# Patient Record
Sex: Female | Born: 1945 | Race: White | Hispanic: No | Marital: Married | Smoking: Never smoker
Health system: Southern US, Community
[De-identification: ages and names within clinical notes are randomized; demographics above are authoritative.]

## PROBLEM LIST (undated history)

## (undated) DIAGNOSIS — C7931 Secondary malignant neoplasm of brain: Secondary | ICD-10-CM

## (undated) DIAGNOSIS — N1330 Unspecified hydronephrosis: Secondary | ICD-10-CM

## (undated) DIAGNOSIS — C7951 Secondary malignant neoplasm of bone: Secondary | ICD-10-CM

## (undated) DIAGNOSIS — N390 Urinary tract infection, site not specified: Secondary | ICD-10-CM

## (undated) DIAGNOSIS — C7800 Secondary malignant neoplasm of unspecified lung: Secondary | ICD-10-CM

## (undated) DIAGNOSIS — C50919 Malignant neoplasm of unspecified site of unspecified female breast: Secondary | ICD-10-CM

## (undated) DIAGNOSIS — D649 Anemia, unspecified: Secondary | ICD-10-CM

## (undated) HISTORY — DX: Unspecified hydronephrosis: N13.30

## (undated) HISTORY — DX: Anemia, unspecified: D64.9

## (undated) HISTORY — DX: Urinary tract infection, site not specified: N39.0

## (undated) HISTORY — DX: Secondary malignant neoplasm of unspecified lung: C78.00

## (undated) HISTORY — DX: Secondary malignant neoplasm of brain: C79.31

## (undated) HISTORY — DX: Malignant neoplasm of unspecified site of unspecified female breast: C50.919

## (undated) HISTORY — DX: Secondary malignant neoplasm of bone: C79.51

---

## 1979-06-05 HISTORY — PX: MOUTH SURGERY: SHX715

## 2011-06-05 HISTORY — PX: CATARACT EXTRACTION: SUR2

## 2013-10-22 ENCOUNTER — Other Ambulatory Visit: Payer: Self-pay | Admitting: Family Medicine

## 2013-10-22 DIAGNOSIS — N63 Unspecified lump in unspecified breast: Secondary | ICD-10-CM

## 2013-10-23 ENCOUNTER — Ambulatory Visit
Admission: RE | Admit: 2013-10-23 | Discharge: 2013-10-23 | Disposition: A | Discharge status: Home / Self Care | Payer: Medicare Other | Source: Ambulatory Visit | Attending: Family Medicine | Admitting: Family Medicine

## 2013-10-23 ENCOUNTER — Other Ambulatory Visit: Payer: Self-pay | Admitting: Family Medicine

## 2013-10-23 DIAGNOSIS — N63 Unspecified lump in unspecified breast: Secondary | ICD-10-CM

## 2013-10-27 ENCOUNTER — Ambulatory Visit
Admission: RE | Admit: 2013-10-27 | Discharge: 2013-10-27 | Disposition: A | Discharge status: Home / Self Care | Payer: Medicare Other | Source: Ambulatory Visit | Attending: Family Medicine | Admitting: Family Medicine

## 2013-10-27 ENCOUNTER — Other Ambulatory Visit: Payer: Self-pay | Admitting: Family Medicine

## 2013-10-27 DIAGNOSIS — N63 Unspecified lump in unspecified breast: Secondary | ICD-10-CM

## 2013-10-27 DIAGNOSIS — C50919 Malignant neoplasm of unspecified site of unspecified female breast: Secondary | ICD-10-CM

## 2013-10-28 ENCOUNTER — Telehealth: Payer: Self-pay | Admitting: *Deleted

## 2013-10-28 DIAGNOSIS — C50412 Malignant neoplasm of upper-outer quadrant of left female breast: Secondary | ICD-10-CM

## 2013-10-28 NOTE — Telephone Encounter (Signed)
Received call back from patient and confirmed [facility] appointment for 11/04/13 at 12pm.  Instructions and contact information given.

## 2013-10-28 NOTE — Telephone Encounter (Signed)
Left message for a return phone call to schedule patient for [facility] 11/04/13.  Awaiting patient response.

## 2013-11-02 ENCOUNTER — Ambulatory Visit
Admission: RE | Admit: 2013-11-02 | Discharge: 2013-11-02 | Disposition: A | Discharge status: Home / Self Care | Payer: Medicare Other | Source: Ambulatory Visit | Attending: Family Medicine | Admitting: Family Medicine

## 2013-11-02 DIAGNOSIS — C50919 Malignant neoplasm of unspecified site of unspecified female breast: Secondary | ICD-10-CM

## 2013-11-02 MED ORDER — GADOBENATE DIMEGLUMINE 529 MG/ML IV SOLN
11.0000 mL | Freq: Once | INTRAVENOUS | Status: AC | PRN
  Administered 2013-11-02: 11 mL via INTRAVENOUS

## 2013-11-04 ENCOUNTER — Encounter: Payer: Self-pay | Admitting: Specialist

## 2013-11-04 ENCOUNTER — Ambulatory Visit (HOSPITAL_BASED_OUTPATIENT_CLINIC_OR_DEPARTMENT_OTHER): Payer: Medicare Other | Admitting: Oncology

## 2013-11-04 ENCOUNTER — Other Ambulatory Visit: Payer: Self-pay | Admitting: *Deleted

## 2013-11-04 ENCOUNTER — Ambulatory Visit: Payer: Medicare Other | Attending: General Surgery | Admitting: Physical Therapy

## 2013-11-04 ENCOUNTER — Encounter: Payer: Self-pay | Admitting: Oncology

## 2013-11-04 ENCOUNTER — Ambulatory Visit
Admission: RE | Admit: 2013-11-04 | Discharge: 2013-11-04 | Disposition: A | Discharge status: Home / Self Care | Payer: Medicare Other | Source: Ambulatory Visit | Attending: Radiation Oncology | Admitting: Radiation Oncology

## 2013-11-04 ENCOUNTER — Ambulatory Visit (HOSPITAL_BASED_OUTPATIENT_CLINIC_OR_DEPARTMENT_OTHER): Payer: Medicare Other | Admitting: General Surgery

## 2013-11-04 ENCOUNTER — Ambulatory Visit: Payer: Medicare Other

## 2013-11-04 ENCOUNTER — Other Ambulatory Visit (HOSPITAL_BASED_OUTPATIENT_CLINIC_OR_DEPARTMENT_OTHER): Payer: Medicare Other

## 2013-11-04 ENCOUNTER — Telehealth: Payer: Self-pay | Admitting: Oncology

## 2013-11-04 VITALS — BP 152/93 | HR 103 | Temp 98.4°F | Resp 18 | Ht 66.0 in | Wt 123.4 lb

## 2013-11-04 DIAGNOSIS — C779 Secondary and unspecified malignant neoplasm of lymph node, unspecified: Secondary | ICD-10-CM

## 2013-11-04 DIAGNOSIS — C50412 Malignant neoplasm of upper-outer quadrant of left female breast: Secondary | ICD-10-CM

## 2013-11-04 DIAGNOSIS — C50419 Malignant neoplasm of upper-outer quadrant of unspecified female breast: Secondary | ICD-10-CM

## 2013-11-04 DIAGNOSIS — Z170 Estrogen receptor positive status [ER+]: Secondary | ICD-10-CM

## 2013-11-04 DIAGNOSIS — C50919 Malignant neoplasm of unspecified site of unspecified female breast: Secondary | ICD-10-CM

## 2013-11-04 DIAGNOSIS — C773 Secondary and unspecified malignant neoplasm of axilla and upper limb lymph nodes: Secondary | ICD-10-CM

## 2013-11-04 DIAGNOSIS — IMO0001 Reserved for inherently not codable concepts without codable children: Secondary | ICD-10-CM | POA: Insufficient documentation

## 2013-11-04 DIAGNOSIS — M25519 Pain in unspecified shoulder: Secondary | ICD-10-CM | POA: Diagnosis not present

## 2013-11-04 LAB — COMPREHENSIVE METABOLIC PANEL (CC13)
ALBUMIN: 3.6 g/dL (ref 3.5–5.0)
ALT: 59 U/L — ABNORMAL HIGH (ref 0–55)
AST: 119 U/L — ABNORMAL HIGH (ref 5–34)
Alkaline Phosphatase: 234 U/L — ABNORMAL HIGH (ref 40–150)
Anion Gap: 19 mEq/L — ABNORMAL HIGH (ref 3–11)
BILIRUBIN TOTAL: 0.64 mg/dL (ref 0.20–1.20)
BUN: 24.1 mg/dL (ref 7.0–26.0)
CO2: 19 mEq/L — ABNORMAL LOW (ref 22–29)
Calcium: 9.9 mg/dL (ref 8.4–10.4)
Chloride: 101 mEq/L (ref 98–109)
Creatinine: 1.3 mg/dL — ABNORMAL HIGH (ref 0.6–1.1)
Glucose: 115 mg/dl (ref 70–140)
Potassium: 3.9 mEq/L (ref 3.5–5.1)
SODIUM: 139 meq/L (ref 136–145)
Total Protein: 7.6 g/dL (ref 6.4–8.3)

## 2013-11-04 LAB — CBC WITH DIFFERENTIAL/PLATELET
BASO%: 1.3 % (ref 0.0–2.0)
Basophils Absolute: 0.1 10*3/uL (ref 0.0–0.1)
EOS ABS: 0 10*3/uL (ref 0.0–0.5)
EOS%: 0.7 % (ref 0.0–7.0)
HCT: 34.6 % — ABNORMAL LOW (ref 34.8–46.6)
HEMOGLOBIN: 11.2 g/dL — AB (ref 11.6–15.9)
LYMPH%: 19.2 % (ref 14.0–49.7)
MCH: 31.3 pg (ref 25.1–34.0)
MCHC: 32.3 g/dL (ref 31.5–36.0)
MCV: 96.9 fL (ref 79.5–101.0)
MONO#: 0.7 10*3/uL (ref 0.1–0.9)
MONO%: 12 % (ref 0.0–14.0)
NEUT#: 3.9 10*3/uL (ref 1.5–6.5)
NEUT%: 66.8 % (ref 38.4–76.8)
Platelets: 396 10*3/uL (ref 145–400)
RBC: 3.57 10*6/uL — AB (ref 3.70–5.45)
RDW: 14.6 % — ABNORMAL HIGH (ref 11.2–14.5)
WBC: 5.9 10*3/uL (ref 3.9–10.3)
lymph#: 1.1 10*3/uL (ref 0.9–3.3)

## 2013-11-04 MED ORDER — LORAZEPAM 0.5 MG PO TABS: 0.5000 mg | ORAL_TABLET | Freq: Once | ORAL | Status: DC

## 2013-11-04 NOTE — Progress Notes (Signed)
[place]  [phone] Fax:(336) [phone]     ID: [name] OB: Dec 28, 1945  MR#: [id number]  KDP#:[id number]  PCP: Lilian Coma, MD GYN:   SU: Excell Seltzer OTHER MD: Arloa Koh, Altamese Cabal, Scot Minor DDS  CHIEF COMPLAINT: "I have breast cancer"  BREAST CANCER HISTORY: Lynnel noted a change in her left breast approximately mid 2014. This was a flattening of the lateral aspect of the left breast. Eventually she noted an indentation and eventually a "sore" in that area. She had not shared this information with her husband, and she did not have a primary care physician. More recently, as she started to have symptoms related to this, she mentioned it to her husband Vicki Marshall and she establish yourself with Dr. Stephanie Acre, who set her up for bilateral diagnostic mammography and ultrasonography at the [facility] 10/23/2013.. This showed a breast density to be category C. The right breast was negative.  The left breast contained an ulcerated mass laterally measuring at least 4 cm mammography. On physical exam there was an ulcerated mass in the 2:00 location of the left breast, associated with erythematous skin nodules in the upper inner quadrant. The left breast was smaller than the right, and firm. There were palpable left axillary lymph nodes. In addition, on the right upper chest wall, infraclavicular like, there was a firm nodule separate from the bony structures. There was also a right preauricular node and a firm left submandibular node.  Ultrasound showed multiple nodules throughout the lower portion of the left breast. There was a large mass in the upper outer quadrant of the left breast contiguous with the skin ulceration, measuring greater than 5 cm. The left axilla showed at least 3 suspicious masses, the largest measuring 1.9 cm. Ultrasound of the palpable abnormality of the right chest wall showed a superficial irregular mass, subdermal,  measuring 0.8 cm.  Biopsies of the left breast mass, a left axillary lymph node, and the right infra-clavicular mass on 10/23/2013, all showed ([id number]) and invasive ductal carcinoma, grade 1 or 2, estrogen receptor 100% positive, progesterone receptor 23% positive, both with strong staining intensity, with an MIB-1 of 61%, and no HER-2 amplification, the signals ratio being 0.90, and the number per cell 1.80.  On 11/02/2013 the patient underwent bilateral breast MRI and this showed, in the left breast, a 5.8 cm mass eroding through the overlying skin, and also invading the underlying pectoralis muscle and chest wall. There were multiple enhancing nodules throughout the left breast and an adjacent 4.8 cm irregular enhancing mass inferiorly. There are multiple large irregular left axillary lymph nodes, the largest measuring 2.4 cm. There was a 0.9 cm left subpectoral lymph node as well as prevascular and precarinal lymph nodes.  In the right breast far upper inner quadrant there was a 0.8 cm irregular enhancing mass abutting the overlying skin. This is the mass that was biopsied and shown to be positive. In addition there was patchy heterogeneous enhancement of the sternum.  The patient's subsequent history is as detailed below  INTERVAL HISTORY: Vicki Marshall was evaluated in the multidisciplinary breast cancer clinic 11/04/2013 accompanied by her husband Vicki Marshall. Her case was also discussed in breast conference at that same morning  REVIEW OF SYSTEMS: Aside from problems directly related to the left breast mass and skin involvement, she has lost approximately 5 pounds over the last 2 months. She feels a little bit more fatigued than before. She continues all her daily activities, however. She has  slept on 2 pillows for several years. She says her appetite has been poor, but denies nausea vomiting or significant taste perversion. There have been some visual changes. Otherwise a detailed review of systems  today was noncontributory   PAST MEDICAL HISTORY: Past Medical History  Diagnosis Date  . Urinary tract infection   . Anemia     PAST SURGICAL HISTORY: Past Surgical History  Procedure Laterality Date  . Cataract extraction  2013  . Mouth surgery  1981    FAMILY HISTORY: The patient's father died at the age of 32, from pneumonia. The patient's mother lived to be 33. The patient had no brothers, one sister. There is no history of breast or ovarian cancer in the family  GYNECOLOGIC HISTORY:  Menarche age 44. The patient is GX P0. She stopped having periods approximately 2003. She did not take hormone replacement. She used birth control pills for approximately 8 years remotely, with no complications   SOCIAL HISTORY:  Rush Barer is retired about a year ago. 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 Her husband Vicki Marshall is retired from working in Char[employer] fundraiser. They live alone, with no pets.    ADVANCED DIRECTIVES: Not in place   HEALTH MAINTENANCE: History  Substance Use Topics  . Smoking status: Never Smoker   . Smokeless tobacco: Not on file  . Alcohol Use: Yes     Comment: 10 12 glasses/week     Colonoscopy: Never  PAP: 1990  Bone density: Never  Lipid panel:  No Known Allergies  Current Outpatient Prescriptions  Medication Sig Dispense Refill  . aspirin 81 MG EC tablet Take 81 mg by mouth daily. Swallow whole.       No current facility-administered medications for this visit.    OBJECTIVE: Middle-aged white woman in no acute distress  Filed Vitals:   11/04/13 1300  BP: 152/93  Pulse: 103  Temp: 98.4 F (36.9 C)  Resp: 18     Body mass index is 19.93 kg/(m^2).    ECOG FS:1 - Symptomatic but completely ambulatory  Ocular: Sclerae unicteric, pupils equal, round and reactive to light Ear-nose-throat: Oropharynx clear,  followup replacement, partial lower plate  Lymphatic:  there is a 2 cm subcutaneous mass in the right anterior cervical  triangle, likely a subcutaneous met  Lungs no rales or rhonchi, good excursion bilaterally Heart regular rate and rhythm, no murmur appreciated Abd soft, nontender, positive bowel sounds MSK no focal spinal tenderness, no joint edema Neuro: non-focal, well-oriented, appropriate affect Breasts: I do not palpate any masses in the right breast itself. There are no skin or nipple changes of concern. The right axilla is benign. The left breast is considerably smaller than the right. It is not easily movable. There is dimpling and indentation of the lateral silhouette, associated with an erythematous nodule measuring approximately 2-1/2 cm in the upper outer quadrant. There is fixed left axillary adenopathy.   LAB RESULTS:  CMP     Component Value Date/Time   NA 139 11/04/2013 1217   K 3.9 11/04/2013 1217   CO2 19* 11/04/2013 1217   GLUCOSE 115 11/04/2013 1217   BUN 24.1 11/04/2013 1217   CREATININE 1.3* 11/04/2013 1217   CALCIUM 9.9 11/04/2013 1217   PROT 7.6 11/04/2013 1217   ALBUMIN 3.6 11/04/2013 1217   AST 119* 11/04/2013 1217   ALT 59* 11/04/2013 1217   ALKPHOS 234* 11/04/2013 1217   BILITOT 0.64 11/04/2013 1217    I No results found  for this basename: SPEP, UPEP,  kappa and lambda light chains    Lab Results  Component Value Date   WBC 5.9 11/04/2013   NEUTROABS 3.9 11/04/2013   HGB 11.2* 11/04/2013   HCT 34.6* 11/04/2013   MCV 96.9 11/04/2013   PLT 396 11/04/2013      Chemistry      Component Value Date/Time   NA 139 11/04/2013 1217   K 3.9 11/04/2013 1217   CO2 19* 11/04/2013 1217   BUN 24.1 11/04/2013 1217   CREATININE 1.3* 11/04/2013 1217      Component Value Date/Time   CALCIUM 9.9 11/04/2013 1217   ALKPHOS 234* 11/04/2013 1217   AST 119* 11/04/2013 1217   ALT 59* 11/04/2013 1217   BILITOT 0.64 11/04/2013 1217       No results found for this basename: LABCA2    No components found with this basename: LABCA125    No results found for this basename: INR,  in the last 168 hours  Urinalysis No results  found for this basename: colorurine, appearanceur, labspec, phurine, glucoseu, hgbur, bilirubinur, ketonesur, proteinur, urobilinogen, nitrite, leukocytesur    STUDIES: Mr Breast Bilateral W Wo Contrast  11/02/2013   CLINICAL DATA:  Patient with recent diagnosis left breast carcinoma and metastatic left axillary lymphadenopathy. Additionally there is a palpable nodule within the right chest wall which demonstrated invasive ductal carcinoma.  EXAM: BILATERAL BREAST MRI WITH AND WITHOUT CONTRAST  TECHNIQUE: Multiplanar, multisequence MR images of both breasts were obtained prior to and following the intravenous administration of 58m of MultiHance.  THREE-DIMENSIONAL MR IMAGE RENDERING ON INDEPENDENT WORKSTATION:  Three-dimensional MR images were rendered by post-processing of the original MR data on an independent workstation. The three-dimensional MR images were interpreted, and findings are reported in the following complete MRI report for this study. Three dimensional images were evaluated at the independent DynaCad workstation  COMPARISON:  Previous exams  FINDINGS: Breast composition: c:  Heterogeneous fibroglandular tissue  Background parenchymal enhancement: Mild  Right breast: Within the far upper inner aspect of the right breast there is a 0.8 x 0.8 cm irregular enhancing mass which appears to abut the overlying skin and underlying muscle. This mass was previously biopsied and compatible with invasive ductal carcinoma.  No additional concerning enhancing masses identified within the right breast.  Left breast: Within the lateral aspect of the left breast there is a 5.8 x 4.8 x 4.6 cm heterogeneously enhancing mass with associated susceptibility artifact from biopsy marking clip compatible with biopsy-proven left breast carcinoma. This mass invades and erodes through the overlying skin. Additionally the posterior margin of this mass invades the underlying pectoralis muscle and chest wall.  There are  multiple additional enhancing nodules demonstrated throughout the left breast as well as involving the skin of the left breast. Reference skin nodule measures 3.0 x 0.9 cm (image 68 ; series 6). Along the inferior margin of dominant mass is an adjacent 4.8 x 1.4 x 2.8 cm irregular enhancing mass compatible with additional site of disease.  Along the inferior lateral margin of the breast there is an additional 1.4 x 0.7 cm irregular enhancing mass which appears to involve the underlying pectoralis muscle. There is an additional enhancing mass which appears to involve the pectoralis muscle within the far inferior left lateral aspect of the breast measuring 9 mm (image 141; series 7).  Extensive left breast skin thickening and edema.  Lymph nodes: Multiple enlarged irregular left axillary lymph nodes. A dominant left axillary lymph node  measures 2.4 x 1.9 cm. There is 0.9 cm left subpectoral lymph node. Note is made of a 1 cm indeterminate prevascular lymph node. There is a 1 cm precarinal lymph node (image 64; series 12).  Ancillary findings: Ectasia of ascending thoracic aorta measuring 3.8 cm. Patchy heterogeneous enhancement of the sternum.  IMPRESSION: 1. Large dominant irregular enhancing left breast mass which invades and erodes through the overlying skin and involves the underlying pectoralis muscle/chest wall compatible with biopsy-proven carcinoma. 2. Multiple additional enhancing nodules throughout the left breast, involving the skin of the left breast and the left pectoralis muscle compatible with additional sites of disease. 3. Bulky enlarged left axillary lymphadenopathy compatible with biopsy-proven metastatic adenopathy. 4. Irregular enhancing mass within the far superior aspect of the right breast/chest wall involving the overlying skin and underlying pectoralis muscle compatible with biopsy-proven carcinoma. 5. Heterogeneous patchy enhancement of the sternum, osseous metastasis not excluded. 6.  Borderline enlarged mediastinal lymphadenopathy. 7. Ectasia of the ascending thoracic aorta.  RECOMMENDATION: 1. Treatment plan for metastatic left breast carcinoma and metastatic nodule within the superior aspect of the right breast/chest wall. 2. PET/CT for evaluation of distant metastatic disease, confirmation of borderline enlarged mediastinal lymph nodes and for evaluation of possible osseous metastasis.  BI-RADS CATEGORY  6: Known biopsy-proven malignancy.   Electronically Signed   By: Lovey Newcomer M.D.   On: 11/02/2013 16:57   Mm Digital Diagnostic Bilat  10/23/2013   CLINICAL DATA:  Ulcerating mass in the left breast. This has been present for some time, initially noticed as a mass in the upper-outer quadrant.  EXAM: DIGITAL DIAGNOSTIC  BILATERAL MAMMOGRAM WITH CAD  ULTRASOUND BILATERAL BREAST  COMPARISON:  None.  ACR Breast Density Category c: The breast tissue is heterogeneously dense, which may obscure small masses.  FINDINGS: Right breast is negative.  Left breast is small A contains an ulcerated mass in the lateral aspect. A partially imaged lobulated mass measures at least 4 cm mammographically.  Mammographic images were processed with CAD.  On physical exam, there is an ulcerating mass in the 2 o'clock location of the left breast approximately 4 cm in diameter. Erythematous skin nodules are identified in the upper inner quadrant of the breast. Left breast is small and diffusely firm. I palpate left axillary lymph nodes.  On the right upper chest wall, in the infraclavicular region there is a firm nodule discrete from the bony structures. I palpate a firm right preauricular node and a firm left submandibular node.  Ultrasound is performed, showing multiple nodules throughout the lower portion of the left breast, heterogeneous in appearance. A large discrete vascular mass is identified in the upper-outer quadrant of the left breast, contiguous with the skin ulceration and measuring greater than 5 cm.  Evaluation of the left axilla shows 3 suspicious masses. The largest of these measures 1.9 x 1.5 x 1.3 cm.  Ultrasound of the palpable abnormality on the right chest wall shows a superficial irregular mass, subdermal in location and superficial to the pectoralis muscle. This measures 0.7 x 0.6 x 0.8 cm. Findings are suspicious for metastasis.  IMPRESSION: 1. Findings consistent with advanced left breast cancer. 2. Suspect left axillary metastatic disease. 3. Suspect right chest wall metastasis.  RECOMMENDATION: 1. Recommend ultrasound-guided core biopsy of mass in the left breast. 2. Recommend ultrasound-guided core biopsy of left axillary lymph node. 3. Recommend ultrasound-guided core biopsy of right chest wall mass for staging. 4. Biopsies are performed on the same day and dictated separately.  I have discussed the findings and recommendations with the patient and her husband. Results were also provided in writing at the conclusion of the visit. If applicable, a reminder letter will be sent to the patient regarding the next appointment.  BI-RADS CATEGORY  5: Highly suggestive of malignancy.   Electronically Signed   By: Shon Hale M.D.   On: 10/23/2013 13:31   Mm Digital Diagnostic Unilat L  10/23/2013   CLINICAL DATA:  Status post ultrasound-guided core biopsy of mass in the 2 o'clock location of the left breast.  EXAM: DIAGNOSTIC LEFT MAMMOGRAM POST ULTRASOUND BIOPSY  COMPARISON:  10/23/2013  FINDINGS: Mammographic images were obtained following ultrasound guided biopsy of mass in the 2 o'clock location of the left breast. A coil shaped clip is identified in the upper outer quadrant as expected.  IMPRESSION: 1. Tissue marker clip is in the expected location following ultrasound-guided core biopsy. 2. Left axillary node was biopsied but not marked with a clip. Right chest wall lesion was biopsied but not marked with a clip.  Final Assessment: Post Procedure Mammograms for Marker Placement   Electronically  Signed   By: Shon Hale M.D.   On: 10/23/2013 13:54   Mm Radiologist Eval And Mgmt  10/27/2013   EXAM: ESTABLISHED PATIENT OFFICE VISIT - LEVEL II ([id number])  CHIEF COMPLAINT: The patient returns for results following bilateral ultrasound-guided core biopsies.  Current Pain Level: 3  HISTORY OF PRESENT ILLNESS: The patient has noted a palpable left breast mass for some time. Recently this has become ulcerated. There is no family history of breast cancer or ovarian cancer.  EXAM: There is a firm palpable mass with ulceration located within the left breast at the 2 o'clock position. There is also palpable left axillary adenopathy and a small palpable mass within the right infraclavicular region. As per Dr. Saul Fordyce note there are small palpable masses in the right preauricular region and left submandibular region.  The biopsy sites within the left breast, left axilla, and superior right chest wall are examined. There is no hematoma formation or signs of infection. There is mild ecchymosis associated with these sites.  PATHOLOGY: The pathology associated with the left breast mass located at the 2 o'clock position demonstrated grade 1-2 invasive ductal carcinoma. The left axillary lymph node biopsy is consistent with metastatic invasive ductal carcinoma and the right chest wall ultrasound-guided core biopsy is also consistent with a metastatic deposit.  ASSESSMENT AND PLAN: ASSESSMENT AND PLAN I discussed the findings with the patient and her husband and answered their questions. The patient is scheduled for the breast cancer multi disciplinary clinic on 11/04/2013. Breast MRI is currently planned for 11/02/2013. The patient was encouraged to call our breast center for additional questions or concerns. Post biopsy wound care instructions were reviewed with the patient and her husband. Educational materials were shared with the patient.   Electronically Signed   By: Luberta Robertson M.D.   On: 10/27/2013 15:57   US  Breast Ltd Uni Left Inc Axilla  10/23/2013   CLINICAL DATA:  Ulcerating mass in the left breast. This has been present for some time, initially noticed as a mass in the upper-outer quadrant.  EXAM: DIGITAL DIAGNOSTIC  BILATERAL MAMMOGRAM WITH CAD  ULTRASOUND BILATERAL BREAST  COMPARISON:  None.  ACR Breast Density Category c: The breast tissue is heterogeneously dense, which may obscure small masses.  FINDINGS: Right breast is negative.  Left breast is small A contains an ulcerated mass in the lateral  aspect. A partially imaged lobulated mass measures at least 4 cm mammographically.  Mammographic images were processed with CAD.  On physical exam, there is an ulcerating mass in the 2 o'clock location of the left breast approximately 4 cm in diameter. Erythematous skin nodules are identified in the upper inner quadrant of the breast. Left breast is small and diffusely firm. I palpate left axillary lymph nodes.  On the right upper chest wall, in the infraclavicular region there is a firm nodule discrete from the bony structures. I palpate a firm right preauricular node and a firm left submandibular node.  Ultrasound is performed, showing multiple nodules throughout the lower portion of the left breast, heterogeneous in appearance. A large discrete vascular mass is identified in the upper-outer quadrant of the left breast, contiguous with the skin ulceration and measuring greater than 5 cm. Evaluation of the left axilla shows 3 suspicious masses. The largest of these measures 1.9 x 1.5 x 1.3 cm.  Ultrasound of the palpable abnormality on the right chest wall shows a superficial irregular mass, subdermal in location and superficial to the pectoralis muscle. This measures 0.7 x 0.6 x 0.8 cm. Findings are suspicious for metastasis.  IMPRESSION: 1. Findings consistent with advanced left breast cancer. 2. Suspect left axillary metastatic disease. 3. Suspect right chest wall metastasis.  RECOMMENDATION: 1. Recommend  ultrasound-guided core biopsy of mass in the left breast. 2. Recommend ultrasound-guided core biopsy of left axillary lymph node. 3. Recommend ultrasound-guided core biopsy of right chest wall mass for staging. 4. Biopsies are performed on the same day and dictated separately.  I have discussed the findings and recommendations with the patient and her husband. Results were also provided in writing at the conclusion of the visit. If applicable, a reminder letter will be sent to the patient regarding the next appointment.  BI-RADS CATEGORY  5: Highly suggestive of malignancy.   Electronically Signed   By: Shon Hale M.D.   On: 10/23/2013 13:31   US Breast Ltd Uni Right Inc Axilla  10/23/2013   CLINICAL DATA:  Ulcerating mass in the left breast. This has been present for some time, initially noticed as a mass in the upper-outer quadrant.  EXAM: DIGITAL DIAGNOSTIC  BILATERAL MAMMOGRAM WITH CAD  ULTRASOUND BILATERAL BREAST  COMPARISON:  None.  ACR Breast Density Category c: The breast tissue is heterogeneously dense, which may obscure small masses.  FINDINGS: Right breast is negative.  Left breast is small A contains an ulcerated mass in the lateral aspect. A partially imaged lobulated mass measures at least 4 cm mammographically.  Mammographic images were processed with CAD.  On physical exam, there is an ulcerating mass in the 2 o'clock location of the left breast approximately 4 cm in diameter. Erythematous skin nodules are identified in the upper inner quadrant of the breast. Left breast is small and diffusely firm. I palpate left axillary lymph nodes.  On the right upper chest wall, in the infraclavicular region there is a firm nodule discrete from the bony structures. I palpate a firm right preauricular node and a firm left submandibular node.  Ultrasound is performed, showing multiple nodules throughout the lower portion of the left breast, heterogeneous in appearance. A large discrete vascular mass is  identified in the upper-outer quadrant of the left breast, contiguous with the skin ulceration and measuring greater than 5 cm. Evaluation of the left axilla shows 3 suspicious masses. The largest of these measures 1.9 x 1.5 x 1.3 cm.  Ultrasound of  the palpable abnormality on the right chest wall shows a superficial irregular mass, subdermal in location and superficial to the pectoralis muscle. This measures 0.7 x 0.6 x 0.8 cm. Findings are suspicious for metastasis.  IMPRESSION: 1. Findings consistent with advanced left breast cancer. 2. Suspect left axillary metastatic disease. 3. Suspect right chest wall metastasis.  RECOMMENDATION: 1. Recommend ultrasound-guided core biopsy of mass in the left breast. 2. Recommend ultrasound-guided core biopsy of left axillary lymph node. 3. Recommend ultrasound-guided core biopsy of right chest wall mass for staging. 4. Biopsies are performed on the same day and dictated separately.  I have discussed the findings and recommendations with the patient and her husband. Results were also provided in writing at the conclusion of the visit. If applicable, a reminder letter will be sent to the patient regarding the next appointment.  BI-RADS CATEGORY  5: Highly suggestive of malignancy.   Electronically Signed   By: Shon Hale M.D.   On: 10/23/2013 13:31   [place] Lt Breast Bx W Loc Dev 1st Lesion Img Bx Spec US Guide  10/23/2013   CLINICAL DATA:  Left breast mass.  EXAM: ULTRASOUND GUIDED LEFT BREAST CORE NEEDLE BIOPSY WITH VACUUM ASSIST  COMPARISON:  10/23/2013  PROCEDURE: I met with the patient and we discussed the procedure of ultrasound-guided biopsy, including benefits and alternatives. We discussed the high likelihood of a successful procedure. We discussed the risks of the procedure including infection, bleeding, tissue injury, clip migration, and inadequate sampling. Informed written consent was given. The usual time-out protocol was performed immediately prior to the  procedure.  Using sterile technique and 2% Lidocaine as local anesthetic, under direct ultrasound visualization, a 12 gauge vacuum-assisteddevice was used to perform biopsy of mass in the 2 o'clock location of the left breast using a lateral approach. At the conclusion of the procedure, a coil shaped tissue marker clip was deployed into the biopsy cavity. Follow-up 2-view mammogram was performed and dictated separately.  IMPRESSION: Ultrasound-guided biopsy of left breast mass. No apparent complications.  Left axillary mass and right chest wall mass are also biopsied on the same day and dictated separately.   Electronically Signed   By: Shon Hale M.D.   On: 10/23/2013 13:50   [place] Lt Breast Bx W Loc Dev Ea Add Lesion Img Bx Spec US Guide  10/23/2013   CLINICAL DATA:  Left breast mass and enlarged left axillary lymph nodes.  EXAM: ULTRASOUND GUIDED CORE NEEDLE BIOPSY OF A LEFT AXILLARY NODE  COMPARISON:  10/23/2013  FINDINGS: I met with the patient and we discussed the procedure of ultrasound-guided biopsy, including benefits and alternatives. We discussed the high likelihood of a successful procedure. We discussed the risks of the procedure, including infection, bleeding, tissue injury, clip migration, and inadequate sampling. Informed written consent was given. The usual time-out protocol was performed immediately prior to the procedure.  Using sterile technique and 2% Lidocaine as local anesthetic, under direct ultrasound visualization, a 14 gauge spring-loaded device was used to perform biopsy of the lower most left axillary lymph node using a lateral approach. Biopsy site was not marked with a clip.  IMPRESSION: 1. Ultrasound guided biopsy of left axillary lymph node. No apparent complications. 2. Left breast mass and right chest wall mass are also biopsied on the same day, dictated separately.   Electronically Signed   By: Shon Hale M.D.   On: 10/23/2013 13:52   [place] Rt Breast Bx W Loc Dev 1st Lesion Img  Bx Spec  US Guide  10/23/2013   CLINICAL DATA:  Right chest wall mass.  EXAM: ULTRASOUND GUIDED RIGHT BREAST CORE NEEDLE BIOPSY  COMPARISON:  10/23/2013  FINDINGS: I met with the patient and we discussed the procedure of ultrasound-guided biopsy, including benefits and alternatives. We discussed the high likelihood of a successful procedure. We discussed the risks of the procedure, including infection, bleeding, tissue injury, clip migration, and inadequate sampling. Informed written consent was given. The usual time-out protocol was performed immediately prior to the procedure.  Using sterile technique and 2% Lidocaine as local anesthetic, under direct ultrasound visualization, a 14 gauge spring-loaded device was used to perform biopsy of mass in the infraclavicular region of the right chest wall using a median approach. The lesion was not marked with a clip at the conclusion of biopsy.  IMPRESSION: 1. Ultrasound guided biopsy of right chest wall mass. No apparent complications. 2. Left breast mass and left axillary mass are also biopsied on the same day, dictated separately.   Electronically Signed   By: Shon Hale M.D.   On: 10/23/2013 13:53    ASSESSMENT: 68 y.o. [place] woman status post left breast in left axillary lymph node biopsy as well as right infraclavicular chest wall biopsy 10/23/2013 for a clinical T3, N2, M1, stage IV invasive ductal carcinoma, grade 2, estrogen receptor 100% positive, progesterone receptor 23% positive, with an MIB-1 of 61%, and no HER-2 amplification.  PLAN: We spent the better part of today's hour-long appointment discussing the biology of breast cancer in general, and the specifics of the patient's tumor in particular. [name] understands at this point she almost certainly has stage IV, metastatic disease. The right infraclavicular biopsy and establishes that. The real question however is whether there is involvement of the liver lungs, or other major organs,  including the brain.  unfortunately the elevated liver function tests do suggest liver involvement. Accordingly she is being set up for a PET scan and brain MRI.  We discussed the fact that stage IV breast cancer is not curable at present. He can be successfully treated and controlled sometimes for long periods of time. Systemic treatment takes precedence in her case. Surgery at this point is not possible. I am hopeful it will be possible at some point in the future and then of course  she would need postmastectomy radiation.  The big decision is whether to start with chemotherapy or start with anti-estrogens. That will depend on her staging studies. If there is significant involvement of a vital organ, or visceral crisis, we will with chemotherapy, and after obtaining good control switching to anti-estrogens. Otherwise we will start with letrozole and palbociclib Leslee Home). We have set up the appropriate studies and [name] is going to return to see me June 12 to discuss results and make a definitive decision.  She has a good understanding of the overall plan. She agrees with it. She knows the goal of treatment in her case is control. She will call with any problems that may develop before her next visit here.   Chauncey Cruel, MD   11/04/2013 2:20 PM

## 2013-11-04 NOTE — Telephone Encounter (Signed)
Pt has her schedule with the pet scan mri brain appt and the f/u vist with dr Jana Hakim

## 2013-11-04 NOTE — Progress Notes (Signed)
Patient and hubby came by. I explained the [place] and once plan of care is given--could be asst. They will call me if any issues after insurance pays

## 2013-11-04 NOTE — Progress Notes (Signed)
Saw patient in [facility]. Went over the distress screen with her as well as providing education on the support center programs and services. Patient said most of her distress was related to lack of information about the treatment and she felt much better after the clinic today.  She felt she had what she needed after today.  Vicki Gore, PhD, [place]

## 2013-11-04 NOTE — Progress Notes (Signed)
[facility] NEW PATIENT EVALUATION  Name: Vicki Marshall MRN: [id number]  Date:   11/04/2013           DOB: 07-08-1945  Status: outpatient   CC: Lilian Coma, MD  Hox[place], Darene Lamer, MD    REFERRING PHYSICIAN: Excell Seltzer Darene Lamer, MD   DIAGNOSIS: Stage IV (T4c, N2, M1) invasive ductal carcinoma of the left breast   HISTORY OF PRESENT ILLNESS:  Vicki Marshall is a 68 y.o. female who is seen at the [facility] through the courtesy of Dr. 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 She states that she first noted changes along her left breast one to 2 years ago. She was referred to the [facility] by Dr. Jonathon Jordan where she underwent mammography and ultrasonography. She was noted to have an ulcerating mass along the left aspect of the breast at approximately 2:00. Erythematous skin nodules were noted along the upper-outer quadrant, and there were fixed left axillary lymph nodes. There is also a small nodule noted along the right infraclavicular region in addition to a right preauricular and left submandibular node. Ultrasound showed a 5 cm mass with contiguous skin ulceration. There were multiple axillary masses the largest of which measured 1.9 x 1.5 x 1.3 cm. The left breast mass and axilla were biopsied on 10/23/2013 and these were diagnostic for invasive ductal carcinoma. The right chest wall, infraclavicular nodular was also biopsied and found to represent invasive ductal carcinoma. The tumor was ER/PR positive and HER-2/neu negative. Proliferation index was 61%. Breast MR on 11/02/2013 showed a large dominant mass within the left breast invading and eroding through the overlying skin and also involving the underlying pectoralis muscle/chest wall. There were multiple enhancing nodules throughout the left breast involving the skin the left breast and also a left pectoralis muscle. Bulky left axillary adenopathy was  seen. An irregular enhancing mass was seen within the far superior aspect of the right breast/chest wall involving overlying skin and underlying pectoralis muscle. Patchy enhancement was seen along the sternum suspicious for metastatic disease. She denies chest wall/breast pain. She reports moderate fatigue for the past 3-5 months along with a 5-6 pound weight loss over the past month.  PREVIOUS RADIATION THERAPY: No   PAST MEDICAL HISTORY:  has a past medical history of Urinary tract infection and Anemia.     PAST SURGICAL HISTORY:  Past Surgical History  Procedure Laterality Date  . Cataract extraction  2013  . Mouth surgery  1981     FAMILY HISTORY: family history is not on file. Her father died from aspiration pneumonia and 81 and her mother died from congestive heart failure and 82. No family history of breast cancer.   SOCIAL HISTORY:  reports that she has never smoked. She does not have any smokeless tobacco history on file. She reports that she drinks alcohol. She reports that she does not use illicit drugs. Married, no children. 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   ALLERGIES: Review of patient's allergies indicates no known allergies.   MEDICATIONS:  Current Outpatient Prescriptions  Medication Sig Dispense Refill  . aspirin 81 MG EC tablet Take 81 mg by mouth daily. Swallow whole.       No current facility-administered medications for this encounter.     REVIEW OF SYSTEMS:  Pertinent items are noted in HPI.    PHYSICAL EXAM: Alert and oriented 68 year old white female appearing her  stated age. Wt Readings from Last 3 Encounters:  11/04/13 123 lb 6.4 oz (55.974 kg)   Temp Readings from Last 3 Encounters:  11/04/13 98.4 F (36.9 C) Oral   BP Readings from Last 3 Encounters:  11/04/13 152/93   Pulse Readings from Last 3 Encounters:  11/04/13 103   Head and neck examination: Grossly unremarkable. Nodes: Without palpable cervical or  supraclavicular lymphadenopathy. With her left axilla there are matted/fixed axillary lymph nodes. Chest: Lungs clear. Breasts: There is a 3.5 x 4.0 cm ulcerative mass along the upper outer quadrant of the left breast centered at 2:00. There is a mass deep to this which is fixed to the chest wall. There is architectural distortion of the breast and nipple. 6 cm to the right of the mid sternal line is a 7 mm nodule  3 cm inferior to the clavicle. The right breast is without masses or lesions. Abdomen without hepatomegaly. Back: Without palpable spinal discomfort. Extremities: Without edema.    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 She needs to complete staging workup with a PET scan. Dr. Jana Hakim also recommends a brain scan. She appears to have treatable but incurable metastatic breast cancer. Our goal is to control distant disease and also local regional disease. We discussed neoadjuvant anti-estrogen therapy or chemotherapy depending on her extent and location of metastatic disease. Hopefully, she'll be a candidate for a "toilet mastectomy" and perhaps axillary dissection following neoadjuvant therapy. She knows that this may take 6 months to a year. She may be a candidate for local regional radiotherapy regardless of her response to systemic therapy. We discussed the potential acute and late toxicities of possible radiation therapy. I would be more than happy to see her in the future to discuss possible radiation therapy.   PLAN: As discussed above.  I spent 30 minutes was  spent face to face with the patient and more than 50% of that time was spent in counseling and/or coordination of care.

## 2013-11-04 NOTE — Progress Notes (Signed)
Subjective:   new diagnosis cancer of the left breast  Patient ID: Vicki Marshall, female   DOB: 1945/08/06, 68 y.o.   MRN: [id number]  HPI Patient is a pleasant 68 year old female referred by Dr. Nolon Nations is a new diagnosis of advanced stage breast cancer. The patient states that she has noted changes in her left breast for at least a year. She essentially just kept putting off evaluation although she was aware of progression. She recently presented for evaluation. She has noted progressive deformity and skin change of the left breast and more recently some skin breakdown and small amount of drainage daily. She recently presented to the breast center for imaging and workup. Mammogram revealed a contracted left breast with a central ulcerated mass measuring at least 4 cm  Mammographically. The right breast was negative. Physical exam at that time showed a large ulcerated mass in the breast with palpable adenopathy as well as a firm nodule in the infraclavicular area in the upper right chest. Ultrasound showed multiple nodules throughout the lower portion of the left breast and a large vascular mass in the upper outer quadrant left breast as well as multiple enlarged left axillary lymph nodes. Subsequently ultrasound-guided core biopsy was performed of the left breast mass, left axilla and right chest wall mass. Pathology revealed invasive grade 1-2 ductal carcinoma, ER/PR positive, HER-2-negative. Left axillary lymph node biopsy and right chest wall biopsy showed metastatic ductal carcinoma. Subsequent bilateral breast MR showed a large dominant left breast mass which invades any roots through the skin and also involves the underlying pectoralis muscle down to the level of the pleura. Multiple other nodules were noted in the breast as well as both the left axillary adenopathy and an irregular enhancing mass over the right anterior chest wall involving the pectoralis muscle and patchy enhancement of the  sternum a concern for metastatic disease. The patient has been feeling a little fatigued with lack of appetite in recent weeks but denies any severe pain.  Past Medical History  Diagnosis Date  . Urinary tract infection   . Anemia    ' Past Surgical History  Procedure Laterality Date  . Cataract extraction  2013  . Mouth surgery  1981   Current Outpatient Prescriptions  Medication Sig Dispense Refill  . aspirin 81 MG EC tablet Take 81 mg by mouth daily. Swallow whole.       No current facility-administered medications for this visit.   No Known Allergies   Review of Systems  Constitutional: Positive for appetite change and fatigue.  Respiratory: Negative.   Cardiovascular: Negative.   Gastrointestinal: Negative.   Musculoskeletal: Negative.        Objective:   Physical Exam General: Alert, Thin Caucasian female, in no distress Skin: Warm and dry without rash or infection. HEENT: No palpable masses or thyromegaly. Sclera nonicteric. Pupils equal round and reactive. Oropharynx clear. Breasts: The left breast is shrunken with a large central firm mass that to my exam does not actually seemed firmly fixed to the chest wall. There is a several centimeter ulceration over the lateral aspect of the breast that 2:00 as well as multiple firm erythematous skin nodules centrally over the breast. There is bulky fixed left axillary adenopathy. No masses in the right breast but in the right infraclavicular area is a 2-3 cm firm fixed mass. No cervical or supraclavicular nodes palpable Lungs: Breath sounds clear and equal without increased work of breathing Cardiovascular: Regular rate and rhythm without murmur. No  JVD or edema. Peripheral pulses intact. Abdomen: Nondistended. Soft and nontender. No masses palpable. No organomegaly. No palpable hernias. Extremities: No edema or joint swelling or deformity. No chronic venous stasis changes. Neurologic: Alert and fully oriented. Gait normal.     Assessment:     New diagnosis of stage IV cancer of the left breast, ER PR positive, HER-2/neu negative. She has locally advanced disease in the left breast with skin involvement and by MRI invasion of the chest wall as well as bulky fixed adenopathy. She has documented  Apparent metastatic disease on the right anterior chest wall and a concern for bony metastases. The patient would be a candidate for initial systemic treatment and hopefully if she gets a good response we could consider toilet mastectomy     Plan:     Patient is to undergo further metastatic workup with PET/CT. Possible hormonal treatment or chemotherapy based on these findings. She currently is not a candidate for mastectomy for the above reasons. I will plan to see her back in the office in followup during the course of her treatment after repeat MRI or at any point sooner if needed. I discussed all this with the patient and her husband in detail and her questions were answered. I told him be happy to speak with her at any point she had questions regarding surgical treatment will be happy to see her back in the office sooner if she has any concerns.

## 2013-11-05 ENCOUNTER — Encounter (HOSPITAL_COMMUNITY)
Admission: RE | Admit: 2013-11-05 | Discharge: 2013-11-05 | Disposition: A | Discharge status: Home / Self Care | Payer: Medicare Other | Source: Ambulatory Visit | Attending: Oncology | Admitting: Oncology

## 2013-11-05 ENCOUNTER — Ambulatory Visit (HOSPITAL_COMMUNITY)
Admission: RE | Admit: 2013-11-05 | Discharge: 2013-11-05 | Disposition: A | Discharge status: Home / Self Care | Payer: Medicare Other | Source: Ambulatory Visit | Attending: Oncology | Admitting: Oncology

## 2013-11-05 DIAGNOSIS — C7952 Secondary malignant neoplasm of bone marrow: Secondary | ICD-10-CM

## 2013-11-05 DIAGNOSIS — G319 Degenerative disease of nervous system, unspecified: Secondary | ICD-10-CM | POA: Insufficient documentation

## 2013-11-05 DIAGNOSIS — C7951 Secondary malignant neoplasm of bone: Secondary | ICD-10-CM | POA: Insufficient documentation

## 2013-11-05 DIAGNOSIS — D1802 Hemangioma of intracranial structures: Secondary | ICD-10-CM | POA: Insufficient documentation

## 2013-11-05 DIAGNOSIS — C50412 Malignant neoplasm of upper-outer quadrant of left female breast: Secondary | ICD-10-CM

## 2013-11-05 DIAGNOSIS — K7689 Other specified diseases of liver: Secondary | ICD-10-CM | POA: Insufficient documentation

## 2013-11-05 DIAGNOSIS — N1330 Unspecified hydronephrosis: Secondary | ICD-10-CM | POA: Insufficient documentation

## 2013-11-05 DIAGNOSIS — C50919 Malignant neoplasm of unspecified site of unspecified female breast: Secondary | ICD-10-CM | POA: Insufficient documentation

## 2013-11-05 DIAGNOSIS — R918 Other nonspecific abnormal finding of lung field: Secondary | ICD-10-CM | POA: Insufficient documentation

## 2013-11-05 LAB — GLUCOSE, CAPILLARY: Glucose-Capillary: 104 mg/dL — ABNORMAL HIGH (ref 70–99)

## 2013-11-05 MED ORDER — GADOBENATE DIMEGLUMINE 529 MG/ML IV SOLN
6.0000 mL | Freq: Once | INTRAVENOUS | Status: AC | PRN
  Administered 2013-11-05: 6 mL via INTRAVENOUS

## 2013-11-05 MED ORDER — FLUDEOXYGLUCOSE F - 18 (FDG) INJECTION
7.3000 | Freq: Once | INTRAVENOUS | Status: AC | PRN
  Administered 2013-11-05: 7.3 via INTRAVENOUS

## 2013-11-06 ENCOUNTER — Other Ambulatory Visit: Payer: Self-pay | Admitting: Oncology

## 2013-11-06 ENCOUNTER — Telehealth: Payer: Self-pay | Admitting: Oncology

## 2013-11-06 NOTE — Telephone Encounter (Signed)
, °

## 2013-11-06 NOTE — Progress Notes (Unsigned)
I have discussed Carolyns films w Rad On[facility] and SRS is a possibility; however with the cerebellar lesion measuring only 3 mm and asymptomatic and with our starting on anti estrogens I think it is OK to wait on SRS and see how she responds. Plan will be letrozole, Ibrance, zolendronate, MRI of liver for baseline, urology referral to investigate/resolve hydronephrosis, and repeat brain MRI 4 weeks into treatment.

## 2013-11-09 ENCOUNTER — Ambulatory Visit (HOSPITAL_BASED_OUTPATIENT_CLINIC_OR_DEPARTMENT_OTHER): Payer: Medicare Other | Admitting: Oncology

## 2013-11-09 VITALS — BP 162/82 | HR 98 | Temp 98.3°F | Resp 18 | Ht 66.0 in | Wt 122.8 lb

## 2013-11-09 DIAGNOSIS — C787 Secondary malignant neoplasm of liver and intrahepatic bile duct: Secondary | ICD-10-CM

## 2013-11-09 DIAGNOSIS — C50919 Malignant neoplasm of unspecified site of unspecified female breast: Secondary | ICD-10-CM | POA: Insufficient documentation

## 2013-11-09 DIAGNOSIS — C7800 Secondary malignant neoplasm of unspecified lung: Secondary | ICD-10-CM

## 2013-11-09 DIAGNOSIS — R911 Solitary pulmonary nodule: Secondary | ICD-10-CM

## 2013-11-09 DIAGNOSIS — C7949 Secondary malignant neoplasm of other parts of nervous system: Secondary | ICD-10-CM

## 2013-11-09 DIAGNOSIS — C50419 Malignant neoplasm of upper-outer quadrant of unspecified female breast: Secondary | ICD-10-CM

## 2013-11-09 DIAGNOSIS — C773 Secondary and unspecified malignant neoplasm of axilla and upper limb lymph nodes: Secondary | ICD-10-CM

## 2013-11-09 DIAGNOSIS — K7689 Other specified diseases of liver: Secondary | ICD-10-CM

## 2013-11-09 DIAGNOSIS — C7931 Secondary malignant neoplasm of brain: Secondary | ICD-10-CM

## 2013-11-09 DIAGNOSIS — C50412 Malignant neoplasm of upper-outer quadrant of left female breast: Secondary | ICD-10-CM

## 2013-11-09 DIAGNOSIS — Z170 Estrogen receptor positive status [ER+]: Secondary | ICD-10-CM

## 2013-11-09 DIAGNOSIS — C7952 Secondary malignant neoplasm of bone marrow: Secondary | ICD-10-CM

## 2013-11-09 DIAGNOSIS — N1330 Unspecified hydronephrosis: Secondary | ICD-10-CM | POA: Insufficient documentation

## 2013-11-09 DIAGNOSIS — C7951 Secondary malignant neoplasm of bone: Secondary | ICD-10-CM

## 2013-11-09 MED ORDER — PALBOCICLIB 125 MG PO CAPS: 125.0000 mg | ORAL_CAPSULE | Freq: Every day | ORAL | Status: DC

## 2013-11-09 MED ORDER — LETROZOLE 2.5 MG PO TABS
2.5000 mg | ORAL_TABLET | Freq: Every day | ORAL | Status: DC
Start: 2013-11-09 — End: 2014-06-07

## 2013-11-09 NOTE — Progress Notes (Signed)
[place]  Telephone:([phone] Fax:([phone]) [phone]     ID: [name] OB: 1946/05/27  MR#: [id number]  YOV#:[id number]  PCP: Lilian Coma, MD GYN:   SU: Excell Seltzer OTHER MD: Arloa Koh, Altamese Cabal, Scott Minor DDS, Tyler Pita, Franchot Gallo  CHIEF COMPLAINT: stage IV breast cancer TREATMENT: anti-estrogen and targeted therapy  BREAST CANCER HISTORY: From the original intake note 11/04/2013:  Darneisha noted a change in her left breast approximately mid 2014. This was a flattening of the lateral aspect of the left breast. Eventually she noted an indentation and eventually a "sore" in that area. She had not shared this information with her husband, and she did not have a primary care physician. More recently, as she started to have symptoms related to this, she mentioned it to her husband [name] and she establish yourself with Dr. Stephanie Acre, who set her up for bilateral diagnostic mammography and ultrasonography at the [facility] 10/23/2013.. This showed a breast density to be category C. The right breast was negative.  The left breast contained an ulcerated mass laterally measuring at least 4 cm mammography. On physical exam there was an ulcerated mass in the 2:00 location of the left breast, associated with erythematous skin nodules in the upper inner quadrant. The left breast was smaller than the right, and firm. There were palpable left axillary lymph nodes. In addition, on the right upper chest wall, infraclavicular like, there was a firm nodule separate from the bony structures. There was also a right preauricular node and a firm left submandibular node.  Ultrasound showed multiple nodules throughout the lower portion of the left breast. There was a large mass in the upper outer quadrant of the left breast contiguous with the skin ulceration, measuring greater than 5 cm. The left axilla showed at least 3 suspicious masses, the largest  measuring 1.9 cm. Ultrasound of the palpable abnormality of the right chest wall showed a superficial irregular mass, subdermal, measuring 0.8 cm.  Biopsies of the left breast mass, a left axillary lymph node, and the right infra-clavicular mass on 10/23/2013, all showed ([id number]) and invasive ductal carcinoma, grade 1 or 2, estrogen receptor 100% positive, progesterone receptor 23% positive, both with strong staining intensity, with an MIB-1 of 61%, and no HER-2 amplification, the signals ratio being 0.90, and the number per cell 1.80.  On 11/02/2013 the patient underwent bilateral breast MRI and this showed, in the left breast, a 5.8 cm mass eroding through the overlying skin, and also invading the underlying pectoralis muscle and chest wall. There were multiple enhancing nodules throughout the left breast and an adjacent 4.8 cm irregular enhancing mass inferiorly. There are multiple large irregular left axillary lymph nodes, the largest measuring 2.4 cm. There was a 0.9 cm left subpectoral lymph node as well as prevascular and precarinal lymph nodes.  In the right breast far upper inner quadrant there was a 0.8 cm irregular enhancing mass abutting the overlying skin. This is the mass that was biopsied and shown to be positive. In addition there was patchy heterogeneous enhancement of the sternum.  The patient's subsequent history is as detailed below  INTERVAL HISTORY: Linah returns today for followup of her breast cancer accompanied by her husband [name]. Since her last visit here she had staging studies including a PET scan and brain MRI. The PET scan shows involvement of the lungs, liver, and bones. There is however no visceral crisis. 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  Incidentally the patient required at 2 doses of Ativan for sedation before the MRI. In addition the PET scan incidentally showed left hydronephrosis, possibly chronic. The patient is here to discuss these  results and to decide on therapy.  REVIEW OF SYSTEMS: Elaya is doing remarkably well overall. She thinks compared to 6 months ago she has a little bit less energy and she is eating a little bit less well. Her appetite is poor and it doesn't help that her dentures don't fit perfectly. There have been no headaches, visual changes, nausea, vomiting, dizziness, or gait imbalance. She has not noted any change in fine motor activities. There has been no change in the left breast, particularly no pain, losing, ulceration, or swelling. Otherwise a detailed review of systems today was noncontributory   PAST MEDICAL HISTORY: Past Medical History  Diagnosis Date  . Urinary tract infection   . Anemia     PAST SURGICAL HISTORY: Past Surgical History  Procedure Laterality Date  . Cataract extraction  2013  . Mouth surgery  1981    FAMILY HISTORY: The patient's father died at the age of 30, from pneumonia. The patient's mother lived to be 75. The patient had no brothers, one sister. There is no history of breast or ovarian cancer in the family  GYNECOLOGIC HISTORY:  Menarche age 74. The patient is GX P0. She stopped having periods approximately 2003. She did not take hormone replacement. She used birth control pills for approximately 8 years remotely, with no complications   SOCIAL HISTORY:  Bruce retired about a year ago. 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 Her husband [name] is retired from working in Charity fundraiser. They live alone, with no pets.    ADVANCED DIRECTIVES: Not in place   HEALTH MAINTENANCE: History  Substance Use Topics  . Smoking status: Never Smoker   . Smokeless tobacco: Not on file  . Alcohol Use: Yes     Comment: 10 12 glasses/week     Colonoscopy: Never  PAP: 1990  Bone density: Never  Lipid panel:  No Known Allergies  Current Outpatient Prescriptions  Medication Sig Dispense Refill  . aspirin 81 MG EC tablet Take 81 mg by mouth  daily. Swallow whole.      . letrozole (FEMARA) 2.5 MG tablet Take 1 tablet (2.5 mg total) by mouth daily.  90 tablet  4  . LORazepam (ATIVAN) 0.5 MG tablet Take 1 tablet (0.5 mg total) by mouth once. Take 1 tab 1 hour prior to MRI, may repeat x 1.  2 tablet  2  . palbociclib (IBRANCE) 125 MG capsule Take 1 capsule (125 mg total) by mouth daily with breakfast. Take whole with food.  21 capsule  6   No current facility-administered medications for this visit.    OBJECTIVE: Middle-aged white woman who appears stated age 20 Vitals:   11/09/13 1559  BP: 162/82  Pulse: 98  Temp: 98.3 F (36.8 C)  Resp: 18     Body mass index is 19.83 kg/(m^2).    ECOG FS:1 - Symptomatic but completely ambulatory  Exam was not repeated 11/09/2013. Previous showed: Ocular: Sclerae unicteric, pupils equal, round and reactive to light Ear-nose-throat: Oropharynx clear,  followup replacement, partial lower plate  Lymphatic:  there is a 2 cm subcutaneous mass in the right anterior cervical triangle, likely a subcutaneous met  Lungs no rales or rhonchi, good excursion bilaterally Heart regular rate and rhythm, no murmur appreciated Abd soft, nontender, positive  bowel sounds MSK no focal spinal tenderness, no joint edema Neuro: non-focal, well-oriented, appropriate affect Breasts: I do not palpate any masses in the right breast itself. There are no skin or nipple changes of concern. The right axilla is benign. The left breast is considerably smaller than the right. It is not easily movable. There is dimpling and indentation of the lateral silhouette, associated with an erythematous nodule measuring approximately 2-1/2 cm in the upper outer quadrant. There is fixed left axillary adenopathy.   LAB RESULTS:  CMP     Component Value Date/Time   NA 139 11/04/2013 1217   K 3.9 11/04/2013 1217   CO2 19* 11/04/2013 1217   GLUCOSE 115 11/04/2013 1217   BUN 24.1 11/04/2013 1217   CREATININE 1.3* 11/04/2013 1217   CALCIUM  9.9 11/04/2013 1217   PROT 7.6 11/04/2013 1217   ALBUMIN 3.6 11/04/2013 1217   AST 119* 11/04/2013 1217   ALT 59* 11/04/2013 1217   ALKPHOS 234* 11/04/2013 1217   BILITOT 0.64 11/04/2013 1217    I No results found for this basename: SPEP,  UPEP,   kappa and lambda light chains    Lab Results  Component Value Date   WBC 5.9 11/04/2013   NEUTROABS 3.9 11/04/2013   HGB 11.2* 11/04/2013   HCT 34.6* 11/04/2013   MCV 96.9 11/04/2013   PLT 396 11/04/2013      Chemistry      Component Value Date/Time   NA 139 11/04/2013 1217   K 3.9 11/04/2013 1217   CO2 19* 11/04/2013 1217   BUN 24.1 11/04/2013 1217   CREATININE 1.3* 11/04/2013 1217      Component Value Date/Time   CALCIUM 9.9 11/04/2013 1217   ALKPHOS 234* 11/04/2013 1217   AST 119* 11/04/2013 1217   ALT 59* 11/04/2013 1217   BILITOT 0.64 11/04/2013 1217       No results found for this basename: LABCA2    No components found with this basename: LABCA125    No results found for this basename: INR,  in the last 168 hours  Urinalysis No results found for this basename: colorurine,  appearanceur,  labspec,  phurine,  glucoseu,  hgbur,  bilirubinur,  ketonesur,  proteinur,  urobilinogen,  nitrite,  leukocytesur    STUDIES: Mr Kizzie Fantasia Contrast  12/05/13   CLINICAL DATA:  Evaluate for brain metastases. Breast cancer. Chest wall invasion.  EXAM: MRI HEAD WITHOUT AND WITH CONTRAST  TECHNIQUE: Multiplanar, multiecho pulse sequences of the brain and surrounding structures were obtained without and with intravenous contrast.  CONTRAST:  56m MULTIHANCE GADOBENATE DIMEGLUMINE 529 MG/ML IV SOLN  COMPARISON:  PET scan 02015-07-04  FINDINGS: No evidence for acute infarction, hemorrhage, mass lesion, hydrocephalus, or extra-axial fluid. Mild atrophy. Mild subcortical and periventricular T2 and FLAIR hyperintensities, likely chronic microvascular ischemic change. Flow voids are maintained. No foci of chronic hemorrhage.  Sagittal images demonstrate normal pituitary and  cerebellar tonsils. There is widespread osseous metastatic disease to the cervical spine throughout the visualized segments C2-C5. There is also permeative change in the clivus consistent with metastatic disease. Metastatic disease to the calvarial diploic space is also present without intracranial extension.  There is an incidental venous angioma in the right frontal lobe of no clinical consequence. However, a subtle area of enhancement in the left cerebellar white matter 3 mm in diameter, is concerning for a metastasis. No other definite intracranial intra-axial lesions are seen. Negative orbits. Right cataract extraction. No sinus or mastoid disease.  IMPRESSION: Widespread osseous disease of the upper cervical region without visible pathologic fracture or intracranial extension from calvarial or skull base disease.  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  Findings discussed with clinical assistant of ordering provider.   Electronically Signed   By: Rolla Flatten M.D.   On: 11/05/2013 13:55   Mr Breast Bilateral W Wo Contrast  11/02/2013   CLINICAL DATA:  Patient with recent diagnosis left breast carcinoma and metastatic left axillary lymphadenopathy. Additionally there is a palpable nodule within the right chest wall which demonstrated invasive ductal carcinoma.  EXAM: BILATERAL BREAST MRI WITH AND WITHOUT CONTRAST  TECHNIQUE: Multiplanar, multisequence MR images of both breasts were obtained prior to and following the intravenous administration of 104m of MultiHance.  THREE-DIMENSIONAL MR IMAGE RENDERING ON INDEPENDENT WORKSTATION:  Three-dimensional MR images were rendered by post-processing of the original MR data on an independent workstation. The three-dimensional MR images were interpreted, and findings are reported in the following complete MRI report for this study. Three dimensional images were evaluated at the independent DynaCad workstation  COMPARISON:  Previous exams  FINDINGS: Breast  composition: c:  Heterogeneous fibroglandular tissue  Background parenchymal enhancement: Mild  Right breast: Within the far upper inner aspect of the right breast there is a 0.8 x 0.8 cm irregular enhancing mass which appears to abut the overlying skin and underlying muscle. This mass was previously biopsied and compatible with invasive ductal carcinoma.  No additional concerning enhancing masses identified within the right breast.  Left breast: Within the lateral aspect of the left breast there is a 5.8 x 4.8 x 4.6 cm heterogeneously enhancing mass with associated susceptibility artifact from biopsy marking clip compatible with biopsy-proven left breast carcinoma. This mass invades and erodes through the overlying skin. Additionally the posterior margin of this mass invades the underlying pectoralis muscle and chest wall.  There are multiple additional enhancing nodules demonstrated throughout the left breast as well as involving the skin of the left breast. Reference skin nodule measures 3.0 x 0.9 cm (image 68 ; series 6). Along the inferior margin of dominant mass is an adjacent 4.8 x 1.4 x 2.8 cm irregular enhancing mass compatible with additional site of disease.  Along the inferior lateral margin of the breast there is an additional 1.4 x 0.7 cm irregular enhancing mass which appears to involve the underlying pectoralis muscle. There is an additional enhancing mass which appears to involve the pectoralis muscle within the far inferior left lateral aspect of the breast measuring 9 mm (image 141; series 7).  Extensive left breast skin thickening and edema.  Lymph nodes: Multiple enlarged irregular left axillary lymph nodes. A dominant left axillary lymph node measures 2.4 x 1.9 cm. There is 0.9 cm left subpectoral lymph node. Note is made of a 1 cm indeterminate prevascular lymph node. There is a 1 cm precarinal lymph node (image 64; series 12).  Ancillary findings: Ectasia of ascending thoracic aorta measuring  3.8 cm. Patchy heterogeneous enhancement of the sternum.  IMPRESSION: 1. Large dominant irregular enhancing left breast mass which invades and erodes through the overlying skin and involves the underlying pectoralis muscle/chest wall compatible with biopsy-proven carcinoma. 2. Multiple additional enhancing nodules throughout the left breast, involving the skin of the left breast and the left pectoralis muscle compatible with additional sites of disease. 3. Bulky enlarged left axillary lymphadenopathy compatible with biopsy-proven metastatic adenopathy. 4. Irregular enhancing mass within the far superior aspect of the right breast/chest wall involving the overlying skin and  underlying pectoralis muscle compatible with biopsy-proven carcinoma. 5. Heterogeneous patchy enhancement of the sternum, osseous metastasis not excluded. 6. Borderline enlarged mediastinal lymphadenopathy. 7. Ectasia of the ascending thoracic aorta.  RECOMMENDATION: 1. Treatment plan for metastatic left breast carcinoma and metastatic nodule within the superior aspect of the right breast/chest wall. 2. PET/CT for evaluation of distant metastatic disease, confirmation of borderline enlarged mediastinal lymph nodes and for evaluation of possible osseous metastasis.  BI-RADS CATEGORY  6: Known biopsy-proven malignancy.   Electronically Signed   By: Lovey Newcomer M.D.   On: 11/02/2013 16:57   Nm Pet Image Initial (pi) Skull Base To Thigh  11/05/2013   CLINICAL DATA:  Initial treatment strategy for left breast carcinoma with positive lymph nodes.  EXAM: NUCLEAR MEDICINE PET SKULL BASE TO THIGH  TECHNIQUE: 7.3 mCi F-18 FDG was injected intravenously. Full-ring PET imaging was performed from the skull base to thigh after the radiotracer. CT data was obtained and used for attenuation correction and anatomic localization.  FASTING BLOOD GLUCOSE:  Value: 104 mg/dl  COMPARISON:  None.  FINDINGS: NECK  No hypermetabolic lymph nodes in the neck.  CHEST  4.2  cm mass in the posterior left breast abuts the chest wall and has intense metabolic activity with SUV max 18.2. Hypermetabolic left axillary lymph nodes with SUV max cecal 9.8.  There bilateral hypermetabolic hilar and subcarinal lymph nodes.  Within the right lower lobe there is a hypermetabolic nodule measuring 14 mm (image 45, series 6). Within the left lower lobe there is a hypermetabolic nodule measuring 9 mm (image 57, series 2).  ABDOMEN/PELVIS  There are multiple hypermetabolic foci within the liver which corresponding to low-density lesions along the noncontrast CT. Example 11 mm lesion in the right hepatic lobe with SUV max equals 6.2. 14 mm lesion in the subcapsular left hepatic lobe (image 107) with SUV max = 7.1 There approximately 10 liver lesions.  There is hydronephrosis and proximal hydroureter of the right renal collecting system. No obstructing lesion identified. No adenopathy evident within the pelvis to explain the obstruction. There are no hypermetabolic abdominal pelvic lymph nodes.  SKELETON  There is extensive skeletal metastasis with intensely hypermetabolic marrow activity involving the entirety of the axillary and appendicular skeleton. For example, the near entirety of the sacrum is involved with SUV max equal 9.0. Diffuse lytic and sclerotic lesions noted throughout the lumbar and thoracic spine.  IMPRESSION: 1. Stage IV left breast carcinoma with lung, liver, and bone metastasis. 2. Local left axillary nodal metastasis and hypermetabolic mediastinal metastasis. 3. Several hypermetabolic pulmonary nodules consistent with pulmonary metastasis. 4. Multiple hypermetabolic liver metastasis. 5. Extensive hypermetabolic skeletal metastasis. Lesions within the spine may be at risk for pathologic fractures. 6. Right hydronephrosis without obstructing lesion. 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   Electronically Signed   By: Suzy Bouchard M.D.   On: 11/05/2013 11:39   Mm Digital  Diagnostic Bilat  10/23/2013   CLINICAL DATA:  Ulcerating mass in the left breast. This has been present for some time, initially noticed as a mass in the upper-outer quadrant.  EXAM: DIGITAL DIAGNOSTIC  BILATERAL MAMMOGRAM WITH CAD  ULTRASOUND BILATERAL BREAST  COMPARISON:  None.  ACR Breast Density Category c: The breast tissue is heterogeneously dense, which may obscure small masses.  FINDINGS: Right breast is negative.  Left breast is small A contains an ulcerated mass in the lateral aspect. A partially imaged lobulated mass measures at least 4 cm mammographically.  Mammographic images were  processed with CAD.  On physical exam, there is an ulcerating mass in the 2 o'clock location of the left breast approximately 4 cm in diameter. Erythematous skin nodules are identified in the upper inner quadrant of the breast. Left breast is small and diffusely firm. I palpate left axillary lymph nodes.  On the right upper chest wall, in the infraclavicular region there is a firm nodule discrete from the bony structures. I palpate a firm right preauricular node and a firm left submandibular node.  Ultrasound is performed, showing multiple nodules throughout the lower portion of the left breast, heterogeneous in appearance. A large discrete vascular mass is identified in the upper-outer quadrant of the left breast, contiguous with the skin ulceration and measuring greater than 5 cm. Evaluation of the left axilla shows 3 suspicious masses. The largest of these measures 1.9 x 1.5 x 1.3 cm.  Ultrasound of the palpable abnormality on the right chest wall shows a superficial irregular mass, subdermal in location and superficial to the pectoralis muscle. This measures 0.7 x 0.6 x 0.8 cm. Findings are suspicious for metastasis.  IMPRESSION: 1. Findings consistent with advanced left breast cancer. 2. Suspect left axillary metastatic disease. 3. Suspect right chest wall metastasis.  RECOMMENDATION: 1. Recommend ultrasound-guided core  biopsy of mass in the left breast. 2. Recommend ultrasound-guided core biopsy of left axillary lymph node. 3. Recommend ultrasound-guided core biopsy of right chest wall mass for staging. 4. Biopsies are performed on the same day and dictated separately.  I have discussed the findings and recommendations with the patient and her husband. Results were also provided in writing at the conclusion of the visit. If applicable, a reminder letter will be sent to the patient regarding the next appointment.  BI-RADS CATEGORY  5: Highly suggestive of malignancy.   Electronically Signed   By: Shon Hale M.D.   On: 10/23/2013 13:31   Mm Digital Diagnostic Unilat L  10/23/2013   CLINICAL DATA:  Status post ultrasound-guided core biopsy of mass in the 2 o'clock location of the left breast.  EXAM: DIAGNOSTIC LEFT MAMMOGRAM POST ULTRASOUND BIOPSY  COMPARISON:  10/23/2013  FINDINGS: Mammographic images were obtained following ultrasound guided biopsy of mass in the 2 o'clock location of the left breast. A coil shaped clip is identified in the upper outer quadrant as expected.  IMPRESSION: 1. Tissue marker clip is in the expected location following ultrasound-guided core biopsy. 2. Left axillary node was biopsied but not marked with a clip. Right chest wall lesion was biopsied but not marked with a clip.  Final Assessment: Post Procedure Mammograms for Marker Placement   Electronically Signed   By: Shon Hale M.D.   On: 10/23/2013 13:54   Mm Radiologist Eval And Mgmt  10/27/2013   EXAM: ESTABLISHED PATIENT OFFICE VISIT - LEVEL II ([id number])  CHIEF COMPLAINT: The patient returns for results following bilateral ultrasound-guided core biopsies.  Current Pain Level: 3  HISTORY OF PRESENT ILLNESS: The patient has noted a palpable left breast mass for some time. Recently this has become ulcerated. There is no family history of breast cancer or ovarian cancer.  EXAM: There is a firm palpable mass with ulceration located within the left  breast at the 2 o'clock position. There is also palpable left axillary adenopathy and a small palpable mass within the right infraclavicular region. As per Dr. Saul Fordyce note there are small palpable masses in the right preauricular region and left submandibular region.  The biopsy sites within the left breast, left  axilla, and superior right chest wall are examined. There is no hematoma formation or signs of infection. There is mild ecchymosis associated with these sites.  PATHOLOGY: The pathology associated with the left breast mass located at the 2 o'clock position demonstrated grade 1-2 invasive ductal carcinoma. The left axillary lymph node biopsy is consistent with metastatic invasive ductal carcinoma and the right chest wall ultrasound-guided core biopsy is also consistent with a metastatic deposit.  ASSESSMENT AND PLAN: ASSESSMENT AND PLAN I discussed the findings with the patient and her husband and answered their questions. The patient is scheduled for the breast cancer multi disciplinary clinic on 11/04/2013. Breast MRI is currently planned for 11/02/2013. The patient was encouraged to call our breast center for additional questions or concerns. Post biopsy wound care instructions were reviewed with the patient and her husband. Educational materials were shared with the patient.   Electronically Signed   By: Luberta Robertson M.D.   On: 10/27/2013 15:57   US Breast Ltd Uni Left Inc Axilla  10/23/2013   CLINICAL DATA:  Ulcerating mass in the left breast. This has been present for some time, initially noticed as a mass in the upper-outer quadrant.  EXAM: DIGITAL DIAGNOSTIC  BILATERAL MAMMOGRAM WITH CAD  ULTRASOUND BILATERAL BREAST  COMPARISON:  None.  ACR Breast Density Category c: The breast tissue is heterogeneously dense, which may obscure small masses.  FINDINGS: Right breast is negative.  Left breast is small A contains an ulcerated mass in the lateral aspect. A partially imaged lobulated mass measures  at least 4 cm mammographically.  Mammographic images were processed with CAD.  On physical exam, there is an ulcerating mass in the 2 o'clock location of the left breast approximately 4 cm in diameter. Erythematous skin nodules are identified in the upper inner quadrant of the breast. Left breast is small and diffusely firm. I palpate left axillary lymph nodes.  On the right upper chest wall, in the infraclavicular region there is a firm nodule discrete from the bony structures. I palpate a firm right preauricular node and a firm left submandibular node.  Ultrasound is performed, showing multiple nodules throughout the lower portion of the left breast, heterogeneous in appearance. A large discrete vascular mass is identified in the upper-outer quadrant of the left breast, contiguous with the skin ulceration and measuring greater than 5 cm. Evaluation of the left axilla shows 3 suspicious masses. The largest of these measures 1.9 x 1.5 x 1.3 cm.  Ultrasound of the palpable abnormality on the right chest wall shows a superficial irregular mass, subdermal in location and superficial to the pectoralis muscle. This measures 0.7 x 0.6 x 0.8 cm. Findings are suspicious for metastasis.  IMPRESSION: 1. Findings consistent with advanced left breast cancer. 2. Suspect left axillary metastatic disease. 3. Suspect right chest wall metastasis.  RECOMMENDATION: 1. Recommend ultrasound-guided core biopsy of mass in the left breast. 2. Recommend ultrasound-guided core biopsy of left axillary lymph node. 3. Recommend ultrasound-guided core biopsy of right chest wall mass for staging. 4. Biopsies are performed on the same day and dictated separately.  I have discussed the findings and recommendations with the patient and her husband. Results were also provided in writing at the conclusion of the visit. If applicable, a reminder letter will be sent to the patient regarding the next appointment.  BI-RADS CATEGORY  5: Highly suggestive  of malignancy.   Electronically Signed   By: Shon Hale M.D.   On: 10/23/2013 13:31   [place]  10/23/2013   CLINICAL DATA:  Ulcerating mass in the left breast. This has been present for some time, initially noticed as a mass in the upper-outer quadrant.  EXAM: DIGITAL DIAGNOSTIC  BILATERAL MAMMOGRAM WITH CAD  ULTRASOUND BILATERAL BREAST  COMPARISON:  None.  ACR Breast Density Category c: The breast tissue is heterogeneously dense, which may obscure small masses.  FINDINGS: Right breast is negative.  Left breast is small A contains an ulcerated mass in the lateral aspect. A partially imaged lobulated mass measures at least 4 cm mammographically.  Mammographic images were processed with CAD.  On physical exam, there is an ulcerating mass in the 2 o'clock location of the left breast approximately 4 cm in diameter. Erythematous skin nodules are identified in the upper inner quadrant of the breast. Left breast is small and diffusely firm. I palpate left axillary lymph nodes.  On the right upper chest wall, in the infraclavicular region there is a firm nodule discrete from the bony structures. I palpate a firm right preauricular node and a firm left submandibular node.  Ultrasound is performed, showing multiple nodules throughout the lower portion of the left breast, heterogeneous in appearance. A large discrete vascular mass is identified in the upper-outer quadrant of the left breast, contiguous with the skin ulceration and measuring greater than 5 cm. Evaluation of the left axilla shows 3 suspicious masses. The largest of these measures 1.9 x 1.5 x 1.3 cm.  Ultrasound of the palpable abnormality on the right chest wall shows a superficial irregular mass, subdermal in location and superficial to the pectoralis muscle. This measures 0.7 x 0.6 x 0.8 cm. Findings are suspicious for metastasis.  IMPRESSION: 1. Findings consistent with advanced left breast cancer. 2. Suspect left axillary  metastatic disease. 3. Suspect right chest wall metastasis.  RECOMMENDATION: 1. Recommend ultrasound-guided core biopsy of mass in the left breast. 2. Recommend ultrasound-guided core biopsy of left axillary lymph node. 3. Recommend ultrasound-guided core biopsy of right chest wall mass for staging. 4. Biopsies are performed on the same day and dictated separately.  I have discussed the findings and recommendations with the patient and her husband. Results were also provided in writing at the conclusion of the visit. If applicable, a reminder letter will be sent to the patient regarding the next appointment.  BI-RADS CATEGORY  5: Highly suggestive of malignancy.   Electronically Signed   By: Shon Hale M.D.   On: 10/23/2013 13:31   [place] Lt Breast Bx W Loc Dev 1st Lesion Img Bx Spec US Guide  10/23/2013   CLINICAL DATA:  Left breast mass.  EXAM: ULTRASOUND GUIDED LEFT BREAST CORE NEEDLE BIOPSY WITH VACUUM ASSIST  COMPARISON:  10/23/2013  PROCEDURE: I met with the patient and we discussed the procedure of ultrasound-guided biopsy, including benefits and alternatives. We discussed the high likelihood of a successful procedure. We discussed the risks of the procedure including infection, bleeding, tissue injury, clip migration, and inadequate sampling. Informed written consent was given. The usual time-out protocol was performed immediately prior to the procedure.  Using sterile technique and 2% Lidocaine as local anesthetic, under direct ultrasound visualization, a 12 gauge vacuum-assisteddevice was used to perform biopsy of mass in the 2 o'clock location of the left breast using a lateral approach. At the conclusion of the procedure, a coil shaped tissue marker clip was deployed into the biopsy cavity. Follow-up 2-view mammogram was performed and dictated separately.  IMPRESSION: Ultrasound-guided biopsy of left  breast mass. No apparent complications.  Left axillary mass and right chest wall mass are also biopsied  on the same day and dictated separately.   Electronically Signed   By: Shon Hale M.D.   On: 10/23/2013 13:50   [place] Lt Breast Bx W Loc Dev Ea Add Lesion Img Bx Spec US Guide  10/23/2013   CLINICAL DATA:  Left breast mass and enlarged left axillary lymph nodes.  EXAM: ULTRASOUND GUIDED CORE NEEDLE BIOPSY OF A LEFT AXILLARY NODE  COMPARISON:  10/23/2013  FINDINGS: I met with the patient and we discussed the procedure of ultrasound-guided biopsy, including benefits and alternatives. We discussed the high likelihood of a successful procedure. We discussed the risks of the procedure, including infection, bleeding, tissue injury, clip migration, and inadequate sampling. Informed written consent was given. The usual time-out protocol was performed immediately prior to the procedure.  Using sterile technique and 2% Lidocaine as local anesthetic, under direct ultrasound visualization, a 14 gauge spring-loaded device was used to perform biopsy of the lower most left axillary lymph node using a lateral approach. Biopsy site was not marked with a clip.  IMPRESSION: 1. Ultrasound guided biopsy of left axillary lymph node. No apparent complications. 2. Left breast mass and right chest wall mass are also biopsied on the same day, dictated separately.   Electronically Signed   By: Shon Hale M.D.   On: 10/23/2013 13:52   [place] Rt Breast Bx W Loc Dev 1st Lesion Img Bx Spec US Guide  10/23/2013   CLINICAL DATA:  Right chest wall mass.  EXAM: ULTRASOUND GUIDED RIGHT BREAST CORE NEEDLE BIOPSY  COMPARISON:  10/23/2013  FINDINGS: I met with the patient and we discussed the procedure of ultrasound-guided biopsy, including benefits and alternatives. We discussed the high likelihood of a successful procedure. We discussed the risks of the procedure, including infection, bleeding, tissue injury, clip migration, and inadequate sampling. Informed written consent was given. The usual time-out protocol was performed immediately prior to the  procedure.  Using sterile technique and 2% Lidocaine as local anesthetic, under direct ultrasound visualization, a 14 gauge spring-loaded device was used to perform biopsy of mass in the infraclavicular region of the right chest wall using a median approach. The lesion was not marked with a clip at the conclusion of biopsy.  IMPRESSION: 1. Ultrasound guided biopsy of right chest wall mass. No apparent complications. 2. Left breast mass and left axillary mass are also biopsied on the same day, dictated separately.   Electronically Signed   By: Shon Hale M.D.   On: 10/23/2013 13:53   ASSESSMENT: 68 y.o. [place] woman status post left breast in left axillary lymph node biopsy as well as right infraclavicular chest wall biopsy 10/23/2013 for a clinical T3, N2, M1, stage IV invasive ductal carcinoma, grade 2, estrogen receptor 100% positive, progesterone receptor 23% positive, with an MIB-1 of 61%, and no HER-2 amplification.  (1) staging studies 11/05/2013 showed metastatic involvement of bone, liver, lungs, and brain  (2) antiestrogen therapy with letrozole and fulvestrant as well as targeted therapy with palbociclib to be started 11/11/2013  (3) denosumab monthly to be started 11/11/2013  PLAN: I spent a little over an hour today with Baley and her husband [name] going over the results of the scans. There is significant bone involvement. There is however no evidence of spinal cord involvement. There is a 3 mm cerebellar lesion, which is asymptomatic at present. She has bilateral pulmonary nodules and several liver nodules, but  no evidence of visceral crisis either clinically, by laboratory, or radiologically.  Accordingly in this situation NCCN guidelines recommend antiestrogen therapy. We have good data for combining fulvestrant and letrozole in patients who have not previously had tamoxifen. We also have good data for combining palbociclib with both those agents. We went over in detail the possible  toxicities, side effects and complications of these agents, and also that of denosumab, which she will be receiving monthly in preference to zolendronate given the patient's unexplained left hydronephrosis.  [name] will be having lab work weekly for the first month then every 2 weeks for the next 2 months because of the concern with cytopenias when taking Palbociclib. I also encouraged her to let her dentist know that she will be receiving denosumab because of concerns regarding osteonecrosis of the jaw.  I have discussed her situation with Dr. Tammi Klippel, and her case is being discussed at the brain tumor conference. Because estrogen withdrawal should control the brain lesion as well as peripheral lesions, the plan is to follow the brain lesion for now and I have scheduled her for a repeat brain MRI in July, to be shortly followed by a visit with Dr. Tammi Klippel. I have also referred the patient to urology for further evaluation of the left hydronephrosis.  Sandrina has a good understanding of the overall plan. She agrees with it. She knows the goal of treatment in her case is control. She will call with any problems that may develop before her next visit here.    Chauncey Cruel, MD   11/09/2013 5:51 PM

## 2013-11-10 ENCOUNTER — Telehealth: Payer: Self-pay | Admitting: Oncology

## 2013-11-10 ENCOUNTER — Encounter: Payer: Self-pay | Admitting: *Deleted

## 2013-11-10 ENCOUNTER — Encounter: Payer: Self-pay | Admitting: Oncology

## 2013-11-10 NOTE — Progress Notes (Signed)
Faxed ibrance pa form to [employer] °

## 2013-11-10 NOTE — Progress Notes (Signed)
RECEIVED A FAX FROM  OUTPATIENT PHARMACY CONCERNING PRIOR AUTHORIZATION FOR IBRANCE. THIS REQUEST WAS PLACED IN THE MANAGED CARE BIN.

## 2013-11-10 NOTE — Telephone Encounter (Signed)
added appts per 6/10 pof. s/w pt she is aware and will get new schedule tomorrow. pt aware I will get appts for urology - radiation onc - ct tomorrow. pt will stop by to see me after inj.

## 2013-11-11 ENCOUNTER — Other Ambulatory Visit: Payer: Self-pay | Admitting: *Deleted

## 2013-11-11 ENCOUNTER — Ambulatory Visit (HOSPITAL_BASED_OUTPATIENT_CLINIC_OR_DEPARTMENT_OTHER): Payer: Medicare Other

## 2013-11-11 ENCOUNTER — Telehealth: Payer: Self-pay | Admitting: *Deleted

## 2013-11-11 ENCOUNTER — Telehealth: Payer: Self-pay | Admitting: Oncology

## 2013-11-11 ENCOUNTER — Other Ambulatory Visit: Payer: Self-pay | Admitting: Oncology

## 2013-11-11 ENCOUNTER — Other Ambulatory Visit: Payer: Medicare Other

## 2013-11-11 ENCOUNTER — Encounter: Payer: Self-pay | Admitting: Oncology

## 2013-11-11 VITALS — BP 161/81 | HR 86 | Temp 98.2°F

## 2013-11-11 DIAGNOSIS — C50412 Malignant neoplasm of upper-outer quadrant of left female breast: Secondary | ICD-10-CM

## 2013-11-11 DIAGNOSIS — C50419 Malignant neoplasm of upper-outer quadrant of unspecified female breast: Secondary | ICD-10-CM

## 2013-11-11 DIAGNOSIS — C7952 Secondary malignant neoplasm of bone marrow: Secondary | ICD-10-CM

## 2013-11-11 DIAGNOSIS — C7951 Secondary malignant neoplasm of bone: Secondary | ICD-10-CM

## 2013-11-11 DIAGNOSIS — C773 Secondary and unspecified malignant neoplasm of axilla and upper limb lymph nodes: Secondary | ICD-10-CM

## 2013-11-11 DIAGNOSIS — Z5111 Encounter for antineoplastic chemotherapy: Secondary | ICD-10-CM

## 2013-11-11 LAB — CBC WITH DIFFERENTIAL/PLATELET
BASO%: 1.1 % (ref 0.0–2.0)
Basophils Absolute: 0.1 10*3/uL (ref 0.0–0.1)
EOS%: 0.9 % (ref 0.0–7.0)
Eosinophils Absolute: 0.1 10*3/uL (ref 0.0–0.5)
HCT: 32.5 % — ABNORMAL LOW (ref 34.8–46.6)
HEMOGLOBIN: 10.5 g/dL — AB (ref 11.6–15.9)
LYMPH#: 1.5 10*3/uL (ref 0.9–3.3)
LYMPH%: 22.9 % (ref 14.0–49.7)
MCH: 30.9 pg (ref 25.1–34.0)
MCHC: 32.2 g/dL (ref 31.5–36.0)
MCV: 95.9 fL (ref 79.5–101.0)
MONO#: 0.8 10*3/uL (ref 0.1–0.9)
MONO%: 11.7 % (ref 0.0–14.0)
NEUT#: 4.2 10*3/uL (ref 1.5–6.5)
NEUT%: 63.4 % (ref 38.4–76.8)
Platelets: 379 10*3/uL (ref 145–400)
RBC: 3.39 10*6/uL — AB (ref 3.70–5.45)
RDW: 14.3 % (ref 11.2–14.5)
WBC: 6.6 10*3/uL (ref 3.9–10.3)

## 2013-11-11 MED ORDER — FULVESTRANT 250 MG/5ML IM SOLN
500.0000 mg | INTRAMUSCULAR | Status: DC
  Administered 2013-11-11: 500 mg via INTRAMUSCULAR
  Filled 2013-11-11: qty 10

## 2013-11-11 MED ORDER — DENOSUMAB 120 MG/1.7ML ~~LOC~~ SOLN
120.0000 mg | Freq: Once | SUBCUTANEOUS | Status: AC
  Administered 2013-11-11: 120 mg via SUBCUTANEOUS
  Filled 2013-11-11: qty 1.7

## 2013-11-11 NOTE — Patient Instructions (Signed)
Fulvestrant injection What is this medicine? FULVESTRANT (ful VES trant) blocks the effects of estrogen. It is used to treat breast cancer in women past the age of menopause. This medicine may be used for other purposes; ask your health care provider or pharmacist if you have questions. COMMON BRAND NAME(S): FASLODEX What should I tell my health care provider before I take this medicine? They need to know if you have any of these conditions: -bleeding problems -liver disease -low levels of platelets in the blood -an unusual or allergic reaction to fulvestrant, other medicines, foods, dyes, or preservatives -pregnant or trying to get pregnant -breast-feeding How should I use this medicine? This medicine is for injection into a muscle. It is usually given by a health care professional in a hospital or clinic setting. Talk to your pediatrician regarding the use of this medicine in children. Special care may be needed. Overdosage: If you think you have taken too much of this medicine contact a poison control center or emergency room at once. NOTE: This medicine is only for you. Do not share this medicine with others. What if I miss a dose? It is important not to miss your dose. Call your doctor or health care professional if you are unable to keep an appointment. What may interact with this medicine? -medicines that treat or prevent blood clots like warfarin, enoxaparin, and dalteparin This list may not describe all possible interactions. Give your health care provider a list of all the medicines, herbs, non-prescription drugs, or dietary supplements you use. Also tell them if you smoke, drink alcohol, or use illegal drugs. Some items may interact with your medicine. What should I watch for while using this medicine? Your condition will be monitored carefully while you are receiving this medicine. You will need important blood work done while you are taking this medicine. Do not become pregnant  while taking this medicine. Women should inform their doctor if they wish to become pregnant or think they might be pregnant. There is a potential for serious side effects to an unborn child. Talk to your health care professional or pharmacist for more information. What side effects may I notice from receiving this medicine? Side effects that you should report to your doctor or health care professional as soon as possible: -allergic reactions like skin rash, itching or hives, swelling of the face, lips, or tongue -feeling faint or lightheaded, falls -fever or flu-like symptoms -sore throat -vaginal bleeding Side effects that usually do not require medical attention (report to your doctor or health care professional if they continue or are bothersome): -aches, pains -constipation or diarrhea -headache -hot flashes -nausea, vomiting -pain at site where injected -stomach pain This list may not describe all possible side effects. Call your doctor for medical advice about side effects. You may report side effects to FDA at 1-800-FDA-1088. Where should I keep my medicine? This drug is given in a hospital or clinic and will not be stored at home. NOTE: This sheet is a summary. It may not cover all possible information. If you have questions about this medicine, talk to your doctor, pharmacist, or health care provider.  2014, Elsevier/Gold Standard. (2007-09-29 15:39:24) Denosumab injection What is this medicine? DENOSUMAB (den oh sue mab) slows bone breakdown. Prolia is used to treat osteoporosis in women after menopause and in men. Delton See is used to prevent bone fractures and other bone problems caused by cancer bone metastases. Delton See is also used to treat giant cell tumor of the bone. This  This medicine may be used for other purposes; ask your health care provider or pharmacist if you have questions. COMMON BRAND NAME(S): Prolia, XGEVA What should I tell my health care provider before I take this  medicine? They need to know if you have any of these conditions: -dental disease -eczema -infection or history of infections -kidney disease or on dialysis -low blood calcium or vitamin D -malabsorption syndrome -scheduled to have surgery or tooth extraction -taking medicine that contains denosumab -thyroid or parathyroid disease -an unusual reaction to denosumab, other medicines, foods, dyes, or preservatives -pregnant or trying to get pregnant -breast-feeding How should I use this medicine? This medicine is for injection under the skin. It is given by a health care professional in a hospital or clinic setting. If you are getting Prolia, a special MedGuide will be given to you by the pharmacist with each prescription and refill. Be sure to read this information carefully each time. For Prolia, talk to your pediatrician regarding the use of this medicine in children. Special care may be needed. For Xgeva, talk to your pediatrician regarding the use of this medicine in children. While this drug may be prescribed for children as young as 13 years for selected conditions, precautions do apply. Overdosage: If you think you've taken too much of this medicine contact a poison control center or emergency room at once. Overdosage: If you think you have taken too much of this medicine contact a poison control center or emergency room at once. NOTE: This medicine is only for you. Do not share this medicine with others. What if I miss a dose? It is important not to miss your dose. Call your doctor or health care professional if you are unable to keep an appointment. What may interact with this medicine? Do not take this medicine with any of the following medications: -other medicines containing denosumab This medicine may also interact with the following medications: -medicines that suppress the immune system -medicines that treat cancer -steroid medicines like prednisone or cortisone This list  may not describe all possible interactions. Give your health care provider a list of all the medicines, herbs, non-prescription drugs, or dietary supplements you use. Also tell them if you smoke, drink alcohol, or use illegal drugs. Some items may interact with your medicine. What should I watch for while using this medicine? Visit your doctor or health care professional for regular checks on your progress. Your doctor or health care professional may order blood tests and other tests to see how you are doing. Call your doctor or health care professional if you get a cold or other infection while receiving this medicine. Do not treat yourself. This medicine may decrease your body's ability to fight infection. You should make sure you get enough calcium and vitamin D while you are taking this medicine, unless your doctor tells you not to. Discuss the foods you eat and the vitamins you take with your health care professional. See your dentist regularly. Brush and floss your teeth as directed. Before you have any dental work done, tell your dentist you are receiving this medicine. Do not become pregnant while taking this medicine or for 5 months after stopping it. Women should inform their doctor if they wish to become pregnant or think they might be pregnant. There is a potential for serious side effects to an unborn child. Talk to your health care professional or pharmacist for more information. What side effects may I notice from receiving this medicine? Side effects that   should report to your doctor or health care professional as soon as possible: -allergic reactions like skin rash, itching or hives, swelling of the face, lips, or tongue -breathing problems -chest pain -fast, irregular heartbeat -feeling faint or lightheaded, falls -fever, chills, or any other sign of infection -muscle spasms, tightening, or twitches -numbness or tingling -skin blisters or bumps, or is dry, peels, or red -slow  healing or unexplained pain in the mouth or jaw -unusual bleeding or bruising Side effects that usually do not require medical attention (Report these to your doctor or health care professional if they continue or are bothersome.): -muscle pain -stomach upset, gas This list may not describe all possible side effects. Call your doctor for medical advice about side effects. You may report side effects to FDA at 1-800-FDA-1088. Where should I keep my medicine? This medicine is only given in a clinic, doctor's office, or other health care setting and will not be stored at home. NOTE: This sheet is a summary. It may not cover all possible information. If you have questions about this medicine, talk to your doctor, pharmacist, or health care provider.  2014, Elsevier/Gold Standard. (2011-11-19 12:37:47)

## 2013-11-11 NOTE — Telephone Encounter (Signed)
left schedule for pt to pick up today which included mri/radonc/urology. lmonvm for pt to confirm appts and make sure she got schedule. pt was aware of other appts. pt to see dr Katharine Look 12/23/13 @ 2:15pm.

## 2013-11-11 NOTE — Progress Notes (Signed)
Keylen here for lab and injection appointments.  To be started on X-geva and Faslodex today.  Consent form sign for X-geva.

## 2013-11-11 NOTE — Telephone Encounter (Signed)
Spoke to pt concerning [place] from 11/04/13. Pt denies questions or concerns regarding dx or treatment care plan. Pt relate she did well with her first injection of Faslodex. She is still waiting on Ibrance to be filled, it is currently in processing.  Pt has started on Letrozole.  Discussed s/e from medication and how to manage. Encourage pt to call with needs. Received verbal understanding. Contact information given.

## 2013-11-11 NOTE — Progress Notes (Signed)
[place], [id number], approved ibrance from 11/09/13-11/10/15

## 2013-11-12 ENCOUNTER — Other Ambulatory Visit: Payer: Self-pay | Admitting: *Deleted

## 2013-11-13 ENCOUNTER — Ambulatory Visit: Payer: Medicare Other | Admitting: Oncology

## 2013-11-17 ENCOUNTER — Other Ambulatory Visit: Payer: Self-pay | Admitting: *Deleted

## 2013-11-17 DIAGNOSIS — C50919 Malignant neoplasm of unspecified site of unspecified female breast: Secondary | ICD-10-CM

## 2013-11-17 DIAGNOSIS — C7931 Secondary malignant neoplasm of brain: Principal | ICD-10-CM

## 2013-11-18 ENCOUNTER — Other Ambulatory Visit (HOSPITAL_BASED_OUTPATIENT_CLINIC_OR_DEPARTMENT_OTHER): Payer: Medicare Other

## 2013-11-18 DIAGNOSIS — C50419 Malignant neoplasm of upper-outer quadrant of unspecified female breast: Secondary | ICD-10-CM

## 2013-11-18 DIAGNOSIS — C7931 Secondary malignant neoplasm of brain: Principal | ICD-10-CM

## 2013-11-18 DIAGNOSIS — C50919 Malignant neoplasm of unspecified site of unspecified female breast: Secondary | ICD-10-CM

## 2013-11-18 LAB — CBC WITH DIFFERENTIAL/PLATELET
BASO%: 1.3 % (ref 0.0–2.0)
BASOS ABS: 0.1 10*3/uL (ref 0.0–0.1)
EOS%: 1.4 % (ref 0.0–7.0)
Eosinophils Absolute: 0.1 10*3/uL (ref 0.0–0.5)
HCT: 31.7 % — ABNORMAL LOW (ref 34.8–46.6)
HEMOGLOBIN: 10.2 g/dL — AB (ref 11.6–15.9)
LYMPH#: 1.9 10*3/uL (ref 0.9–3.3)
LYMPH%: 26.7 % (ref 14.0–49.7)
MCH: 30.8 pg (ref 25.1–34.0)
MCHC: 32.1 g/dL (ref 31.5–36.0)
MCV: 96 fL (ref 79.5–101.0)
MONO#: 1 10*3/uL — ABNORMAL HIGH (ref 0.1–0.9)
MONO%: 14.3 % — AB (ref 0.0–14.0)
NEUT#: 4 10*3/uL (ref 1.5–6.5)
NEUT%: 56.3 % (ref 38.4–76.8)
Platelets: 346 10*3/uL (ref 145–400)
RBC: 3.3 10*6/uL — ABNORMAL LOW (ref 3.70–5.45)
RDW: 14.5 % (ref 11.2–14.5)
WBC: 7.1 10*3/uL (ref 3.9–10.3)

## 2013-11-18 LAB — COMPREHENSIVE METABOLIC PANEL (CC13)
ALK PHOS: 235 U/L — AB (ref 40–150)
ALT: 58 U/L — AB (ref 0–55)
AST: 111 U/L — AB (ref 5–34)
Albumin: 3.3 g/dL — ABNORMAL LOW (ref 3.5–5.0)
Anion Gap: 10 mEq/L (ref 3–11)
BUN: 21.9 mg/dL (ref 7.0–26.0)
CO2: 25 mEq/L (ref 22–29)
Calcium: 8.9 mg/dL (ref 8.4–10.4)
Chloride: 106 mEq/L (ref 98–109)
Creatinine: 1.2 mg/dL — ABNORMAL HIGH (ref 0.6–1.1)
Glucose: 94 mg/dl (ref 70–140)
POTASSIUM: 4.7 meq/L (ref 3.5–5.1)
Sodium: 140 mEq/L (ref 136–145)
Total Bilirubin: 0.42 mg/dL (ref 0.20–1.20)
Total Protein: 7.3 g/dL (ref 6.4–8.3)

## 2013-11-24 ENCOUNTER — Other Ambulatory Visit: Payer: Self-pay | Admitting: Physician Assistant

## 2013-11-24 ENCOUNTER — Other Ambulatory Visit: Payer: Self-pay | Admitting: *Deleted

## 2013-11-24 DIAGNOSIS — C50412 Malignant neoplasm of upper-outer quadrant of left female breast: Secondary | ICD-10-CM

## 2013-11-24 DIAGNOSIS — C50919 Malignant neoplasm of unspecified site of unspecified female breast: Secondary | ICD-10-CM

## 2013-11-24 DIAGNOSIS — C7951 Secondary malignant neoplasm of bone: Secondary | ICD-10-CM

## 2013-11-24 DIAGNOSIS — C7931 Secondary malignant neoplasm of brain: Secondary | ICD-10-CM

## 2013-11-25 ENCOUNTER — Ambulatory Visit (HOSPITAL_BASED_OUTPATIENT_CLINIC_OR_DEPARTMENT_OTHER): Payer: Medicare Other

## 2013-11-25 ENCOUNTER — Other Ambulatory Visit (HOSPITAL_BASED_OUTPATIENT_CLINIC_OR_DEPARTMENT_OTHER): Payer: Medicare Other

## 2013-11-25 VITALS — BP 154/77 | HR 89 | Temp 98.1°F

## 2013-11-25 DIAGNOSIS — C773 Secondary and unspecified malignant neoplasm of axilla and upper limb lymph nodes: Secondary | ICD-10-CM

## 2013-11-25 DIAGNOSIS — C787 Secondary malignant neoplasm of liver and intrahepatic bile duct: Secondary | ICD-10-CM

## 2013-11-25 DIAGNOSIS — C7951 Secondary malignant neoplasm of bone: Secondary | ICD-10-CM

## 2013-11-25 DIAGNOSIS — C50412 Malignant neoplasm of upper-outer quadrant of left female breast: Secondary | ICD-10-CM

## 2013-11-25 DIAGNOSIS — C7952 Secondary malignant neoplasm of bone marrow: Secondary | ICD-10-CM

## 2013-11-25 DIAGNOSIS — C50419 Malignant neoplasm of upper-outer quadrant of unspecified female breast: Secondary | ICD-10-CM

## 2013-11-25 DIAGNOSIS — C7800 Secondary malignant neoplasm of unspecified lung: Secondary | ICD-10-CM

## 2013-11-25 DIAGNOSIS — Z5111 Encounter for antineoplastic chemotherapy: Secondary | ICD-10-CM

## 2013-11-25 LAB — COMPREHENSIVE METABOLIC PANEL (CC13)
ALBUMIN: 3.3 g/dL — AB (ref 3.5–5.0)
ALT: 42 U/L (ref 0–55)
AST: 86 U/L — ABNORMAL HIGH (ref 5–34)
Alkaline Phosphatase: 209 U/L — ABNORMAL HIGH (ref 40–150)
Anion Gap: 8 mEq/L (ref 3–11)
BILIRUBIN TOTAL: 0.57 mg/dL (ref 0.20–1.20)
BUN: 22 mg/dL (ref 7.0–26.0)
CO2: 25 mEq/L (ref 22–29)
Calcium: 8.8 mg/dL (ref 8.4–10.4)
Chloride: 105 mEq/L (ref 98–109)
Creatinine: 1.3 mg/dL — ABNORMAL HIGH (ref 0.6–1.1)
GLUCOSE: 124 mg/dL (ref 70–140)
POTASSIUM: 4.3 meq/L (ref 3.5–5.1)
Sodium: 139 mEq/L (ref 136–145)
Total Protein: 7.4 g/dL (ref 6.4–8.3)

## 2013-11-25 LAB — CBC WITH DIFFERENTIAL/PLATELET
BASO%: 1.8 % (ref 0.0–2.0)
BASOS ABS: 0.1 10*3/uL (ref 0.0–0.1)
EOS ABS: 0 10*3/uL (ref 0.0–0.5)
EOS%: 1.4 % (ref 0.0–7.0)
HCT: 31.2 % — ABNORMAL LOW (ref 34.8–46.6)
HEMOGLOBIN: 10.1 g/dL — AB (ref 11.6–15.9)
LYMPH%: 32.9 % (ref 14.0–49.7)
MCH: 30.8 pg (ref 25.1–34.0)
MCHC: 32.2 g/dL (ref 31.5–36.0)
MCV: 95.7 fL (ref 79.5–101.0)
MONO#: 0.1 10*3/uL (ref 0.1–0.9)
MONO%: 4.2 % (ref 0.0–14.0)
NEUT#: 2.1 10*3/uL (ref 1.5–6.5)
NEUT%: 59.7 % (ref 38.4–76.8)
PLATELETS: 358 10*3/uL (ref 145–400)
RBC: 3.26 10*6/uL — ABNORMAL LOW (ref 3.70–5.45)
RDW: 15.2 % — ABNORMAL HIGH (ref 11.2–14.5)
WBC: 3.6 10*3/uL — ABNORMAL LOW (ref 3.9–10.3)
lymph#: 1.2 10*3/uL (ref 0.9–3.3)

## 2013-11-25 MED ORDER — FULVESTRANT 250 MG/5ML IM SOLN
500.0000 mg | INTRAMUSCULAR | Status: DC
  Administered 2013-11-25: 500 mg via INTRAMUSCULAR
  Filled 2013-11-25: qty 10

## 2013-12-02 ENCOUNTER — Other Ambulatory Visit (HOSPITAL_BASED_OUTPATIENT_CLINIC_OR_DEPARTMENT_OTHER): Payer: Medicare Other

## 2013-12-02 DIAGNOSIS — C7931 Secondary malignant neoplasm of brain: Secondary | ICD-10-CM

## 2013-12-02 DIAGNOSIS — C7951 Secondary malignant neoplasm of bone: Secondary | ICD-10-CM

## 2013-12-02 DIAGNOSIS — C50919 Malignant neoplasm of unspecified site of unspecified female breast: Secondary | ICD-10-CM

## 2013-12-02 DIAGNOSIS — C50419 Malignant neoplasm of upper-outer quadrant of unspecified female breast: Secondary | ICD-10-CM

## 2013-12-02 DIAGNOSIS — C50412 Malignant neoplasm of upper-outer quadrant of left female breast: Secondary | ICD-10-CM

## 2013-12-02 LAB — COMPREHENSIVE METABOLIC PANEL (CC13)
ALT: 33 U/L (ref 0–55)
ANION GAP: 11 meq/L (ref 3–11)
AST: 72 U/L — ABNORMAL HIGH (ref 5–34)
Albumin: 3.4 g/dL — ABNORMAL LOW (ref 3.5–5.0)
Alkaline Phosphatase: 192 U/L — ABNORMAL HIGH (ref 40–150)
BILIRUBIN TOTAL: 0.54 mg/dL (ref 0.20–1.20)
BUN: 25.8 mg/dL (ref 7.0–26.0)
CO2: 25 meq/L (ref 22–29)
Calcium: 9 mg/dL (ref 8.4–10.4)
Chloride: 104 mEq/L (ref 98–109)
Creatinine: 1.4 mg/dL — ABNORMAL HIGH (ref 0.6–1.1)
GLUCOSE: 106 mg/dL (ref 70–140)
Potassium: 4.3 mEq/L (ref 3.5–5.1)
SODIUM: 140 meq/L (ref 136–145)
TOTAL PROTEIN: 7.2 g/dL (ref 6.4–8.3)

## 2013-12-02 LAB — CBC WITH DIFFERENTIAL/PLATELET
BASO%: 1.7 % (ref 0.0–2.0)
BASOS ABS: 0 10*3/uL (ref 0.0–0.1)
EOS%: 1.1 % (ref 0.0–7.0)
Eosinophils Absolute: 0 10*3/uL (ref 0.0–0.5)
HEMATOCRIT: 29.7 % — AB (ref 34.8–46.6)
HGB: 9.5 g/dL — ABNORMAL LOW (ref 11.6–15.9)
LYMPH%: 56.9 % — ABNORMAL HIGH (ref 14.0–49.7)
MCH: 30.7 pg (ref 25.1–34.0)
MCHC: 32 g/dL (ref 31.5–36.0)
MCV: 96.1 fL (ref 79.5–101.0)
MONO#: 0.1 10*3/uL (ref 0.1–0.9)
MONO%: 4.6 % (ref 0.0–14.0)
NEUT#: 0.6 10*3/uL — ABNORMAL LOW (ref 1.5–6.5)
NEUT%: 35.7 % — ABNORMAL LOW (ref 38.4–76.8)
Platelets: 194 10*3/uL (ref 145–400)
RBC: 3.09 10*6/uL — ABNORMAL LOW (ref 3.70–5.45)
RDW: 15.8 % — ABNORMAL HIGH (ref 11.2–14.5)
WBC: 1.7 10*3/uL — AB (ref 3.9–10.3)
lymph#: 1 10*3/uL (ref 0.9–3.3)

## 2013-12-03 ENCOUNTER — Telehealth: Payer: Self-pay | Admitting: *Deleted

## 2013-12-03 NOTE — Telephone Encounter (Signed)
This RN was given lab results obtained yesterday by [facility] with request to follow up with pt.  Noted pt started Ibrance in June 2015.  This RN spoke with pt and obtained actual start date of 11/18/2013.  Reviewed lab with pt including need to HOLD the Ibrance presently. Pt is to continue all other prescribed medications.  Labs will be rechecked in 1 week and pt informed of restart and if dose change required.  Neutropenic precautions discussed with pt verbalizing understanding of when and how to contact this office.  Lab will be added to current appointment on 12/09/2013.  All questions answered and no other needs at this time.

## 2013-12-03 NOTE — Telephone Encounter (Signed)
Thank you :)

## 2013-12-06 ENCOUNTER — Other Ambulatory Visit: Payer: Self-pay | Admitting: Oncology

## 2013-12-06 MED ORDER — PALBOCICLIB 100 MG PO CAPS: 100.0000 mg | ORAL_CAPSULE | Freq: Every day | ORAL | Status: DC

## 2013-12-06 NOTE — Progress Notes (Unsigned)
Called Vicki Marshall re labs and Ibrance dose; she started the 125 mg 6/17 and stopped 7/2 (last dose); she is otherwise doing well. Cost is not an issue. I put in a script for the 100 mg dose and she wil have lab work 7/8. Shje should rdeesume at the lower dose 7/9 depending on lab results

## 2013-12-07 ENCOUNTER — Other Ambulatory Visit: Payer: Self-pay | Admitting: *Deleted

## 2013-12-07 ENCOUNTER — Telehealth: Payer: Self-pay | Admitting: Oncology

## 2013-12-07 ENCOUNTER — Encounter: Payer: Self-pay | Admitting: Oncology

## 2013-12-07 DIAGNOSIS — C50412 Malignant neoplasm of upper-outer quadrant of left female breast: Secondary | ICD-10-CM

## 2013-12-07 NOTE — Progress Notes (Signed)
Per Rollene Fare at [facility], patient approved for Ibrance  12/02/13-12/02/14  7500.00. I will let billing and send to medical records.

## 2013-12-07 NOTE — Telephone Encounter (Signed)
PER 7/5 POF LABS WEEKLY 7/8 TIMES 6.  ADDED 2 LAB APPTS FOR 7/22 AND 8/5 AS PT IS ALREADY ON SCHEDULE FOR LAB APPTS 7/8 THRU 8/12. ADDED COMMENT TO 7/8 APPT TO SEND PT FOR NEW SCHEDULE.

## 2013-12-08 ENCOUNTER — Other Ambulatory Visit: Payer: Self-pay | Admitting: *Deleted

## 2013-12-08 ENCOUNTER — Telehealth: Payer: Self-pay | Admitting: Oncology

## 2013-12-08 NOTE — Telephone Encounter (Signed)
, °

## 2013-12-09 ENCOUNTER — Encounter: Payer: Self-pay | Admitting: Radiation Oncology

## 2013-12-09 ENCOUNTER — Ambulatory Visit (HOSPITAL_BASED_OUTPATIENT_CLINIC_OR_DEPARTMENT_OTHER): Payer: Medicare Other

## 2013-12-09 ENCOUNTER — Other Ambulatory Visit (HOSPITAL_BASED_OUTPATIENT_CLINIC_OR_DEPARTMENT_OTHER): Payer: Medicare Other

## 2013-12-09 VITALS — BP 147/78 | HR 96 | Temp 99.1°F

## 2013-12-09 DIAGNOSIS — C7951 Secondary malignant neoplasm of bone: Secondary | ICD-10-CM

## 2013-12-09 DIAGNOSIS — C50412 Malignant neoplasm of upper-outer quadrant of left female breast: Secondary | ICD-10-CM

## 2013-12-09 DIAGNOSIS — C7952 Secondary malignant neoplasm of bone marrow: Secondary | ICD-10-CM

## 2013-12-09 DIAGNOSIS — C50919 Malignant neoplasm of unspecified site of unspecified female breast: Secondary | ICD-10-CM

## 2013-12-09 DIAGNOSIS — C7931 Secondary malignant neoplasm of brain: Secondary | ICD-10-CM

## 2013-12-09 DIAGNOSIS — C50419 Malignant neoplasm of upper-outer quadrant of unspecified female breast: Secondary | ICD-10-CM

## 2013-12-09 DIAGNOSIS — Z5111 Encounter for antineoplastic chemotherapy: Secondary | ICD-10-CM

## 2013-12-09 LAB — CBC WITH DIFFERENTIAL/PLATELET
BASO%: 1.6 % (ref 0.0–2.0)
Basophils Absolute: 0 10*3/uL (ref 0.0–0.1)
EOS%: 0.4 % (ref 0.0–7.0)
Eosinophils Absolute: 0 10*3/uL (ref 0.0–0.5)
HEMATOCRIT: 30 % — AB (ref 34.8–46.6)
HGB: 9.6 g/dL — ABNORMAL LOW (ref 11.6–15.9)
LYMPH#: 1.1 10*3/uL (ref 0.9–3.3)
LYMPH%: 49.4 % (ref 14.0–49.7)
MCH: 31.3 pg (ref 25.1–34.0)
MCHC: 32.1 g/dL (ref 31.5–36.0)
MCV: 97.5 fL (ref 79.5–101.0)
MONO#: 0.3 10*3/uL (ref 0.1–0.9)
MONO%: 11.6 % (ref 0.0–14.0)
NEUT#: 0.8 10*3/uL — ABNORMAL LOW (ref 1.5–6.5)
NEUT%: 37 % — AB (ref 38.4–76.8)
Platelets: 139 10*3/uL — ABNORMAL LOW (ref 145–400)
RBC: 3.08 10*6/uL — ABNORMAL LOW (ref 3.70–5.45)
RDW: 15.9 % — ABNORMAL HIGH (ref 11.2–14.5)
WBC: 2.2 10*3/uL — AB (ref 3.9–10.3)

## 2013-12-09 LAB — COMPREHENSIVE METABOLIC PANEL (CC13)
ALT: 28 U/L (ref 0–55)
ANION GAP: 10 meq/L (ref 3–11)
AST: 57 U/L — ABNORMAL HIGH (ref 5–34)
Albumin: 3.4 g/dL — ABNORMAL LOW (ref 3.5–5.0)
Alkaline Phosphatase: 185 U/L — ABNORMAL HIGH (ref 40–150)
BUN: 22.2 mg/dL (ref 7.0–26.0)
CHLORIDE: 105 meq/L (ref 98–109)
CO2: 24 meq/L (ref 22–29)
CREATININE: 1.2 mg/dL — AB (ref 0.6–1.1)
Calcium: 8.8 mg/dL (ref 8.4–10.4)
Glucose: 133 mg/dl (ref 70–140)
Potassium: 4.2 mEq/L (ref 3.5–5.1)
SODIUM: 139 meq/L (ref 136–145)
TOTAL PROTEIN: 7.4 g/dL (ref 6.4–8.3)
Total Bilirubin: 0.42 mg/dL (ref 0.20–1.20)

## 2013-12-09 MED ORDER — DENOSUMAB 120 MG/1.7ML ~~LOC~~ SOLN
120.0000 mg | Freq: Once | SUBCUTANEOUS | Status: AC
  Administered 2013-12-09: 120 mg via SUBCUTANEOUS
  Filled 2013-12-09: qty 1.7

## 2013-12-09 MED ORDER — FULVESTRANT 250 MG/5ML IM SOLN
500.0000 mg | Freq: Once | INTRAMUSCULAR | Status: AC
  Administered 2013-12-09: 500 mg via INTRAMUSCULAR
  Filled 2013-12-09: qty 10

## 2013-12-09 NOTE — Progress Notes (Signed)
Location/Histology of Brain Tumor: 3 mm left cerebellar metastasis  Patient presented with symptoms of:  Asymptomatic; found while staging left breast ca  Past or anticipated interventions, if any, per neurosurgery: None  Past or anticipated interventions, if any, per medical oncology:   Accordingly in this situation NCCN guidelines recommend antiestrogen therapy. We have good data for combining fulvestrant and letrozole in patients who have not previously had tamoxifen. We also have good data for combining palbociclib with both those agents. We went over in detail the possible toxicities, side effects and complications of these agents, and also that of denosumab, which she will be receiving monthly in preference to zolendronate given the patient's unexplained left hydronephrosis.  [name] will be having lab work weekly for the first month then every 2 weeks for the next 2 months because of the concern with cytopenias when taking Palbociclib. I also encouraged her to let her dentist know that she will be receiving denosumab because of concerns regarding osteonecrosis of the jaw.  I have discussed her situation with Dr. Tammi Klippel, and her case is being discussed at the brain tumor conference. Because estrogen withdrawal should control the brain lesion as well as peripheral lesions, the plan is to follow the brain lesion for now and I have scheduled her for a repeat brain MRI in July, to be shortly followed by a visit with Dr. Tammi Klippel. I have also referred the patient to urology for further evaluation of the left hydronephrosis.   Dose of Decadron, if applicable: not prescribed  Recent neurologic symptoms, if any:   Seizures: no  Headaches: no  Nausea: no  Dizziness/ataxia: no  Difficulty with hand coordination: no  Focal numbness/weakness: no  Visual deficits/changes: no  Confusion/Memory deficits: no  Painful bone metastases at present, if any: yes  SAFETY ISSUES:  Prior  radiation? no  Pacemaker/ICD? no  Possible current pregnancy? no  Is the patient on methotrexate? no  Additional Complaints / other details: 67 year old female. Married to [place]. 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   C/O poor appetite and decreased energy level. Reports her dentures don't fit well. Denies headache, visual changes, nausea, vomiting, dizziness or imbalance.  Noted change in left breast in 2014 not addressed until 10/23/2013. ER, PR positive and HER 2 negative.   Repeat MRI scheduled for 12/15/2013.  Referred to urologist for left hydronephrosis

## 2013-12-10 ENCOUNTER — Telehealth: Payer: Self-pay | Admitting: *Deleted

## 2013-12-15 ENCOUNTER — Encounter: Payer: Self-pay | Admitting: Radiation Oncology

## 2013-12-15 ENCOUNTER — Ambulatory Visit
Admission: RE | Admit: 2013-12-15 | Discharge: 2013-12-15 | Disposition: A | Discharge status: Home / Self Care | Payer: Medicare Other | Source: Ambulatory Visit | Attending: Oncology | Admitting: Oncology

## 2013-12-15 DIAGNOSIS — C7931 Secondary malignant neoplasm of brain: Secondary | ICD-10-CM

## 2013-12-15 MED ORDER — GADOBENATE DIMEGLUMINE 529 MG/ML IV SOLN
11.0000 mL | Freq: Once | INTRAVENOUS | Status: AC | PRN
  Administered 2013-12-15: 11 mL via INTRAVENOUS

## 2013-12-15 NOTE — Progress Notes (Signed)
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 female who noted a change in her left breast approximately mid 2014. This was a flattening of the lateral aspect of the left breast. Eventually she noted an indentation and eventually a "sore" in that area. She had not shared this information with her husband, and she did not have a primary care physician. More recently, as she started to have symptoms related to this, she mentioned it to her husband [name] and she establish yourself with Dr. Stephanie Acre, who set her up for bilateral diagnostic mammography and ultrasonography at the [facility] 10/23/2013.. This showed a breast density to be category C. The right breast was negative.  The left breast contained an ulcerated mass laterally measuring at least 4 cm mammography. On physical exam there was an ulcerated mass in the 2:00 location of the left breast, associated with erythematous skin nodules in the upper inner quadrant. The left breast was smaller than the right, and firm. There were palpable left axillary lymph nodes. In addition, on the right upper chest wall, infraclavicular like, there was a firm nodule separate from the bony structures. There was also a right preauricular node and a firm left submandibular node.  Ultrasound showed multiple nodules throughout the lower portion of the left breast. There was a large mass in the upper outer quadrant of the left breast contiguous with the skin ulceration, measuring greater than 5 cm.  The left axilla showed at least 3 suspicious masses, the largest measuring 1.9 cm. Ultrasound of the palpable abnormality of the right chest wall showed a superficial irregular mass, subdermal, measuring 0.8 cm.  Biopsies of the left breast mass, a left axillary lymph node, and the right infra-clavicular mass on 10/23/2013, all showed ([id number]) and invasive ductal carcinoma, grade 1 or 2, estrogen receptor 100% positive, progesterone receptor 23% positive, both with strong staining intensity, with an MIB-1 of 61%, and no HER-2 amplification, the signals ratio being 0.90, and the number per cell 1.80.  On 11/02/2013 the patient underwent bilateral breast MRI and this showed, in the left breast, a 5.8 cm mass eroding through the overlying skin, and also invading the underlying pectoralis muscle and chest wall. There were multiple enhancing nodules throughout the left breast and an adjacent 4.8 cm irregular enhancing mass inferiorly. There are multiple large irregular left axillary lymph nodes, the largest measuring 2.4 cm. There was a 0.9 cm left subpectoral lymph node as well as prevascular and precarinal lymph nodes. she had staging studies including a PET scan and brain MRI. The PET scan shows involvement of the lungs, liver, and bones. There is however no visceral crisis. 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 Incidentally the patient required at 2 doses of Ativan for sedation before the MRI. In addition the PET scan incidentally showed left hydronephrosis, possibly chronic. The patient reviewed these findings with Dr. Jana Hakim on 11/09/13.  She was started on anti-estrogen therapy combining fulvestrant and letrozole and underwent short-interval follow-up MRI on  12/15/13.  She returns today to review the MRI findings and discuss options for treating the solitary brain metastasis.  PREVIOUS RADIATION THERAPY: No  PAST MEDICAL HISTORY:  has a past medical history of Urinary tract infection;  Anemia; Breast cancer; Brain metastases; Hydronephrosis, left; Lung metastases; and Bone metastases.    PAST SURGICAL HISTORY: Past Surgical History  Procedure Laterality Date  . Cataract extraction  2013  . Mouth surgery  1981    FAMILY HISTORY: family history includes Cancer in her paternal uncle.  SOCIAL HISTORY:  reports that she has never smoked. She has never used smokeless tobacco. She reports that she drinks alcohol. She reports that she does not use illicit drugs.  ALLERGIES: Review of patient's allergies indicates no known allergies.  MEDICATIONS:  Current Outpatient Prescriptions  Medication Sig Dispense Refill  . aspirin 81 MG EC tablet Take 81 mg by mouth daily. Swallow whole.      . calcium carbonate (TUMS - DOSED IN MG ELEMENTAL CALCIUM) 500 MG chewable tablet Chew 1 tablet by mouth daily.      . FULVESTRANT IM Inject 500 mg into the muscle.      . letrozole (FEMARA) 2.5 MG tablet Take 1 tablet (2.5 mg total) by mouth daily.  90 tablet  4  . palbociclib (IBRANCE) 100 MG capsule Take 1 capsule (100 mg total) by mouth daily with breakfast. Take whole with food.  21 capsule  6   No current facility-administered medications for this encounter.    REVIEW OF SYSTEMS:  A 15 point review of systems is documented in the electronic medical record. This was obtained by the nursing staff. However, I reviewed this with the patient to discuss relevant findings and make appropriate changes.  A comprehensive review of systems was negative.   PHYSICAL EXAM:  height is 5' 6"  (1.676 m) and weight is 119 lb 4.8 oz (54.114 kg). Her blood pressure is 132/65 and her pulse is 99. Her respiration is 16.   Brief inspection reveals no overt neurologic deficits  KPS = 100  100 - Normal; no complaints; no evidence of disease. 90   - Able to carry on normal activity; minor signs or symptoms of disease. 80   - Normal activity with effort; some signs or symptoms of disease. 75   - Cares for self;  unable to carry on normal activity or to do active work. 60   - Requires occasional assistance, but is able to care for most of his personal needs. 50   - Requires considerable assistance and frequent medical care. 60   - Disabled; requires special care and assistance. 76   - Severely disabled; hospital admission is indicated although death not imminent. 28   - Very sick; hospital admission necessary; active supportive treatment necessary. 10   - Moribund; fatal processes progressing rapidly. 0     - Dead  Karnofsky DA, Abelmann Weston, Craver LS and Burchenal [facility] [phone]) The use of the nitrogen mustards in the palliative treatment of carcinoma: with particular reference to bronchogenic carcinoma Cancer 1 634-56  LABORATORY DATA:  Lab Results  Component Value Date   WBC 3.7* 12/16/2013   HGB 9.5* 12/16/2013   HCT 29.9* 12/16/2013   MCV 98.5 12/16/2013   PLT 386 12/16/2013   Lab Results  Component Value Date   NA 140 12/16/2013   K 4.5 12/16/2013   CO2 24 12/16/2013   Lab Results  Component Value Date   ALT 47 12/16/2013   AST  91* 12/16/2013   ALKPHOS 245* 12/16/2013   BILITOT 0.37 12/16/2013     RADIOGRAPHY: No results found.    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  Her brain metastasis has been stable for 4 weeks on anti-estrogen therapy.  She has a very large disease burden outside the brain that is more likely to govern her prognosis.  PLAN:Today, I talked to the patient and family about the findings and work-up thus far.  We discussed the natural history of brain metastases and general treatment, highlighting the role or radiotherapy and stereotactic radiosurgery in the management.  We discussed the available radiation techniques, and focused on the details of logistics and delivery.  We reviewed the anticipated acute and late  sequelae associated with radiation in this setting.  The patient was encouraged to ask questions that I answered to the best of my ability.  I filled out a patient counseling form during our discussion including treatment diagrams.  We retained a copy for our records.    I told the patient that Daggett could be performed now with extremely limited morbidity and a high likelihood for disease control.  SRS would not interfere with her systemic treatment schedule or efficacy.  I explained that it probably would be my preference to treat the brain metastasis now.  However, I am supportive of close surveillance if she does not wish to proceed with brain SRS, and a further delay may not carry substantial risk, given the slow growth rate seen in 4 weeks.  She does not wish to pursue SRS now.  She is willing to meet with neurosurgery to gather additional information.  If she decides against SRS now after talking with neurosurgery, we will plan to pursue follow-up MRI in 2 months and re-present her in brain tumor conference, then follow-up with neurosurgery and myself.  I spent 45 minutes minutes face to face with the patient and more than 50% of that time was spent in counseling and/or coordination of care.   ------------------------------------------------  Sheral Apley. Tammi Klippel, M.D.

## 2013-12-16 ENCOUNTER — Ambulatory Visit
Admission: RE | Admit: 2013-12-16 | Discharge: 2013-12-16 | Disposition: A | Discharge status: Home / Self Care | Payer: Medicare Other | Source: Ambulatory Visit | Attending: Radiation Oncology | Admitting: Radiation Oncology

## 2013-12-16 ENCOUNTER — Encounter: Payer: Self-pay | Admitting: Radiation Oncology

## 2013-12-16 ENCOUNTER — Other Ambulatory Visit (HOSPITAL_BASED_OUTPATIENT_CLINIC_OR_DEPARTMENT_OTHER): Payer: Medicare Other

## 2013-12-16 VITALS — BP 132/65 | HR 99 | Resp 16 | Ht 66.0 in | Wt 119.3 lb

## 2013-12-16 DIAGNOSIS — N1330 Unspecified hydronephrosis: Secondary | ICD-10-CM | POA: Diagnosis not present

## 2013-12-16 DIAGNOSIS — C773 Secondary and unspecified malignant neoplasm of axilla and upper limb lymph nodes: Secondary | ICD-10-CM

## 2013-12-16 DIAGNOSIS — C7931 Secondary malignant neoplasm of brain: Secondary | ICD-10-CM

## 2013-12-16 DIAGNOSIS — C7949 Secondary malignant neoplasm of other parts of nervous system: Secondary | ICD-10-CM

## 2013-12-16 DIAGNOSIS — Z7982 Long term (current) use of aspirin: Secondary | ICD-10-CM | POA: Insufficient documentation

## 2013-12-16 DIAGNOSIS — C7952 Secondary malignant neoplasm of bone marrow: Secondary | ICD-10-CM

## 2013-12-16 DIAGNOSIS — C50919 Malignant neoplasm of unspecified site of unspecified female breast: Secondary | ICD-10-CM

## 2013-12-16 DIAGNOSIS — Z170 Estrogen receptor positive status [ER+]: Secondary | ICD-10-CM | POA: Insufficient documentation

## 2013-12-16 DIAGNOSIS — C50412 Malignant neoplasm of upper-outer quadrant of left female breast: Secondary | ICD-10-CM

## 2013-12-16 DIAGNOSIS — Z510 Encounter for antineoplastic radiation therapy: Secondary | ICD-10-CM | POA: Insufficient documentation

## 2013-12-16 DIAGNOSIS — C50419 Malignant neoplasm of upper-outer quadrant of unspecified female breast: Secondary | ICD-10-CM | POA: Diagnosis not present

## 2013-12-16 DIAGNOSIS — C7951 Secondary malignant neoplasm of bone: Secondary | ICD-10-CM

## 2013-12-16 DIAGNOSIS — C787 Secondary malignant neoplasm of liver and intrahepatic bile duct: Secondary | ICD-10-CM

## 2013-12-16 LAB — CBC WITH DIFFERENTIAL/PLATELET
BASO%: 4.6 % — AB (ref 0.0–2.0)
Basophils Absolute: 0.2 10*3/uL — ABNORMAL HIGH (ref 0.0–0.1)
EOS ABS: 0 10*3/uL (ref 0.0–0.5)
EOS%: 0.7 % (ref 0.0–7.0)
HCT: 29.9 % — ABNORMAL LOW (ref 34.8–46.6)
HGB: 9.5 g/dL — ABNORMAL LOW (ref 11.6–15.9)
LYMPH#: 1.3 10*3/uL (ref 0.9–3.3)
LYMPH%: 35.6 % (ref 14.0–49.7)
MCH: 31.2 pg (ref 25.1–34.0)
MCHC: 31.7 g/dL (ref 31.5–36.0)
MCV: 98.5 fL (ref 79.5–101.0)
MONO#: 0.9 10*3/uL (ref 0.1–0.9)
MONO%: 23.8 % — ABNORMAL HIGH (ref 0.0–14.0)
NEUT#: 1.3 10*3/uL — ABNORMAL LOW (ref 1.5–6.5)
NEUT%: 35.3 % — ABNORMAL LOW (ref 38.4–76.8)
Platelets: 386 10*3/uL (ref 145–400)
RBC: 3.04 10*6/uL — AB (ref 3.70–5.45)
RDW: 19.7 % — ABNORMAL HIGH (ref 11.2–14.5)
WBC: 3.7 10*3/uL — AB (ref 3.9–10.3)

## 2013-12-16 LAB — COMPREHENSIVE METABOLIC PANEL (CC13)
ALBUMIN: 3.3 g/dL — AB (ref 3.5–5.0)
ALT: 47 U/L (ref 0–55)
ANION GAP: 9 meq/L (ref 3–11)
AST: 91 U/L — ABNORMAL HIGH (ref 5–34)
Alkaline Phosphatase: 245 U/L — ABNORMAL HIGH (ref 40–150)
BILIRUBIN TOTAL: 0.37 mg/dL (ref 0.20–1.20)
BUN: 20.1 mg/dL (ref 7.0–26.0)
CO2: 24 mEq/L (ref 22–29)
Calcium: 8.6 mg/dL (ref 8.4–10.4)
Chloride: 106 mEq/L (ref 98–109)
Creatinine: 1.1 mg/dL (ref 0.6–1.1)
Glucose: 120 mg/dl (ref 70–140)
Potassium: 4.5 mEq/L (ref 3.5–5.1)
SODIUM: 140 meq/L (ref 136–145)
TOTAL PROTEIN: 7.3 g/dL (ref 6.4–8.3)

## 2013-12-16 NOTE — Progress Notes (Signed)
See progress note under physician encounter. 

## 2013-12-16 NOTE — Progress Notes (Signed)
Seen by Dr. Valere Dross to discuss radiation following future breast surgery. Reports a 10 lb weight loss over the last two months. Scheduled to see Nunkumar on 7/20 at 2 pm. Also, scheduled to see Dalstedht on 7/22 reference hydronephrosis. 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 WBC down on 12/02/2013 thus Ibrance was stopped. Repeat labs done today. Denies headache, dizziness, nausea, vomiting, diplopia or ringing in the ears. Steady gait noted. Answers all questions quickly and appropriately. Denies decline in short term memory. Taking femara daily as directed.

## 2013-12-17 ENCOUNTER — Other Ambulatory Visit: Payer: Self-pay | Admitting: Oncology

## 2013-12-17 ENCOUNTER — Ambulatory Visit: Admission: RE | Admit: 2013-12-17 | Payer: Medicare Other | Source: Ambulatory Visit

## 2013-12-17 ENCOUNTER — Telehealth: Payer: Self-pay

## 2013-12-17 ENCOUNTER — Ambulatory Visit: Admission: RE | Admit: 2013-12-17 | Payer: Medicare Other | Source: Ambulatory Visit | Admitting: Radiation Oncology

## 2013-12-17 NOTE — Telephone Encounter (Signed)
Faxed records to [facility].  Sent to scan.

## 2013-12-18 ENCOUNTER — Other Ambulatory Visit: Payer: Self-pay | Admitting: Radiation Therapy

## 2013-12-18 DIAGNOSIS — C7931 Secondary malignant neoplasm of brain: Secondary | ICD-10-CM

## 2013-12-18 DIAGNOSIS — C7949 Secondary malignant neoplasm of other parts of nervous system: Principal | ICD-10-CM

## 2013-12-22 ENCOUNTER — Other Ambulatory Visit (HOSPITAL_BASED_OUTPATIENT_CLINIC_OR_DEPARTMENT_OTHER): Payer: Medicare Other

## 2013-12-22 ENCOUNTER — Ambulatory Visit: Payer: Medicare Other | Admitting: Radiation Oncology

## 2013-12-22 DIAGNOSIS — C50412 Malignant neoplasm of upper-outer quadrant of left female breast: Secondary | ICD-10-CM

## 2013-12-22 DIAGNOSIS — C50419 Malignant neoplasm of upper-outer quadrant of unspecified female breast: Secondary | ICD-10-CM

## 2013-12-22 DIAGNOSIS — C50919 Malignant neoplasm of unspecified site of unspecified female breast: Secondary | ICD-10-CM

## 2013-12-22 DIAGNOSIS — C7951 Secondary malignant neoplasm of bone: Secondary | ICD-10-CM

## 2013-12-22 DIAGNOSIS — C7931 Secondary malignant neoplasm of brain: Secondary | ICD-10-CM

## 2013-12-22 LAB — COMPREHENSIVE METABOLIC PANEL (CC13)
ALT: 54 U/L (ref 0–55)
ANION GAP: 10 meq/L (ref 3–11)
AST: 85 U/L — ABNORMAL HIGH (ref 5–34)
Albumin: 3.2 g/dL — ABNORMAL LOW (ref 3.5–5.0)
Alkaline Phosphatase: 257 U/L — ABNORMAL HIGH (ref 40–150)
BILIRUBIN TOTAL: 0.37 mg/dL (ref 0.20–1.20)
BUN: 20.4 mg/dL (ref 7.0–26.0)
CO2: 24 meq/L (ref 22–29)
CREATININE: 1 mg/dL (ref 0.6–1.1)
Calcium: 9 mg/dL (ref 8.4–10.4)
Chloride: 106 mEq/L (ref 98–109)
Glucose: 87 mg/dl (ref 70–140)
Potassium: 4.4 mEq/L (ref 3.5–5.1)
Sodium: 140 mEq/L (ref 136–145)
TOTAL PROTEIN: 7 g/dL (ref 6.4–8.3)

## 2013-12-22 LAB — CBC WITH DIFFERENTIAL/PLATELET
BASO%: 2.9 % — AB (ref 0.0–2.0)
Basophils Absolute: 0.2 10*3/uL — ABNORMAL HIGH (ref 0.0–0.1)
EOS%: 2.3 % (ref 0.0–7.0)
Eosinophils Absolute: 0.1 10*3/uL (ref 0.0–0.5)
HEMATOCRIT: 29.3 % — AB (ref 34.8–46.6)
HGB: 9.4 g/dL — ABNORMAL LOW (ref 11.6–15.9)
LYMPH#: 1.5 10*3/uL (ref 0.9–3.3)
LYMPH%: 29.1 % (ref 14.0–49.7)
MCH: 31 pg (ref 25.1–34.0)
MCHC: 32 g/dL (ref 31.5–36.0)
MCV: 96.9 fL (ref 79.5–101.0)
MONO#: 1.1 10*3/uL — AB (ref 0.1–0.9)
MONO%: 19.9 % — ABNORMAL HIGH (ref 0.0–14.0)
NEUT#: 2.4 10*3/uL (ref 1.5–6.5)
NEUT%: 45.8 % (ref 38.4–76.8)
PLATELETS: 515 10*3/uL — AB (ref 145–400)
RBC: 3.03 10*6/uL — AB (ref 3.70–5.45)
RDW: 20.5 % — ABNORMAL HIGH (ref 11.2–14.5)
WBC: 5.3 10*3/uL (ref 3.9–10.3)

## 2013-12-23 ENCOUNTER — Other Ambulatory Visit: Payer: Medicare Other

## 2013-12-30 ENCOUNTER — Other Ambulatory Visit (HOSPITAL_BASED_OUTPATIENT_CLINIC_OR_DEPARTMENT_OTHER): Payer: Medicare Other

## 2013-12-30 DIAGNOSIS — C50419 Malignant neoplasm of upper-outer quadrant of unspecified female breast: Secondary | ICD-10-CM

## 2013-12-30 DIAGNOSIS — C50412 Malignant neoplasm of upper-outer quadrant of left female breast: Secondary | ICD-10-CM

## 2013-12-30 LAB — CBC WITH DIFFERENTIAL/PLATELET
BASO%: 2.5 % — AB (ref 0.0–2.0)
Basophils Absolute: 0.1 10*3/uL (ref 0.0–0.1)
EOS%: 3 % (ref 0.0–7.0)
Eosinophils Absolute: 0.2 10*3/uL (ref 0.0–0.5)
HEMATOCRIT: 29.4 % — AB (ref 34.8–46.6)
HGB: 9.4 g/dL — ABNORMAL LOW (ref 11.6–15.9)
LYMPH#: 1.5 10*3/uL (ref 0.9–3.3)
LYMPH%: 25.3 % (ref 14.0–49.7)
MCH: 31.2 pg (ref 25.1–34.0)
MCHC: 32.1 g/dL (ref 31.5–36.0)
MCV: 97.4 fL (ref 79.5–101.0)
MONO#: 1 10*3/uL — AB (ref 0.1–0.9)
MONO%: 17.2 % — ABNORMAL HIGH (ref 0.0–14.0)
NEUT#: 3 10*3/uL (ref 1.5–6.5)
NEUT%: 52 % (ref 38.4–76.8)
Platelets: 407 10*3/uL — ABNORMAL HIGH (ref 145–400)
RBC: 3.02 10*6/uL — ABNORMAL LOW (ref 3.70–5.45)
RDW: 20.9 % — ABNORMAL HIGH (ref 11.2–14.5)
WBC: 5.8 10*3/uL (ref 3.9–10.3)

## 2014-01-04 ENCOUNTER — Other Ambulatory Visit: Payer: Self-pay

## 2014-01-05 ENCOUNTER — Other Ambulatory Visit: Payer: Self-pay | Admitting: *Deleted

## 2014-01-05 DIAGNOSIS — C50412 Malignant neoplasm of upper-outer quadrant of left female breast: Secondary | ICD-10-CM

## 2014-01-06 ENCOUNTER — Other Ambulatory Visit (HOSPITAL_BASED_OUTPATIENT_CLINIC_OR_DEPARTMENT_OTHER): Payer: Medicare Other

## 2014-01-06 ENCOUNTER — Ambulatory Visit (HOSPITAL_BASED_OUTPATIENT_CLINIC_OR_DEPARTMENT_OTHER): Payer: Medicare Other | Admitting: Oncology

## 2014-01-06 ENCOUNTER — Ambulatory Visit (HOSPITAL_BASED_OUTPATIENT_CLINIC_OR_DEPARTMENT_OTHER): Payer: Medicare Other

## 2014-01-06 ENCOUNTER — Telehealth: Payer: Self-pay | Admitting: Oncology

## 2014-01-06 VITALS — BP 158/75 | HR 86 | Temp 98.6°F | Resp 18 | Ht 66.0 in | Wt 119.7 lb

## 2014-01-06 VITALS — BP 156/74 | HR 90 | Temp 98.1°F

## 2014-01-06 DIAGNOSIS — C7952 Secondary malignant neoplasm of bone marrow: Secondary | ICD-10-CM

## 2014-01-06 DIAGNOSIS — C50412 Malignant neoplasm of upper-outer quadrant of left female breast: Secondary | ICD-10-CM

## 2014-01-06 DIAGNOSIS — C7949 Secondary malignant neoplasm of other parts of nervous system: Secondary | ICD-10-CM

## 2014-01-06 DIAGNOSIS — C7951 Secondary malignant neoplasm of bone: Secondary | ICD-10-CM

## 2014-01-06 DIAGNOSIS — C50419 Malignant neoplasm of upper-outer quadrant of unspecified female breast: Secondary | ICD-10-CM

## 2014-01-06 DIAGNOSIS — C787 Secondary malignant neoplasm of liver and intrahepatic bile duct: Secondary | ICD-10-CM

## 2014-01-06 DIAGNOSIS — Z5111 Encounter for antineoplastic chemotherapy: Secondary | ICD-10-CM

## 2014-01-06 DIAGNOSIS — C50919 Malignant neoplasm of unspecified site of unspecified female breast: Secondary | ICD-10-CM

## 2014-01-06 DIAGNOSIS — N1330 Unspecified hydronephrosis: Secondary | ICD-10-CM

## 2014-01-06 DIAGNOSIS — C7931 Secondary malignant neoplasm of brain: Secondary | ICD-10-CM

## 2014-01-06 LAB — CBC WITH DIFFERENTIAL/PLATELET
BASO%: 1.8 % (ref 0.0–2.0)
BASOS ABS: 0.1 10*3/uL (ref 0.0–0.1)
EOS%: 5 % (ref 0.0–7.0)
Eosinophils Absolute: 0.3 10*3/uL (ref 0.0–0.5)
HEMATOCRIT: 31.2 % — AB (ref 34.8–46.6)
HEMOGLOBIN: 9.7 g/dL — AB (ref 11.6–15.9)
LYMPH#: 1.7 10*3/uL (ref 0.9–3.3)
LYMPH%: 30.1 % (ref 14.0–49.7)
MCH: 30.7 pg (ref 25.1–34.0)
MCHC: 31.1 g/dL — ABNORMAL LOW (ref 31.5–36.0)
MCV: 98.7 fL (ref 79.5–101.0)
MONO#: 0.7 10*3/uL (ref 0.1–0.9)
MONO%: 11.6 % (ref 0.0–14.0)
NEUT%: 51.5 % (ref 38.4–76.8)
NEUTROS ABS: 2.9 10*3/uL (ref 1.5–6.5)
Platelets: 341 10*3/uL (ref 145–400)
RBC: 3.16 10*6/uL — ABNORMAL LOW (ref 3.70–5.45)
RDW: 19.2 % — ABNORMAL HIGH (ref 11.2–14.5)
WBC: 5.6 10*3/uL (ref 3.9–10.3)
nRBC: 0 % (ref 0–0)

## 2014-01-06 LAB — COMPREHENSIVE METABOLIC PANEL (CC13)
ALBUMIN: 3.4 g/dL — AB (ref 3.5–5.0)
ALT: 50 U/L (ref 0–55)
AST: 77 U/L — ABNORMAL HIGH (ref 5–34)
Alkaline Phosphatase: 270 U/L — ABNORMAL HIGH (ref 40–150)
Anion Gap: 8 mEq/L (ref 3–11)
BUN: 20.1 mg/dL (ref 7.0–26.0)
CALCIUM: 9.6 mg/dL (ref 8.4–10.4)
CHLORIDE: 106 meq/L (ref 98–109)
CO2: 26 mEq/L (ref 22–29)
Creatinine: 1 mg/dL (ref 0.6–1.1)
GLUCOSE: 131 mg/dL (ref 70–140)
POTASSIUM: 4.3 meq/L (ref 3.5–5.1)
Sodium: 140 mEq/L (ref 136–145)
TOTAL PROTEIN: 7.2 g/dL (ref 6.4–8.3)
Total Bilirubin: 0.53 mg/dL (ref 0.20–1.20)

## 2014-01-06 MED ORDER — DENOSUMAB 120 MG/1.7ML ~~LOC~~ SOLN
120.0000 mg | Freq: Once | SUBCUTANEOUS | Status: AC
  Administered 2014-01-06: 120 mg via SUBCUTANEOUS
  Filled 2014-01-06: qty 1.7

## 2014-01-06 MED ORDER — PALBOCICLIB 100 MG PO CAPS: 100.0000 mg | ORAL_CAPSULE | Freq: Every day | ORAL | Status: DC

## 2014-01-06 MED ORDER — FULVESTRANT 250 MG/5ML IM SOLN
500.0000 mg | Freq: Once | INTRAMUSCULAR | Status: AC
  Administered 2014-01-06: 500 mg via INTRAMUSCULAR
  Filled 2014-01-06: qty 10

## 2014-01-06 NOTE — Progress Notes (Signed)
Vicki Marshall  [phone] [phone]     ID: Vicki Marshall OB: [birth year]  MR#: [id number]  UDJ#:[id number]  PCP: Vicki Coma, MD GYN:   SU: Vicki Marshall OTHER MD: Vicki Marshall, Vicki Marshall, Vicki Marshall DDS, Vicki Marshall, Vicki Marshall  CHIEF COMPLAINT: stage IV breast cancer, estrogen receptor positive  TREATMENT: Letrozole, fulvestrant, Palbociclib, and denosumab  BREAST CANCER HISTORY: From the original intake note 11/04/2013:  Vicki Marshall noted a change in her left breast approximately mid 2014. This was a flattening of the lateral aspect of the left breast. Eventually she noted an indentation and eventually a "sore" in that area. She had not shared this information with her husband, and she did not have a primary care physician. More recently, as she started to have symptoms related to this, she mentioned it to her husband Vicki Marshall and she establish yourself with Dr. Stephanie Marshall, who set her up for bilateral diagnostic mammography and ultrasonography at the [facility] 10/23/2013.. This showed a breast density to be category C. The right breast was negative.  The left breast contained an ulcerated mass laterally measuring at least 4 cm mammography. On physical exam there was an ulcerated mass in the 2:00 location of the left breast, associated with erythematous skin nodules in the upper inner quadrant. The left breast was smaller than the right, and firm. There were palpable left axillary lymph nodes. In addition, on the right upper chest wall, infraclavicular like, there was a firm nodule separate from the bony structures. There was also a right preauricular node and a firm left submandibular node.  Ultrasound showed multiple nodules throughout the lower portion of the left breast. There was a large mass in the upper outer quadrant of the left breast contiguous with the skin ulceration, measuring greater than 5 cm. The left axilla showed  at least 3 suspicious masses, the largest measuring 1.9 cm. Ultrasound of the palpable abnormality of the right chest wall showed a superficial irregular mass, subdermal, measuring 0.8 cm.  Biopsies of the left breast mass, a left axillary lymph node, and the right infra-clavicular mass on 10/23/2013, all showed ([id number]) and invasive ductal carcinoma, grade 1 or 2, estrogen receptor 100% positive, progesterone receptor 23% positive, both with strong staining intensity, with an MIB-1 of 61%, and no HER-2 amplification, the signals ratio being 0.90, and the number per cell 1.80.  On 11/02/2013 the patient underwent bilateral breast MRI and this showed, in the left breast, a 5.8 cm mass eroding through the overlying skin, and also invading the underlying pectoralis muscle and chest wall. There were multiple enhancing nodules throughout the left breast and an adjacent 4.8 cm irregular enhancing mass inferiorly. There are multiple large irregular left axillary lymph nodes, the largest measuring 2.4 cm. There was a 0.9 cm left subpectoral lymph node as well as prevascular and precarinal lymph nodes.  In the right breast far upper inner quadrant there was a 0.8 cm irregular enhancing mass abutting the overlying skin. This is the mass that was biopsied and shown to be positive. In addition there was patchy heterogeneous enhancement of the sternum.  The patient's subsequent history is as detailed below  INTERVAL HISTORY: Vicki Marshall returns today for followup of her breast cancer accompanied by her husband Vicki Marshall. After her initial visit here she met with Vicki Marshall and he set her up for a repeat brain MRI in July, which showed the small metastatic lesion there to be stable. Immediate treatment was discussed, but the  decision was for followup and Vicki Marshall is scheduled for repeat MRI next month. She also met with Dr. Diona Marshall. We confirmed the right hydronephrosis, explained to the stenting procedure, but felt less  she started chemotherapy or had a significant change in her creatinine likely that would not be necessary. Vicki Marshall started Palbociclib but I don't 25 mg daily but developed significant neutropenia and that was stopped.  REVIEW OF SYSTEMS: Vicki Marshall tolerates the fulvestrant with some discomfort from the shunt itself but no other side effects. She may have a little bit of "achiness" from the letrozole, but no vaginal dryness or hot flashes. She had no problems from the Palbociclib aside from the low counts. She was able to obtain it and essentially at no cost. She tells me her energy is a little better because she decided to start a walking program. Because it is so hot outside she mostly walks inside the house. She does about 20 minutes a day. She really feels stronger and she says her appetite is better as a result. A detailed review of systems today was otherwise negative  PAST MEDICAL HISTORY: Past Medical History  Diagnosis Date  . Urinary tract infection   . Anemia   . Breast cancer     T3N2M1 stage IV invasive ductal carcinoma   . Brain metastases   . Hydronephrosis, left   . Lung metastases   . Bone metastases     PAST SURGICAL HISTORY: Past Surgical History  Procedure Laterality Date  . Cataract extraction  2013  . Mouth surgery  1981    FAMILY HISTORY: The patient's father died at the age of 60, from pneumonia. The patient's mother lived to be 92. The patient had no brothers, one sister. There is no history of breast or ovarian cancer in the family  GYNECOLOGIC HISTORY:  Menarche age 68. The patient is GX P0. She stopped having periods approximately 2003. She did not take hormone replacement. She used birth control pills for approximately 8 years remotely, with no complications   SOCIAL HISTORY:  Vicki Marshall retired about a year ago. 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 Her husband Vicki Marshall is retired from working in [employer] fundraiser. They live alone, with  no pets.    ADVANCED DIRECTIVES: Not in place   HEALTH MAINTENANCE: History  Substance Use Topics  . Smoking status: Never Smoker   . Smokeless tobacco: Never Used  . Alcohol Use: Yes     Comment: 10 12 glasses/week     Colonoscopy: Never  PAP: 1990  Bone density: Never  Lipid panel:  No Known Allergies  Current Outpatient Prescriptions  Medication Sig Dispense Refill  . aspirin 81 MG EC tablet Take 81 mg by mouth daily. Swallow whole.      . calcium carbonate (TUMS - DOSED IN MG ELEMENTAL CALCIUM) 500 MG chewable tablet Chew 1 tablet by mouth daily.      . FULVESTRANT IM Inject 500 mg into the muscle.      . letrozole (FEMARA) 2.5 MG tablet Take 1 tablet (2.5 mg total) by mouth daily.  90 tablet  4  . palbociclib (IBRANCE) 100 MG capsule Take 1 capsule (100 mg total) by mouth daily with breakfast. Take whole with food.  21 capsule  6   No current facility-administered medications for this visit.    OBJECTIVE: Middle-aged white woman in no acute distress Filed Vitals:   01/06/14 1651  BP: 158/75  Pulse: 86  Temp: 98.6 F (37  C)  Resp: 18     Body mass index is 19.33 kg/(m^2).    ECOG FS:1 - Symptomatic but completely ambulatory  Sclerae unicteric, pupils equal and reactive Oropharynx clear and moist No cervical or supraclavicular adenopathy Lungs no rales or rhonchi Heart regular rate and rhythm Abd soft, nontender, positive bowel sounds MSK no focal spinal tenderness, no upper extremity lymphedema Neuro: nonfocal, well oriented, appropriate affect Breasts: The right breast is unremarkable. The left breast is photographed below. It appears slightly more mobile, but considerably less inflamed than prior. There is fixed left axillary adenopathy still palpable  .   LAB RESULTS:  CMP     Component Value Date/Time   NA 140 01/06/2014 1503   K 4.3 01/06/2014 1503   CO2 26 01/06/2014 1503   GLUCOSE 131 01/06/2014 1503   BUN 20.1 01/06/2014 1503   CREATININE 1.0  01/06/2014 1503   CALCIUM 9.6 01/06/2014 1503   PROT 7.2 01/06/2014 1503   ALBUMIN 3.4* 01/06/2014 1503   AST 77* 01/06/2014 1503   ALT 50 01/06/2014 1503   ALKPHOS 270* 01/06/2014 1503   BILITOT 0.53 01/06/2014 1503    I No results found for this basename: SPEP,  UPEP,   kappa and lambda light chains    Lab Results  Component Value Date   WBC 5.6 01/06/2014   NEUTROABS 2.9 01/06/2014   HGB 9.7* 01/06/2014   HCT 31.2* 01/06/2014   MCV 98.7 01/06/2014   PLT 341 01/06/2014      Chemistry      Component Value Date/Time   NA 140 01/06/2014 1503   K 4.3 01/06/2014 1503   CO2 26 01/06/2014 1503   BUN 20.1 01/06/2014 1503   CREATININE 1.0 01/06/2014 1503      Component Value Date/Time   CALCIUM 9.6 01/06/2014 1503   ALKPHOS 270* 01/06/2014 1503   AST 77* 01/06/2014 1503   ALT 50 01/06/2014 1503   BILITOT 0.53 01/06/2014 1503       No results found for this basename: LABCA2    No components found with this basename: LABCA125    No results found for this basename: INR,  in the last 168 hours  Urinalysis No results found for this basename: colorurine,  appearanceur,  labspec,  phurine,  glucoseu,  hgbur,  bilirubinur,  ketonesur,  proteinur,  urobilinogen,  nitrite,  leukocytesur    STUDIES: Mr Kizzie Fantasia Contrast  12/23/13   CLINICAL DATA:  68 year old female with stage IV breast cancer. Left cerebellar metastasis suspected on staging study with osseous metastatic disease also noted. Planning for stereotactic radiosurgery. Subsequent encounter.  EXAM: MRI HEAD WITHOUT AND WITH CONTRAST  TECHNIQUE: Multiplanar, multiecho pulse sequences of the brain and surrounding structures were obtained without and with intravenous contrast.  CONTRAST:  82m MULTIHANCE GADOBENATE DIMEGLUMINE 529 MG/ML IV SOLN  COMPARISON:  Brain MRI 11/05/2013.  FINDINGS: Abnormal focus of enhancement in the left cerebellum re- identified, series 10, image 29) and compatible with small metastasis. This is seen in the other post-contrast imaging  planes also (series 11, image 12).  No other abnormal enhancement of the brain identified.  Incidental developmental venous anomaly right anterior inferior frontal gyrus re- identified.  Diffusely abnormal bone marrow signal suggesting diffuse osseous metastatic disease. Confluent skullbase and cervical spine involvement.  No dural thickening.  No leptomeningeal enhancement identified.  Major intracranial vascular flow voids are stable. No restricted diffusion to suggest acute infarction. No midline shift, mass effect, ventriculomegaly, extra-axial collection or  acute intracranial hemorrhage. Cervicomedullary junction and pituitary are within normal limits. Stable scattered nonspecific cerebral white matter T2 and FLAIR hyperintensity. Visible internal auditory structures appear normal. Mastoids are clear. Paranasal sinuses are clear. Stable orbits soft tissues. Visualized scalp soft tissues are within normal limits.  IMPRESSION: 1. Stable solitary left cerebellar enhancing lesion compatible with small brain metastasis. No edema or mass effect. 2. Diffuse osseous metastatic disease. Confluent skullbase and upper cervical spine involvement. 3. No new intracranial abnormality identified.   Electronically Signed   By: Lars Pinks M.D.   On: 12/15/2013 15:58    ASSESSMENT: 68 y.o. [place] woman status post left breast in left axillary lymph node biopsy as well as right infraclavicular chest wall biopsy 10/23/2013 for a clinical T3, N2, M1, stage IV invasive ductal carcinoma, grade 2, estrogen receptor 100% positive, progesterone receptor 23% positive, with an MIB-1 of 61%, and no HER-2 amplification.  (1) staging studies 11/05/2013 showed metastatic involvement of bone, liver, lungs, and brain  (2) antiestrogen therapy with letrozole and fulvestrant as well as targeted therapy with palbociclib started 11/11/2013  (3) denosumab monthly started 11/11/2013  (4) Right hydronephrosis w/o obstruction-- followed by  urology ([facility]stedt)    PLAN: Hildur is tolerating her multi-agent therapy remarkably well. She is due for repeat brain MRI mid September. I am going to obtain a restaging PET scan the first week in September to reevaluate the measurable disease outside the brain. She understands with these agents we do not expect a rapid response. The goal is control, so if we see no growth that is already favorable. If we see shrink as you, of course that is even better.  Her counts dropped with the 125 mg dose of although sig lip. We are going to restart her at 100 mg and we will go back to checking blood counts on a weekly basis for the first 6 weeks. She knows to call if a fever or bleeding problem develops.  She'll see me again September 2. Baelyn has a good understanding of the overall plan, and agrees with it.  Chauncey Cruel, MD   01/06/2014 5:13 PM

## 2014-01-06 NOTE — Telephone Encounter (Signed)
per pof top ch appts-sent Webb Silversmith email to override 5 slot on 9/2-will call pt once reply

## 2014-01-13 ENCOUNTER — Other Ambulatory Visit (HOSPITAL_BASED_OUTPATIENT_CLINIC_OR_DEPARTMENT_OTHER): Payer: Medicare Other

## 2014-01-13 ENCOUNTER — Other Ambulatory Visit: Payer: Self-pay | Admitting: *Deleted

## 2014-01-13 DIAGNOSIS — C50412 Malignant neoplasm of upper-outer quadrant of left female breast: Secondary | ICD-10-CM

## 2014-01-13 DIAGNOSIS — C50419 Malignant neoplasm of upper-outer quadrant of unspecified female breast: Secondary | ICD-10-CM

## 2014-01-13 LAB — CBC WITH DIFFERENTIAL/PLATELET
BASO%: 1.8 % (ref 0.0–2.0)
Basophils Absolute: 0.1 10*3/uL (ref 0.0–0.1)
EOS%: 5.6 % (ref 0.0–7.0)
Eosinophils Absolute: 0.2 10*3/uL (ref 0.0–0.5)
HCT: 30.7 % — ABNORMAL LOW (ref 34.8–46.6)
HGB: 9.7 g/dL — ABNORMAL LOW (ref 11.6–15.9)
LYMPH%: 27.1 % (ref 14.0–49.7)
MCH: 30.7 pg (ref 25.1–34.0)
MCHC: 31.4 g/dL — ABNORMAL LOW (ref 31.5–36.0)
MCV: 97.7 fL (ref 79.5–101.0)
MONO#: 0.3 10*3/uL (ref 0.1–0.9)
MONO%: 6.5 % (ref 0.0–14.0)
NEUT#: 2.6 10*3/uL (ref 1.5–6.5)
NEUT%: 59 % (ref 38.4–76.8)
Platelets: 385 10*3/uL (ref 145–400)
RBC: 3.14 10*6/uL — AB (ref 3.70–5.45)
RDW: 20.8 % — AB (ref 11.2–14.5)
WBC: 4.4 10*3/uL (ref 3.9–10.3)
lymph#: 1.2 10*3/uL (ref 0.9–3.3)

## 2014-01-18 ENCOUNTER — Ambulatory Visit: Payer: Medicare Other | Admitting: Radiation Oncology

## 2014-01-20 ENCOUNTER — Other Ambulatory Visit: Payer: Self-pay | Admitting: *Deleted

## 2014-01-20 ENCOUNTER — Other Ambulatory Visit (HOSPITAL_BASED_OUTPATIENT_CLINIC_OR_DEPARTMENT_OTHER): Payer: Medicare Other

## 2014-01-20 DIAGNOSIS — C50412 Malignant neoplasm of upper-outer quadrant of left female breast: Secondary | ICD-10-CM

## 2014-01-20 DIAGNOSIS — C50419 Malignant neoplasm of upper-outer quadrant of unspecified female breast: Secondary | ICD-10-CM

## 2014-01-20 LAB — COMPREHENSIVE METABOLIC PANEL (CC13)
ALT: 28 U/L (ref 0–55)
AST: 50 U/L — AB (ref 5–34)
Albumin: 3.5 g/dL (ref 3.5–5.0)
Alkaline Phosphatase: 207 U/L — ABNORMAL HIGH (ref 40–150)
Anion Gap: 9 mEq/L (ref 3–11)
BUN: 24.9 mg/dL (ref 7.0–26.0)
CALCIUM: 8.8 mg/dL (ref 8.4–10.4)
CHLORIDE: 106 meq/L (ref 98–109)
CO2: 25 mEq/L (ref 22–29)
Creatinine: 1.2 mg/dL — ABNORMAL HIGH (ref 0.6–1.1)
Glucose: 115 mg/dl (ref 70–140)
POTASSIUM: 4.6 meq/L (ref 3.5–5.1)
Sodium: 141 mEq/L (ref 136–145)
Total Bilirubin: 0.42 mg/dL (ref 0.20–1.20)
Total Protein: 7.4 g/dL (ref 6.4–8.3)

## 2014-01-20 LAB — CBC WITH DIFFERENTIAL/PLATELET
BASO%: 2.6 % — ABNORMAL HIGH (ref 0.0–2.0)
BASOS ABS: 0.1 10*3/uL (ref 0.0–0.1)
EOS ABS: 0.1 10*3/uL (ref 0.0–0.5)
EOS%: 3.8 % (ref 0.0–7.0)
HCT: 30.3 % — ABNORMAL LOW (ref 34.8–46.6)
HGB: 9.7 g/dL — ABNORMAL LOW (ref 11.6–15.9)
LYMPH#: 0.8 10*3/uL — AB (ref 0.9–3.3)
LYMPH%: 39.5 % (ref 14.0–49.7)
MCH: 31.8 pg (ref 25.1–34.0)
MCHC: 32.1 g/dL (ref 31.5–36.0)
MCV: 99.1 fL (ref 79.5–101.0)
MONO#: 0.2 10*3/uL (ref 0.1–0.9)
MONO%: 9.9 % (ref 0.0–14.0)
NEUT#: 0.9 10*3/uL — ABNORMAL LOW (ref 1.5–6.5)
NEUT%: 44.2 % (ref 38.4–76.8)
Platelets: 278 10*3/uL (ref 145–400)
RBC: 3.06 10*6/uL — ABNORMAL LOW (ref 3.70–5.45)
RDW: 22.2 % — AB (ref 11.2–14.5)
WBC: 2 10*3/uL — AB (ref 3.9–10.3)

## 2014-01-26 ENCOUNTER — Other Ambulatory Visit: Payer: Self-pay | Admitting: *Deleted

## 2014-01-26 DIAGNOSIS — C50412 Malignant neoplasm of upper-outer quadrant of left female breast: Secondary | ICD-10-CM

## 2014-01-27 ENCOUNTER — Other Ambulatory Visit (HOSPITAL_BASED_OUTPATIENT_CLINIC_OR_DEPARTMENT_OTHER): Payer: Medicare Other

## 2014-01-27 ENCOUNTER — Other Ambulatory Visit: Payer: Self-pay | Admitting: Nurse Practitioner

## 2014-01-27 ENCOUNTER — Telehealth: Payer: Self-pay | Admitting: Nurse Practitioner

## 2014-01-27 DIAGNOSIS — C7951 Secondary malignant neoplasm of bone: Secondary | ICD-10-CM

## 2014-01-27 DIAGNOSIS — C7931 Secondary malignant neoplasm of brain: Secondary | ICD-10-CM

## 2014-01-27 DIAGNOSIS — C7952 Secondary malignant neoplasm of bone marrow: Secondary | ICD-10-CM

## 2014-01-27 DIAGNOSIS — C50412 Malignant neoplasm of upper-outer quadrant of left female breast: Secondary | ICD-10-CM

## 2014-01-27 DIAGNOSIS — C50912 Malignant neoplasm of unspecified site of left female breast: Secondary | ICD-10-CM

## 2014-01-27 DIAGNOSIS — C787 Secondary malignant neoplasm of liver and intrahepatic bile duct: Secondary | ICD-10-CM

## 2014-01-27 DIAGNOSIS — C50419 Malignant neoplasm of upper-outer quadrant of unspecified female breast: Secondary | ICD-10-CM

## 2014-01-27 DIAGNOSIS — C7949 Secondary malignant neoplasm of other parts of nervous system: Secondary | ICD-10-CM

## 2014-01-27 LAB — CBC WITH DIFFERENTIAL/PLATELET
BASO%: 1.8 % (ref 0.0–2.0)
Basophils Absolute: 0 10*3/uL (ref 0.0–0.1)
EOS%: 1.7 % (ref 0.0–7.0)
Eosinophils Absolute: 0 10*3/uL (ref 0.0–0.5)
HCT: 29.9 % — ABNORMAL LOW (ref 34.8–46.6)
HGB: 9.6 g/dL — ABNORMAL LOW (ref 11.6–15.9)
LYMPH%: 50.7 % — ABNORMAL HIGH (ref 14.0–49.7)
MCH: 32.1 pg (ref 25.1–34.0)
MCHC: 32 g/dL (ref 31.5–36.0)
MCV: 100.2 fL (ref 79.5–101.0)
MONO#: 0.2 10*3/uL (ref 0.1–0.9)
MONO%: 11.9 % (ref 0.0–14.0)
NEUT%: 33.9 % — ABNORMAL LOW (ref 38.4–76.8)
NEUTROS ABS: 0.7 10*3/uL — AB (ref 1.5–6.5)
Platelets: 176 10*3/uL (ref 145–400)
RBC: 2.99 10*6/uL — ABNORMAL LOW (ref 3.70–5.45)
RDW: 24.1 % — AB (ref 11.2–14.5)
WBC: 1.9 10*3/uL — ABNORMAL LOW (ref 3.9–10.3)
lymph#: 1 10*3/uL (ref 0.9–3.3)

## 2014-01-27 LAB — COMPREHENSIVE METABOLIC PANEL (CC13)
ALBUMIN: 3.5 g/dL (ref 3.5–5.0)
ALK PHOS: 182 U/L — AB (ref 40–150)
ALT: 33 U/L (ref 0–55)
AST: 56 U/L — AB (ref 5–34)
Anion Gap: 10 mEq/L (ref 3–11)
BUN: 25.7 mg/dL (ref 7.0–26.0)
CO2: 23 mEq/L (ref 22–29)
Calcium: 8.9 mg/dL (ref 8.4–10.4)
Chloride: 107 mEq/L (ref 98–109)
Creatinine: 1.2 mg/dL — ABNORMAL HIGH (ref 0.6–1.1)
Glucose: 91 mg/dl (ref 70–140)
Potassium: 5 mEq/L (ref 3.5–5.1)
SODIUM: 139 meq/L (ref 136–145)
TOTAL PROTEIN: 7.3 g/dL (ref 6.4–8.3)
Total Bilirubin: 0.49 mg/dL (ref 0.20–1.20)

## 2014-01-27 MED ORDER — PALBOCICLIB 75 MG PO CAPS: 75.0000 mg | ORAL_CAPSULE | Freq: Every day | ORAL | Status: DC

## 2014-01-27 NOTE — Telephone Encounter (Signed)
Called [name] at 4:08pm. Her neutrophil count has dropped to 0.7. Her final dose of the 100mg  palbociclib for this cycle was actually this morning. She is now on her 7 days recovery and will follow up with Dr. Jana Hakim on 9/2. She will restart the drug at 75mg  daily x 21 days if her count has improved by that office visit. A new prescription was sent to her pharmacy today.

## 2014-01-28 ENCOUNTER — Telehealth: Payer: Self-pay | Admitting: Oncology

## 2014-02-02 ENCOUNTER — Ambulatory Visit (HOSPITAL_COMMUNITY)
Admission: RE | Admit: 2014-02-02 | Discharge: 2014-02-02 | Disposition: A | Discharge status: Home / Self Care | Payer: Medicare Other | Source: Ambulatory Visit | Attending: Oncology | Admitting: Oncology

## 2014-02-02 ENCOUNTER — Other Ambulatory Visit: Payer: Self-pay | Admitting: *Deleted

## 2014-02-02 ENCOUNTER — Encounter (HOSPITAL_COMMUNITY): Payer: Self-pay

## 2014-02-02 DIAGNOSIS — Z853 Personal history of malignant neoplasm of breast: Secondary | ICD-10-CM | POA: Diagnosis present

## 2014-02-02 DIAGNOSIS — C7952 Secondary malignant neoplasm of bone marrow: Secondary | ICD-10-CM | POA: Diagnosis not present

## 2014-02-02 DIAGNOSIS — C7800 Secondary malignant neoplasm of unspecified lung: Secondary | ICD-10-CM | POA: Diagnosis not present

## 2014-02-02 DIAGNOSIS — C50919 Malignant neoplasm of unspecified site of unspecified female breast: Secondary | ICD-10-CM

## 2014-02-02 DIAGNOSIS — C7951 Secondary malignant neoplasm of bone: Secondary | ICD-10-CM

## 2014-02-02 DIAGNOSIS — N1330 Unspecified hydronephrosis: Secondary | ICD-10-CM | POA: Insufficient documentation

## 2014-02-02 DIAGNOSIS — C50412 Malignant neoplasm of upper-outer quadrant of left female breast: Secondary | ICD-10-CM

## 2014-02-02 DIAGNOSIS — C7931 Secondary malignant neoplasm of brain: Secondary | ICD-10-CM | POA: Diagnosis not present

## 2014-02-02 DIAGNOSIS — C787 Secondary malignant neoplasm of liver and intrahepatic bile duct: Secondary | ICD-10-CM | POA: Diagnosis not present

## 2014-02-02 DIAGNOSIS — C7949 Secondary malignant neoplasm of other parts of nervous system: Secondary | ICD-10-CM

## 2014-02-02 LAB — GLUCOSE, CAPILLARY: GLUCOSE-CAPILLARY: 102 mg/dL — AB (ref 70–99)

## 2014-02-02 MED ORDER — FLUDEOXYGLUCOSE F - 18 (FDG) INJECTION
5.8000 | Freq: Once | INTRAVENOUS | Status: AC | PRN
Start: 2014-02-02 — End: 2014-02-02

## 2014-02-03 ENCOUNTER — Other Ambulatory Visit (HOSPITAL_BASED_OUTPATIENT_CLINIC_OR_DEPARTMENT_OTHER): Payer: Medicare Other

## 2014-02-03 ENCOUNTER — Ambulatory Visit (HOSPITAL_BASED_OUTPATIENT_CLINIC_OR_DEPARTMENT_OTHER): Payer: Medicare Other

## 2014-02-03 ENCOUNTER — Ambulatory Visit: Payer: Medicare Other

## 2014-02-03 ENCOUNTER — Ambulatory Visit (HOSPITAL_BASED_OUTPATIENT_CLINIC_OR_DEPARTMENT_OTHER): Payer: Medicare Other | Admitting: Oncology

## 2014-02-03 VITALS — BP 146/73 | HR 86 | Temp 98.3°F

## 2014-02-03 VITALS — BP 137/61 | HR 77 | Temp 97.9°F | Resp 20 | Ht 66.0 in | Wt 117.9 lb

## 2014-02-03 DIAGNOSIS — C7951 Secondary malignant neoplasm of bone: Secondary | ICD-10-CM

## 2014-02-03 DIAGNOSIS — C7949 Secondary malignant neoplasm of other parts of nervous system: Secondary | ICD-10-CM

## 2014-02-03 DIAGNOSIS — C50419 Malignant neoplasm of upper-outer quadrant of unspecified female breast: Secondary | ICD-10-CM

## 2014-02-03 DIAGNOSIS — C787 Secondary malignant neoplasm of liver and intrahepatic bile duct: Secondary | ICD-10-CM

## 2014-02-03 DIAGNOSIS — Z170 Estrogen receptor positive status [ER+]: Secondary | ICD-10-CM

## 2014-02-03 DIAGNOSIS — C7800 Secondary malignant neoplasm of unspecified lung: Secondary | ICD-10-CM

## 2014-02-03 DIAGNOSIS — C50919 Malignant neoplasm of unspecified site of unspecified female breast: Secondary | ICD-10-CM

## 2014-02-03 DIAGNOSIS — C50412 Malignant neoplasm of upper-outer quadrant of left female breast: Secondary | ICD-10-CM

## 2014-02-03 DIAGNOSIS — C7952 Secondary malignant neoplasm of bone marrow: Secondary | ICD-10-CM

## 2014-02-03 DIAGNOSIS — C7931 Secondary malignant neoplasm of brain: Secondary | ICD-10-CM

## 2014-02-03 DIAGNOSIS — Z5111 Encounter for antineoplastic chemotherapy: Secondary | ICD-10-CM

## 2014-02-03 LAB — CBC WITH DIFFERENTIAL/PLATELET
BASO%: 2.3 % — AB (ref 0.0–2.0)
Basophils Absolute: 0.1 10*3/uL (ref 0.0–0.1)
EOS%: 0.9 % (ref 0.0–7.0)
Eosinophils Absolute: 0 10*3/uL (ref 0.0–0.5)
HCT: 28.4 % — ABNORMAL LOW (ref 34.8–46.6)
HGB: 9.1 g/dL — ABNORMAL LOW (ref 11.6–15.9)
LYMPH%: 48.3 % (ref 14.0–49.7)
MCH: 32.4 pg (ref 25.1–34.0)
MCHC: 32.2 g/dL (ref 31.5–36.0)
MCV: 100.7 fL (ref 79.5–101.0)
MONO#: 0.6 10*3/uL (ref 0.1–0.9)
MONO%: 22.3 % — ABNORMAL HIGH (ref 0.0–14.0)
NEUT%: 26.2 % — ABNORMAL LOW (ref 38.4–76.8)
NEUTROS ABS: 0.7 10*3/uL — AB (ref 1.5–6.5)
PLATELETS: 210 10*3/uL (ref 145–400)
RBC: 2.82 10*6/uL — AB (ref 3.70–5.45)
RDW: 24.5 % — AB (ref 11.2–14.5)
WBC: 2.8 10*3/uL — AB (ref 3.9–10.3)
lymph#: 1.4 10*3/uL (ref 0.9–3.3)

## 2014-02-03 LAB — COMPREHENSIVE METABOLIC PANEL (CC13)
ALBUMIN: 3.4 g/dL — AB (ref 3.5–5.0)
ALT: 30 U/L (ref 0–55)
ANION GAP: 9 meq/L (ref 3–11)
AST: 49 U/L — ABNORMAL HIGH (ref 5–34)
Alkaline Phosphatase: 176 U/L — ABNORMAL HIGH (ref 40–150)
BILIRUBIN TOTAL: 0.33 mg/dL (ref 0.20–1.20)
BUN: 22 mg/dL (ref 7.0–26.0)
CO2: 24 meq/L (ref 22–29)
Calcium: 9.6 mg/dL (ref 8.4–10.4)
Chloride: 106 mEq/L (ref 98–109)
Creatinine: 1.2 mg/dL — ABNORMAL HIGH (ref 0.6–1.1)
Glucose: 104 mg/dl (ref 70–140)
POTASSIUM: 4.3 meq/L (ref 3.5–5.1)
SODIUM: 139 meq/L (ref 136–145)
TOTAL PROTEIN: 7.2 g/dL (ref 6.4–8.3)

## 2014-02-03 MED ORDER — DENOSUMAB 120 MG/1.7ML ~~LOC~~ SOLN
120.0000 mg | Freq: Once | SUBCUTANEOUS | Status: AC
  Administered 2014-02-03: 120 mg via SUBCUTANEOUS
  Filled 2014-02-03: qty 1.7

## 2014-02-03 MED ORDER — ALPRAZOLAM 0.5 MG PO TABS: 0.5000 mg | ORAL_TABLET | Freq: Every evening | ORAL | Status: AC | PRN

## 2014-02-03 MED ORDER — FULVESTRANT 250 MG/5ML IM SOLN
500.0000 mg | Freq: Once | INTRAMUSCULAR | Status: AC
  Administered 2014-02-03: 500 mg via INTRAMUSCULAR
  Filled 2014-02-03: qty 10

## 2014-02-03 NOTE — Progress Notes (Signed)
[place]  Telephone:([phone]) [phone] [phone]) [phone]     ID: Vicki Marshall OB: [birth year]  MR#: [id number]  KZS#:[id number]  PCP: Lilian Coma, MD GYN:   SU: Excell Seltzer OTHER MD: Arloa Koh, Altamese Cabal, Scott Minor DDS, Tyler Pita, Franchot Gallo  CHIEF COMPLAINT: stage IV breast cancer, estrogen receptor positive  TREATMENT: Letrozole, fulvestrant, Palbociclib, and denosumab  BREAST CANCER HISTORY: From the original intake note 11/04/2013:  Chloie noted a change in her left breast approximately mid 2014. This was a flattening of the lateral aspect of the left breast. Eventually she noted an indentation and eventually a "sore" in that area. She had not shared this information with her husband, and she did not have a primary care physician. More recently, as she started to have symptoms related to this, she mentioned it to her husband [name] and she establish yourself with Dr. Stephanie Acre, who set her up for bilateral diagnostic mammography and ultrasonography at the [facility] 10/23/2013.. This showed a breast density to be category C. The right breast was negative.  The left breast contained an ulcerated mass laterally measuring at least 4 cm mammography. On physical exam there was an ulcerated mass in the 2:00 location of the left breast, associated with erythematous skin nodules in the upper inner quadrant. The left breast was smaller than the right, and firm. There were palpable left axillary lymph nodes. In addition, on the right upper chest wall, infraclavicular like, there was a firm nodule separate from the bony structures. There was also a right preauricular node and a firm left submandibular node.  Ultrasound showed multiple nodules throughout the lower portion of the left breast. There was a large mass in the upper outer quadrant of the left breast contiguous with the skin ulceration, measuring greater than 5 cm. The left axilla showed  at least 3 suspicious masses, the largest measuring 1.9 cm. Ultrasound of the palpable abnormality of the right chest wall showed a superficial irregular mass, subdermal, measuring 0.8 cm.  Biopsies of the left breast mass, a left axillary lymph node, and the right infra-clavicular mass on 10/23/2013, all showed ([id number]) and invasive ductal carcinoma, grade 1 or 2, estrogen receptor 100% positive, progesterone receptor 23% positive, both with strong staining intensity, with an MIB-1 of 61%, and no HER-2 amplification, the signals ratio being 0.90, and the number per cell 1.80.  On 11/02/2013 the patient underwent bilateral breast MRI and this showed, in the left breast, a 5.8 cm mass eroding through the overlying skin, and also invading the underlying pectoralis muscle and chest wall. There were multiple enhancing nodules throughout the left breast and an adjacent 4.8 cm irregular enhancing mass inferiorly. There are multiple large irregular left axillary lymph nodes, the largest measuring 2.4 cm. There was a 0.9 cm left subpectoral lymph node as well as prevascular and precarinal lymph nodes.  In the right breast far upper inner quadrant there was a 0.8 cm irregular enhancing mass abutting the overlying skin. This is the mass that was biopsied and shown to be positive. In addition there was patchy heterogeneous enhancement of the sternum.  The patient's subsequent history is as detailed below  INTERVAL HISTORY: Keslee returns today for followup of her breast cancer accompanied by her husband [name]. 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 She is  tolerating her treatment remarkably well, with the exception of neutropenia from the Palbociclib. This of course is expected. Currently her dose is being held until neutrophil recovery at which  point she will be started at the next dose level.  REVIEW OF SYSTEMS: Angelina denies any headaches, visual changes, nausea, vomiting, dizziness, or gait imbalance. She has discomfort with a local injections from fulvestrant but no other side effects from that medication. She has not lost any hair from the Palbociclib, although she is aware that is a possibility. She tolerates the denosumab with no hypocalcemia or other symptoms. She doesn't have significant hot flashes or worsening vaginal dryness from the letrozole. She remains very active, though "still not as active as a used to be". A detailed review of systems today was otherwise stable  PAST MEDICAL HISTORY: Past Medical History  Diagnosis Date  . Urinary tract infection   . Anemia   . Breast cancer     T3N2M1 stage IV invasive ductal carcinoma   . Brain metastases   . Hydronephrosis, left   . Lung metastases   . Bone metastases     PAST SURGICAL HISTORY: Past Surgical History  Procedure Laterality Date  . Cataract extraction  2013  . Mouth surgery  1981    FAMILY HISTORY: The patient's father died at the age of 34, from pneumonia. The patient's mother lived to be 72. The patient had no brothers, one sister. There is no history of breast or ovarian cancer in the family  GYNECOLOGIC HISTORY:  Menarche age 39. The patient is GX P0. She stopped having periods approximately 2003. She did not take hormone replacement. She used birth control pills for approximately 8 years remotely, with no complications   SOCIAL HISTORY:  Adalind retired about a year ago. 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 Her husband [name] is retired from working in Charity fundraiser. They live alone, with no pets.    ADVANCED DIRECTIVES: Not in place   HEALTH MAINTENANCE: History  Substance Use Topics  . Smoking status: Never Smoker   . Smokeless tobacco: Never Used  . Alcohol Use: Yes     Comment: 10 12 glasses/week      Colonoscopy: Never  PAP: 1990  Bone density: Never  Lipid panel:  No Known Allergies  Current Outpatient Prescriptions  Medication Sig Dispense Refill  . aspirin 81 MG EC tablet Take 81 mg by mouth daily. Swallow whole.      . calcium carbonate (TUMS - DOSED IN MG ELEMENTAL CALCIUM) 500 MG chewable tablet Chew 1 tablet by mouth daily.      . FULVESTRANT IM Inject 500 mg into the muscle.      . letrozole (FEMARA) 2.5 MG tablet Take 1 tablet (2.5 mg total) by mouth daily.  90 tablet  4  . palbociclib (IBRANCE) 75 MG capsule Take 1 capsule (75 mg total) by mouth daily with breakfast. Take whole with food.  21 capsule  1   No current facility-administered medications for this visit.    OBJECTIVE: Middle-aged white woman who appears her stated age 68 Vitals:   02/03/14 1645  BP: 137/61  Pulse: 77  Temp: 97.9 F (36.6 C)  Resp: 20     Body mass index is 19.04 kg/(m^2).    ECOG FS:1 - Symptomatic but completely ambulatory  Sclerae unicteric, pupils round and equal Oropharynx clear and moist, teeth in good repair No cervical or supraclavicular adenopathy Lungs no rales or rhonchi Heart  regular rate and rhythm Abd soft, nontender, positive bowel sounds MSK no focal spinal tenderness, no upper extremity lymphedema Neuro: nonfocal, well oriented, positive affect Breasts: Deferred  Left breast photo from 01/06/2014   LAB RESULTS:  CMP     Component Value Date/Time   NA 139 02/03/2014 1505   K 4.3 02/03/2014 1505   CO2 24 02/03/2014 1505   GLUCOSE 104 02/03/2014 1505   BUN 22.0 02/03/2014 1505   CREATININE 1.2* 02/03/2014 1505   CALCIUM 9.6 02/03/2014 1505   PROT 7.2 02/03/2014 1505   ALBUMIN 3.4* 02/03/2014 1505   AST 49* 02/03/2014 1505   ALT 30 02/03/2014 1505   ALKPHOS 176* 02/03/2014 1505   BILITOT 0.33 02/03/2014 1505    I No results found for this basename: SPEP,  UPEP,   kappa and lambda light chains    Lab Results  Component Value Date   WBC 2.8* 02/03/2014   NEUTROABS  0.7* 02/03/2014   HGB 9.1* 02/03/2014   HCT 28.4* 02/03/2014   MCV 100.7 02/03/2014   PLT 210 02/03/2014      Chemistry      Component Value Date/Time   NA 139 02/03/2014 1505   K 4.3 02/03/2014 1505   CO2 24 02/03/2014 1505   BUN 22.0 02/03/2014 1505   CREATININE 1.2* 02/03/2014 1505      Component Value Date/Time   CALCIUM 9.6 02/03/2014 1505   ALKPHOS 176* 02/03/2014 1505   AST 49* 02/03/2014 1505   ALT 30 02/03/2014 1505   BILITOT 0.33 02/03/2014 1505       No results found for this basename: LABCA2    No components found with this basename: LABCA125    No results found for this basename: INR,  in the last 168 hours  Urinalysis No results found for this basename: colorurine,  appearanceur,  labspec,  phurine,  glucoseu,  hgbur,  bilirubinur,  ketonesur,  proteinur,  urobilinogen,  nitrite,  leukocytesur    STUDIES: Nm Pet Image Restag (ps) Skull Base To Thigh  02/02/2014   CLINICAL DATA:  Subsequent treatment strategy for breast cancer with metastasis to bone. Metastasis to brain. Left sided hydronephrosis. Lung metastasis.  EXAM: NUCLEAR MEDICINE PET SKULL BASE TO THIGH  TECHNIQUE: 5.8 mCi F-18 FDG was injected intravenously. Full-ring PET imaging was performed from the skull base to thigh after the radiotracer. CT data was obtained and used for attenuation correction and anatomic localization.  FASTING BLOOD GLUCOSE:  Value: 102 mg/dl  COMPARISON:  11/05/2013  FINDINGS: NECK  No areas of abnormal hypermetabolism.  CHEST  Hypermetabolic left axillary adenopathy. Nodes measure up to 9 mm and a S.U.V. max of 4.5 on image 67. On the prior exam, nodes measured up to 1.5 cm and a S.U.V. max of 9.8.  Left breast primary laterally measures 3.2 x 2.6 cm and a S.U.V. max of 9.0. Decreased in size from the prior exam, where it measured 4.2 x 3.9 cm and a S.U.V. max of 18.2. Adjacent skin thickening with hypermetabolism, suggesting a component of dermal involvement.  Near complete resolution of mediastinal and  hilar nodal hypermetabolism. A focus of left hilar hypermetabolism measures a S.U.V. max of 3.4 versus a S.U.V. max of 11.2 on the prior. Hypermetabolic pulmonary nodules have resolved.  ABDOMEN/PELVIS  Hepatic metastasis. An index right liver lobe lesion measures 1.2 cm and a S.U.V. max of 6.4 on image 109. Similar in size to on the prior exam, where it measured a S.U.V. max of 5.2.  The lesion just anterior to this in the right lobe of the liver measures 1.3 cm and a S.U.V. max of 5.3 on image 110. Not readily apparent on the prior CT, where it measured a S.U.V. max of 6.0.  Hypermetabolic lesion in the medial left liver lobe measures 1.6 cm and a S.U.V. max of 6.7 today. S.U.V. max of 4.7 on the prior exam.  SKELETON  Extensive osseous metastasis. An index hypermetabolic focus involving the left side of the sacrum measures a S.U.V. max of 4.9 versus a S.U.V. max of 9.0 on the prior. Similar decreased hypermetabolism throughout the thoracolumbar spine.  CT IMAGES PERFORMED FOR ATTENUATION CORRECTION  No cervical adenopathy. Mild cardiomegaly. Decrease in pulmonary nodules. Right lower lobe 1.1 cm nodule on image 48 measures 1.4 cm on the prior. Right lower lobe 4 mm nodule on image 43 measures 7 mm on the prior  Similar moderate right-sided hydronephrosis with overlying cortical thinning. No definite cause identified. Pelvic floor laxity. Progressive left-sided posterior rib fractures, including a displaced fracture at the seventh.  IMPRESSION: 1. Response to therapy of left breast primary, axillary adenopathy, thoracic nodal metastasis, and pulmonary metastasis. 2. Hepatic metastasis which are felt to be minimally progressive. 3. Slight improvement in osseous metastasis. 4. Similar moderate right-sided hydronephrosis, without cause identified. Question chronic UPJ obstruction.   Electronically Signed   By: Abigail Miyamoto M.D.   On: 02/02/2014 11:19   ASSESSMENT: 68 y.o. [place] woman status post left breast in  left axillary lymph node biopsy as well as right infraclavicular chest wall biopsy 10/23/2013 for a clinical T3, N2, M1, stage IV invasive ductal carcinoma, grade 2, estrogen receptor 100% positive, progesterone receptor 23% positive, with an MIB-1 of 61%, and no HER-2 amplification.  (1) staging studies 11/05/2013 showed metastatic involvement of bone, liver, lungs, and brain  (2) antiestrogen therapy with letrozole and fulvestrant as well as targeted therapy with palbociclib started 11/11/2013  (3) denosumab monthly started 11/11/2013  (4) Right hydronephrosis w/o obstruction-- followed by urology ([facility])    PLAN: Tametria is having an excellent reaction to her treatment, which she is tolerating well. The only anomaly is this slight increase in 2 of the liver lesions. I think this bears watching, so I am going to set her up for a liver MRI next week and that will serve as our new baseline. She requested Xanax pretreatment for that test. Overall the plan is to continue her letrozole, Palbociclib, fulvestrant and denosumab until there is evidence of disease progression.  She will have a brain MRI 2 weeks from now. She will discuss those results with Dr. Tammi Klippel. It is very encouraging that she has absolutely no CNS symptoms at this point.  We are having to drop the Palbociclib dose to 75. We are continuing to check the CBC on a weekly basis and when the neutrophil count rises back to 1.5 we'll resume the Palbociclib at that lower dose.  [name] has a good understanding of the overall plan. She agrees with that. She knows the goal of treatment in her case is control. She will call with any problems that may develop before next visit here.  Chauncey Cruel, MD   02/03/2014 4:56 PM

## 2014-02-04 ENCOUNTER — Other Ambulatory Visit: Payer: Self-pay | Admitting: *Deleted

## 2014-02-05 ENCOUNTER — Telehealth: Payer: Self-pay | Admitting: Oncology

## 2014-02-05 NOTE — Telephone Encounter (Signed)
cld & left pt message in re to appts-will mail copy of sch

## 2014-02-10 ENCOUNTER — Other Ambulatory Visit (HOSPITAL_BASED_OUTPATIENT_CLINIC_OR_DEPARTMENT_OTHER): Payer: Medicare Other

## 2014-02-10 DIAGNOSIS — C50419 Malignant neoplasm of upper-outer quadrant of unspecified female breast: Secondary | ICD-10-CM

## 2014-02-10 DIAGNOSIS — C50919 Malignant neoplasm of unspecified site of unspecified female breast: Secondary | ICD-10-CM

## 2014-02-10 LAB — CBC WITH DIFFERENTIAL/PLATELET
BASO%: 3.3 % — AB (ref 0.0–2.0)
Basophils Absolute: 0.1 10*3/uL (ref 0.0–0.1)
EOS%: 1.5 % (ref 0.0–7.0)
Eosinophils Absolute: 0.1 10*3/uL (ref 0.0–0.5)
HEMATOCRIT: 29.9 % — AB (ref 34.8–46.6)
HGB: 9.5 g/dL — ABNORMAL LOW (ref 11.6–15.9)
LYMPH%: 47.6 % (ref 14.0–49.7)
MCH: 32 pg (ref 25.1–34.0)
MCHC: 31.8 g/dL (ref 31.5–36.0)
MCV: 100.7 fL (ref 79.5–101.0)
MONO#: 0.7 10*3/uL (ref 0.1–0.9)
MONO%: 16.7 % — ABNORMAL HIGH (ref 0.0–14.0)
NEUT#: 1.2 10*3/uL — ABNORMAL LOW (ref 1.5–6.5)
NEUT%: 30.9 % — AB (ref 38.4–76.8)
Platelets: 337 10*3/uL (ref 145–400)
RBC: 2.97 10*6/uL — ABNORMAL LOW (ref 3.70–5.45)
RDW: 21.3 % — ABNORMAL HIGH (ref 11.2–14.5)
WBC: 3.9 10*3/uL (ref 3.9–10.3)
lymph#: 1.9 10*3/uL (ref 0.9–3.3)
nRBC: 0 % (ref 0–0)

## 2014-02-12 ENCOUNTER — Ambulatory Visit (HOSPITAL_COMMUNITY): Payer: Medicare Other

## 2014-02-17 ENCOUNTER — Other Ambulatory Visit (HOSPITAL_BASED_OUTPATIENT_CLINIC_OR_DEPARTMENT_OTHER): Payer: Medicare Other

## 2014-02-17 DIAGNOSIS — C50919 Malignant neoplasm of unspecified site of unspecified female breast: Secondary | ICD-10-CM

## 2014-02-17 DIAGNOSIS — C787 Secondary malignant neoplasm of liver and intrahepatic bile duct: Secondary | ICD-10-CM

## 2014-02-17 DIAGNOSIS — C50419 Malignant neoplasm of upper-outer quadrant of unspecified female breast: Secondary | ICD-10-CM

## 2014-02-17 LAB — CBC WITH DIFFERENTIAL/PLATELET
BASO%: 2.7 % — AB (ref 0.0–2.0)
Basophils Absolute: 0.1 10*3/uL (ref 0.0–0.1)
EOS%: 2.4 % (ref 0.0–7.0)
Eosinophils Absolute: 0.1 10*3/uL (ref 0.0–0.5)
HCT: 29.9 % — ABNORMAL LOW (ref 34.8–46.6)
HGB: 9.4 g/dL — ABNORMAL LOW (ref 11.6–15.9)
LYMPH%: 35.8 % (ref 14.0–49.7)
MCH: 31.9 pg (ref 25.1–34.0)
MCHC: 31.4 g/dL — AB (ref 31.5–36.0)
MCV: 101.4 fL — AB (ref 79.5–101.0)
MONO#: 0.5 10*3/uL (ref 0.1–0.9)
MONO%: 12.6 % (ref 0.0–14.0)
NEUT#: 1.9 10*3/uL (ref 1.5–6.5)
NEUT%: 46.5 % (ref 38.4–76.8)
NRBC: 0 % (ref 0–0)
Platelets: 332 10*3/uL (ref 145–400)
RBC: 2.95 10*6/uL — AB (ref 3.70–5.45)
RDW: 20.9 % — AB (ref 11.2–14.5)
WBC: 4.1 10*3/uL (ref 3.9–10.3)
lymph#: 1.5 10*3/uL (ref 0.9–3.3)

## 2014-02-18 ENCOUNTER — Other Ambulatory Visit: Payer: Self-pay | Admitting: Emergency Medicine

## 2014-02-18 ENCOUNTER — Ambulatory Visit (HOSPITAL_COMMUNITY)
Admission: RE | Admit: 2014-02-18 | Discharge: 2014-02-18 | Disposition: A | Discharge status: Home / Self Care | Payer: Medicare Other | Source: Ambulatory Visit | Attending: Oncology | Admitting: Oncology

## 2014-02-18 ENCOUNTER — Other Ambulatory Visit: Payer: Self-pay | Admitting: Oncology

## 2014-02-18 ENCOUNTER — Telehealth: Payer: Self-pay | Admitting: *Deleted

## 2014-02-18 DIAGNOSIS — C50919 Malignant neoplasm of unspecified site of unspecified female breast: Secondary | ICD-10-CM

## 2014-02-18 DIAGNOSIS — C50912 Malignant neoplasm of unspecified site of left female breast: Secondary | ICD-10-CM

## 2014-02-18 MED ORDER — GADOBENATE DIMEGLUMINE 529 MG/ML IV SOLN
15.0000 mL | Freq: Once | INTRAVENOUS | Status: AC | PRN
  Administered 2014-02-18: 11 mL via INTRAVENOUS

## 2014-02-18 MED ORDER — PALBOCICLIB 75 MG PO CAPS: 75.0000 mg | ORAL_CAPSULE | Freq: Every day | ORAL | Status: DC

## 2014-02-18 NOTE — Telephone Encounter (Signed)
Received a call yesterday from patient asking about her labs and if she can restart her Ibrance.  Per Dr. Jana Hakim patient can start her Ibrance dose of 75mg  and we will recheck labs on 02/24/14.  Per patient it is ok to leave a message with this information.  Left message for patient.

## 2014-02-19 ENCOUNTER — Ambulatory Visit
Admission: RE | Admit: 2014-02-19 | Discharge: 2014-02-19 | Disposition: A | Discharge status: Home / Self Care | Payer: Medicare Other | Source: Ambulatory Visit | Attending: Radiation Oncology | Admitting: Radiation Oncology

## 2014-02-19 DIAGNOSIS — C7949 Secondary malignant neoplasm of other parts of nervous system: Principal | ICD-10-CM

## 2014-02-19 DIAGNOSIS — C7931 Secondary malignant neoplasm of brain: Secondary | ICD-10-CM

## 2014-02-19 MED ORDER — GADOBENATE DIMEGLUMINE 529 MG/ML IV SOLN
10.0000 mL | Freq: Once | INTRAVENOUS | Status: AC | PRN
  Administered 2014-02-19: 10 mL via INTRAVENOUS

## 2014-02-22 ENCOUNTER — Ambulatory Visit
Admission: RE | Admit: 2014-02-22 | Discharge: 2014-02-22 | Disposition: A | Discharge status: Home / Self Care | Payer: Medicare Other | Source: Ambulatory Visit | Attending: Radiation Oncology | Admitting: Radiation Oncology

## 2014-02-22 ENCOUNTER — Other Ambulatory Visit: Payer: Self-pay | Admitting: Oncology

## 2014-02-22 ENCOUNTER — Encounter: Payer: Self-pay | Admitting: Radiation Oncology

## 2014-02-22 VITALS — BP 137/74 | HR 112 | Resp 16 | Wt 115.0 lb

## 2014-02-22 DIAGNOSIS — C50912 Malignant neoplasm of unspecified site of left female breast: Secondary | ICD-10-CM

## 2014-02-22 DIAGNOSIS — C7931 Secondary malignant neoplasm of brain: Principal | ICD-10-CM

## 2014-02-22 NOTE — Progress Notes (Signed)
Radiation Oncology         [phone]   Name: Vicki Marshall   Date: 02/22/2014   MRN: [id number]  DOB: [birth year]    Multidisciplinary Brain and Spine Oncology Clinic Follow-Up Visit Note  CC: Lilian Coma, MD  Lilian Coma, MD  Diagnosis:   68 yo woman with a 3 mm left cerebellar brain metastasis from ER positive HER-2 negative invasive ductal carcinoma of the upper outer quadrant of the left breast - stage IV   Interval Since Last Radiation:  N/A  Narrative:  The patient returns today for routine follow-up with myself and Dr. Kathyrn Sheriff from neurosurgery.  The recent films were presented in our multidisciplinary conference with neuroradiology just prior to the clinic.  She is without complaint.                              ALLERGIES:  has No Known Allergies.  Meds: Current Outpatient Prescriptions  Medication Sig Dispense Refill  . aspirin 81 MG EC tablet Take 81 mg by mouth daily. Swallow whole.      . calcium carbonate (TUMS - DOSED IN MG ELEMENTAL CALCIUM) 500 MG chewable tablet Chew 1 tablet by mouth daily.      Marland Kitchen denosumab (PROLIA) 60 MG/ML SOLN injection Inject 60 mg into the skin every 6 (six) months. Administer in upper arm, thigh, or abdomen      . FULVESTRANT IM Inject 500 mg into the muscle.      . letrozole (FEMARA) 2.5 MG tablet Take 1 tablet (2.5 mg total) by mouth daily.  90 tablet  4  . palbociclib (IBRANCE) 75 MG capsule Take 1 capsule (75 mg total) by mouth daily with breakfast. Take whole with food.  21 capsule  1  . ALPRAZolam (XANAX) 0.5 MG tablet Take 1 tablet (0.5 mg total) by mouth at bedtime as needed for anxiety.  10 tablet  0  . LORazepam (ATIVAN) 0.5 MG tablet        No current facility-administered medications for this encounter.    Physical Findings: The patient is in no acute distress. Patient is alert and oriented.  weight is 115 lb (52.164 kg). Her blood pressure is 137/74 and her pulse is 112. Her respiration is 16 and oxygen  saturation is 100%. .Neuro exam grossly intact.    No significant changes.  Lab Findings: Lab Results  Component Value Date   WBC 4.1 02/17/2014   HGB 9.4* 02/17/2014   HCT 29.9* 02/17/2014   MCV 101.4* 02/17/2014   PLT 332 02/17/2014    _0 @  Radiographic Findings: Mr Jeri Cos YC Contrast  02/19/2014   CLINICAL DATA:  S RS restaging. Metastatic breast cancer with left cerebellar lesion. Breast cancer on the left, in the upper outer quadrant.  EXAM: MRI HEAD WITHOUT AND WITH CONTRAST  TECHNIQUE: Multiplanar, multiecho pulse sequences of the brain and surrounding structures were obtained without and with intravenous contrast.  CONTRAST:  10m MULTIHANCE GADOBENATE DIMEGLUMINE 529 MG/ML IV SOLN  COMPARISON:  12/15/2013  FINDINGS: Diffusion imaging does not show any acute or subacute infarction. There are again seen to be mild chronic small-vessel ischemic changes of the cerebral hemispheric white matter. No cortical or large vessel territory infarction.  The previously seen focus of enhancement within the left cerebellum shows the same maximal diameter measurement of 4.2 mm, but is slightly thinner. There is a second 3 mm focus of enhancement posterior to  that which is newly seen. Both lesions show slight hyperintensity before contrast administration but do appear to enhance further. No other brain lesion. No hydrocephalus or extra-axial collection. Widespread osseous disease is noted affecting the calvarium and cervical spine.  IMPRESSION: Previously seen focus of enhancement in the left cerebellum is no larger, and may have decreased minimally in volume.  Second focus of enhancement now seen posterior to that measuring 3 mm in diameter.   Electronically Signed   By: Nelson Chimes M.D.   On: 02/19/2014 13:32   Mr Liver W Wo Contrast  02/18/2014   CLINICAL DATA:  Metastatic breast cancer.  EXAM: MRI ABDOMEN WITHOUT AND WITH CONTRAST  TECHNIQUE: Multiplanar multisequence MR imaging of the abdomen was  performed both before and after the administration of intravenous contrast.  CONTRAST:  65m MULTIHANCE GADOBENATE DIMEGLUMINE 529 MG/ML IV SOLN  COMPARISON:  PET-CT 02/02/2014  FINDINGS: As demonstrated on the PET scan there is diffuse hepatic metastatic disease. The lesions are throughout both lobes. The lesions are best demonstrated on the T2 weighted sequences as there is some motion artifact on the postcontrast images. The largest lesion in the left hepatic lobe on series 4, image number 23 measures 23.5 x 20 mm. This measured approximately 23.5 x 14.5 mm on the prior PET-CT. A right hepatic lobe lesion on series 4, image number 16 measures 19 x 16 mm and on the prior PET-CT D measured approximately 14 x 14 mm were lesions are demonstrated on the MRI.  The spleen is normal in size. No splenic lesions. The gallbladder is unremarkable. No common bile duct dilatation. The pancreas is unremarkable. 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  The stomach, duodenum, visualized small bowel and visualized colon are unremarkable. No mesenteric or retroperitoneal mass or adenopathy. Small scattered lymph nodes are noted.  Diffuse osseous metastatic disease is demonstrated. Basilar metastatic lung lesions are also noted. A left lower breast masses again identified.  IMPRESSION: Diffuse hepatic metastatic disease with slight interval enlargement of 2 index lesions as discussed above.  Diffuse osseous metastatic disease, basilar lung metastasis and left breast mass are again noted.  Scattered small mesenteric and retroperitoneal lymph nodes but no overt adenopathy.   Electronically Signed   By: MKalman JewelsM.D.   On: 02/18/2014 13:18   Nm Pet Image Restag (ps) Skull Base To Thigh  02/02/2014   CLINICAL DATA:  Subsequent treatment strategy for breast cancer with metastasis to bone. Metastasis to brain. Left sided hydronephrosis. Lung  metastasis.  EXAM: NUCLEAR MEDICINE PET SKULL BASE TO THIGH  TECHNIQUE: 5.8 mCi F-18 FDG was injected intravenously. Full-ring PET imaging was performed from the skull base to thigh after the radiotracer. CT data was obtained and used for attenuation correction and anatomic localization.  FASTING BLOOD GLUCOSE:  Value: 102 mg/dl  COMPARISON:  11/05/2013  FINDINGS: NECK  No areas of abnormal hypermetabolism.  CHEST  Hypermetabolic left axillary adenopathy. Nodes measure up to 9 mm and a S.U.V. max of 4.5 on image 67. On the prior exam, nodes measured up to 1.5 cm and a S.U.V. max of 9.8.  Left breast primary laterally measures 3.2 x 2.6 cm and a S.U.V. max of 9.0. Decreased in size from the prior exam, where it measured 4.2 x 3.9 cm and a S.U.V. max of 18.2. Adjacent skin thickening with hypermetabolism, suggesting a component of dermal involvement.  Near complete resolution of mediastinal and hilar  nodal hypermetabolism. A focus of left hilar hypermetabolism measures a S.U.V. max of 3.4 versus a S.U.V. max of 11.2 on the prior. Hypermetabolic pulmonary nodules have resolved.  ABDOMEN/PELVIS  Hepatic metastasis. An index right liver lobe lesion measures 1.2 cm and a S.U.V. max of 6.4 on image 109. Similar in size to on the prior exam, where it measured a S.U.V. max of 5.2.  The lesion just anterior to this in the right lobe of the liver measures 1.3 cm and a S.U.V. max of 5.3 on image 110. Not readily apparent on the prior CT, where it measured a S.U.V. max of 6.0.  Hypermetabolic lesion in the medial left liver lobe measures 1.6 cm and a S.U.V. max of 6.7 today. S.U.V. max of 4.7 on the prior exam.  SKELETON  Extensive osseous metastasis. An index hypermetabolic focus involving the left side of the sacrum measures a S.U.V. max of 4.9 versus a S.U.V. max of 9.0 on the prior. Similar decreased hypermetabolism throughout the thoracolumbar spine.  CT IMAGES PERFORMED FOR ATTENUATION CORRECTION  No cervical adenopathy.  Mild cardiomegaly. Decrease in pulmonary nodules. Right lower lobe 1.1 cm nodule on image 48 measures 1.4 cm on the prior. Right lower lobe 4 mm nodule on image 43 measures 7 mm on the prior  Similar moderate right-sided hydronephrosis with overlying cortical thinning. No definite cause identified. Pelvic floor laxity. Progressive left-sided posterior rib fractures, including a displaced fracture at the seventh.  IMPRESSION: 1. Response to therapy of left breast primary, axillary adenopathy, thoracic nodal metastasis, and pulmonary metastasis. 2. Hepatic metastasis which are felt to be minimally progressive. 3. Slight improvement in osseous metastasis. 4. Similar moderate right-sided hydronephrosis, without cause identified. Question chronic UPJ obstruction.   Electronically Signed   By: Abigail Miyamoto M.D.   On: 02/02/2014 11:19   Impression:  The patient has a new subcentimeter asymptomatic brain met with no change in previously noted lesion.  The development of a new metastasis may suggest that the brain metastases are not responding to systemic therapy.  She would be eligible for SRS to both lesions at this time with a high probability for local control and minimal risk.  Plan:  The patient will see Dr. Jana Hakim next week and discuss whether to proceed with [facility] or further surveillance at this time.  _____________________________________  Sheral Apley Tammi Klippel, M.D. and  Consuella Lose, M.D.

## 2014-02-22 NOTE — Progress Notes (Signed)
Denies headache, dizziness, nausea or vomiting. Denies diplopia or ringing in the ears. Denies pain. Weight and vitals stable. Steady gait noted. Scheduled to follow up with [facility] a week from Wednesday. Had a liver MRI Thursday and unsure of results. Here today to receive MRI of brain results from 02/19/14.

## 2014-02-24 ENCOUNTER — Other Ambulatory Visit (HOSPITAL_BASED_OUTPATIENT_CLINIC_OR_DEPARTMENT_OTHER): Payer: Medicare Other

## 2014-02-24 DIAGNOSIS — C50419 Malignant neoplasm of upper-outer quadrant of unspecified female breast: Secondary | ICD-10-CM

## 2014-02-24 DIAGNOSIS — C50919 Malignant neoplasm of unspecified site of unspecified female breast: Secondary | ICD-10-CM

## 2014-02-24 LAB — CBC WITH DIFFERENTIAL/PLATELET
BASO%: 1.8 % (ref 0.0–2.0)
Basophils Absolute: 0.1 10*3/uL (ref 0.0–0.1)
EOS%: 4.4 % (ref 0.0–7.0)
Eosinophils Absolute: 0.2 10*3/uL (ref 0.0–0.5)
HCT: 29.6 % — ABNORMAL LOW (ref 34.8–46.6)
HGB: 9.4 g/dL — ABNORMAL LOW (ref 11.6–15.9)
LYMPH%: 32.5 % (ref 14.0–49.7)
MCH: 32.1 pg (ref 25.1–34.0)
MCHC: 31.8 g/dL (ref 31.5–36.0)
MCV: 101 fL (ref 79.5–101.0)
MONO#: 0.3 10*3/uL (ref 0.1–0.9)
MONO%: 8.6 % (ref 0.0–14.0)
NEUT#: 2 10*3/uL (ref 1.5–6.5)
NEUT%: 52.7 % (ref 38.4–76.8)
Platelets: 275 10*3/uL (ref 145–400)
RBC: 2.93 10*6/uL — AB (ref 3.70–5.45)
RDW: 20 % — ABNORMAL HIGH (ref 11.2–14.5)
WBC: 3.9 10*3/uL (ref 3.9–10.3)
lymph#: 1.3 10*3/uL (ref 0.9–3.3)
nRBC: 0 % (ref 0–0)

## 2014-03-03 ENCOUNTER — Ambulatory Visit (HOSPITAL_BASED_OUTPATIENT_CLINIC_OR_DEPARTMENT_OTHER): Payer: Medicare Other

## 2014-03-03 ENCOUNTER — Encounter: Payer: Self-pay | Admitting: Nurse Practitioner

## 2014-03-03 ENCOUNTER — Other Ambulatory Visit (HOSPITAL_BASED_OUTPATIENT_CLINIC_OR_DEPARTMENT_OTHER): Payer: Medicare Other

## 2014-03-03 ENCOUNTER — Ambulatory Visit (HOSPITAL_BASED_OUTPATIENT_CLINIC_OR_DEPARTMENT_OTHER): Payer: Medicare Other | Admitting: Nurse Practitioner

## 2014-03-03 ENCOUNTER — Telehealth: Payer: Self-pay | Admitting: Nurse Practitioner

## 2014-03-03 VITALS — BP 143/68 | HR 90 | Temp 98.3°F | Resp 18 | Ht 66.0 in | Wt 115.0 lb

## 2014-03-03 DIAGNOSIS — Z23 Encounter for immunization: Secondary | ICD-10-CM

## 2014-03-03 DIAGNOSIS — C50419 Malignant neoplasm of upper-outer quadrant of unspecified female breast: Secondary | ICD-10-CM

## 2014-03-03 DIAGNOSIS — C7952 Secondary malignant neoplasm of bone marrow: Secondary | ICD-10-CM

## 2014-03-03 DIAGNOSIS — C7951 Secondary malignant neoplasm of bone: Secondary | ICD-10-CM

## 2014-03-03 DIAGNOSIS — C50412 Malignant neoplasm of upper-outer quadrant of left female breast: Secondary | ICD-10-CM

## 2014-03-03 DIAGNOSIS — C7800 Secondary malignant neoplasm of unspecified lung: Secondary | ICD-10-CM

## 2014-03-03 DIAGNOSIS — C7931 Secondary malignant neoplasm of brain: Secondary | ICD-10-CM

## 2014-03-03 DIAGNOSIS — C50912 Malignant neoplasm of unspecified site of left female breast: Secondary | ICD-10-CM

## 2014-03-03 DIAGNOSIS — C7949 Secondary malignant neoplasm of other parts of nervous system: Secondary | ICD-10-CM

## 2014-03-03 DIAGNOSIS — Z5111 Encounter for antineoplastic chemotherapy: Secondary | ICD-10-CM

## 2014-03-03 DIAGNOSIS — C787 Secondary malignant neoplasm of liver and intrahepatic bile duct: Secondary | ICD-10-CM

## 2014-03-03 DIAGNOSIS — Z170 Estrogen receptor positive status [ER+]: Secondary | ICD-10-CM

## 2014-03-03 DIAGNOSIS — C50919 Malignant neoplasm of unspecified site of unspecified female breast: Secondary | ICD-10-CM

## 2014-03-03 LAB — CBC WITH DIFFERENTIAL/PLATELET
BASO%: 1.4 % (ref 0.0–2.0)
Basophils Absolute: 0 10*3/uL (ref 0.0–0.1)
EOS ABS: 0.1 10*3/uL (ref 0.0–0.5)
EOS%: 3.9 % (ref 0.0–7.0)
HEMATOCRIT: 32 % — AB (ref 34.8–46.6)
HGB: 10 g/dL — ABNORMAL LOW (ref 11.6–15.9)
LYMPH%: 36.3 % (ref 14.0–49.7)
MCH: 32.1 pg (ref 25.1–34.0)
MCHC: 31.3 g/dL — ABNORMAL LOW (ref 31.5–36.0)
MCV: 102.6 fL — AB (ref 79.5–101.0)
MONO#: 0.2 10*3/uL (ref 0.1–0.9)
MONO%: 7.5 % (ref 0.0–14.0)
NEUT#: 1.4 10*3/uL — ABNORMAL LOW (ref 1.5–6.5)
NEUT%: 50.9 % (ref 38.4–76.8)
PLATELETS: 275 10*3/uL (ref 145–400)
RBC: 3.12 10*6/uL — AB (ref 3.70–5.45)
RDW: 19.7 % — ABNORMAL HIGH (ref 11.2–14.5)
WBC: 2.8 10*3/uL — AB (ref 3.9–10.3)
lymph#: 1 10*3/uL (ref 0.9–3.3)
nRBC: 0 % (ref 0–0)

## 2014-03-03 MED ORDER — FULVESTRANT 250 MG/5ML IM SOLN
500.0000 mg | Freq: Once | INTRAMUSCULAR | Status: AC
  Administered 2014-03-03: 500 mg via INTRAMUSCULAR
  Filled 2014-03-03: qty 10

## 2014-03-03 MED ORDER — DENOSUMAB 120 MG/1.7ML ~~LOC~~ SOLN
120.0000 mg | Freq: Once | SUBCUTANEOUS | Status: AC
  Administered 2014-03-03: 120 mg via SUBCUTANEOUS
  Filled 2014-03-03: qty 1.7

## 2014-03-03 MED ORDER — INFLUENZA VAC SPLIT QUAD 0.5 ML IM SUSY
0.5000 mL | PREFILLED_SYRINGE | Freq: Once | INTRAMUSCULAR | Status: AC
  Administered 2014-03-03: 0.5 mL via INTRAMUSCULAR
  Filled 2014-03-03: qty 0.5

## 2014-03-03 NOTE — Telephone Encounter (Signed)
per pof to sch pt appt-gave pt copy of sch-sent Kathrine Cords email to override for appt on 10/16 for 60 per HF @ 3:15

## 2014-03-03 NOTE — Telephone Encounter (Signed)
per pof to sch lab-per HF pt has lab 10/14 that is sufficient

## 2014-03-03 NOTE — Progress Notes (Addendum)
[place]  Telephone:(336) [phone] Fax:(336) [phone]     ID: Vicki Marshall OB: [birth year]  MR#: [id number]  ZHY#:[id number]  PCP: Vicki Coma, MD GYN:   SU: Vicki Marshall OTHER MD: Vicki Marshall, Vicki Marshall, Vicki Marshall DDS, Vicki Marshall, Vicki Marshall  CHIEF COMPLAINT: stage IV breast cancer, estrogen receptor positive  TREATMENT: Letrozole, fulvestrant, Palbociclib, and denosumab  BREAST CANCER HISTORY: From the original intake note 11/04/2013:  Vicki Marshall noted a change in her left breast approximately mid 2014. This was a flattening of the lateral aspect of the left breast. Eventually she noted an indentation and eventually a "sore" in that area. She had not shared this information with her husband, and she did not have a primary care physician. More recently, as she started to have symptoms related to this, she mentioned it to her husband Vicki Marshall and she establish yourself with Dr. Stephanie Marshall, who set her up for bilateral diagnostic mammography and ultrasonography at the [facility] 10/23/2013.. This showed a breast density to be category C. The right breast was negative.  The left breast contained an ulcerated mass laterally measuring at least 4 cm mammography. On physical exam there was an ulcerated mass in the 2:00 location of the left breast, associated with erythematous skin nodules in the upper inner quadrant. The left breast was smaller than the right, and firm. There were palpable left axillary lymph nodes. In addition, on the right upper chest wall, infraclavicular like, there was a firm nodule separate from the bony structures. There was also a right preauricular node and a firm left submandibular node.  Ultrasound showed multiple nodules throughout the lower portion of the left breast. There was a large mass in the upper outer quadrant of the left breast contiguous with the skin ulceration, measuring greater than 5 cm. The left axilla showed  at least 3 suspicious masses, the largest measuring 1.9 cm. Ultrasound of the palpable abnormality of the right chest wall showed a superficial irregular mass, subdermal, measuring 0.8 cm.  Biopsies of the left breast mass, a left axillary lymph node, and the right infra-clavicular mass on 10/23/2013, all showed ([id number]) and invasive ductal carcinoma, grade 1 or 2, estrogen receptor 100% positive, progesterone receptor 23% positive, both with strong staining intensity, with an MIB-1 of 61%, and no HER-2 amplification, the signals ratio being 0.90, and the number per cell 1.80.  On 11/02/2013 the patient underwent bilateral breast MRI and this showed, in the left breast, a 5.8 cm mass eroding through the overlying skin, and also invading the underlying pectoralis muscle and chest wall. There were multiple enhancing nodules throughout the left breast and an adjacent 4.8 cm irregular enhancing mass inferiorly. There are multiple large irregular left axillary lymph nodes, the largest measuring 2.4 cm. There was a 0.9 cm left subpectoral lymph node as well as prevascular and precarinal lymph nodes.  In the right breast far upper inner quadrant there was a 0.8 cm irregular enhancing mass abutting the overlying skin. This is the mass that was biopsied and shown to be positive. In addition there was patchy heterogeneous enhancement of the sternum.  The patient's subsequent history is as detailed below  INTERVAL HISTORY: Vicki Marshall returns today for follow up of her breast cancer accompanied by her husband, Vicki Marshall. Since her last visit she has had brain and liver MRI, which showed a new lesion in the brain, and a minimal increase to the lesions in her liver. She is tolerating her treatments of letrozole, fulvestrant,  palbociclib, and denosumab very well. We continue to watch for neutropenia weekly from the palbociclib. Today is day 12 of her 21 day cycle of this drug.   REVIEW OF SYSTEMS: Vicki Marshall denies pain,  fevers, chills, nausea, vomiting, or changes in bowel or bladder habits. She denies hot flashes or vaginal dryness. She is occassionally fatigued, but is still as active as possible. Her appetite has remained the same, and she drinks one "ensure Plus" per day before bed. She has not shortness of breath, chest pain, palpations, or cough. Her hair is thinning, but she still has a fair amount to style. She tolerated the fulvestrant well, besides discomfort with the local injection site. A detailed review of systems is otherwise noncontributory.   PAST MEDICAL HISTORY: Past Medical History  Diagnosis Date  . Urinary tract infection   . Anemia   . Breast cancer     T3N2M1 stage IV invasive ductal carcinoma   . Brain metastases   . Hydronephrosis, left   . Lung metastases   . Bone metastases     PAST SURGICAL HISTORY: Past Surgical History  Procedure Laterality Date  . Cataract extraction  2013  . Mouth surgery  1981    FAMILY HISTORY: The patient's father died at the age of 40, from pneumonia. The patient's mother lived to be 58. The patient had no brothers, one sister. There is no history of breast or ovarian cancer in the family  GYNECOLOGIC HISTORY:  Menarche age 82. The patient is GX P0. She stopped having periods approximately 2003. She did not take hormone replacement. She used birth control pills for approximately 8 years remotely, with no complications   SOCIAL HISTORY:  Vicki Marshall retired about a year ago. 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 Her husband Vicki Marshall is retired from working in Charity fundraiser. They live alone, with no pets.    ADVANCED DIRECTIVES: Not in place   HEALTH MAINTENANCE: History  Substance Use Topics  . Smoking status: Never Smoker   . Smokeless tobacco: Never Used  . Alcohol Use: Yes     Comment: 10 12 glasses/week     Colonoscopy: Never  PAP: 1990  Bone density: Never  Lipid panel:  No Known Allergies  Current  Outpatient Prescriptions  Medication Sig Dispense Refill  . aspirin 81 MG EC tablet Take 81 mg by mouth daily. Swallow whole.      . calcium carbonate (TUMS - DOSED IN MG ELEMENTAL CALCIUM) 500 MG chewable tablet Chew 1 tablet by mouth daily.      Marland Kitchen denosumab (PROLIA) 60 MG/ML SOLN injection Inject 60 mg into the skin every 6 (six) months. Administer in upper arm, thigh, or abdomen      . FULVESTRANT IM Inject 500 mg into the muscle.      . letrozole (FEMARA) 2.5 MG tablet Take 1 tablet (2.5 mg total) by mouth daily.  90 tablet  4  . palbociclib (IBRANCE) 75 MG capsule Take 1 capsule (75 mg total) by mouth daily with breakfast. Take whole with food.  21 capsule  1  . ALPRAZolam (XANAX) 0.5 MG tablet Take 1 tablet (0.5 mg total) by mouth at bedtime as needed for anxiety.  10 tablet  0  . LORazepam (ATIVAN) 0.5 MG tablet        Current Facility-Administered Medications  Medication Dose Route Frequency Provider Last Rate Last Dose  . Influenza vac split quadrivalent PF (FLUARIX) injection 0.5 mL  0.5 mL Intramuscular Once  Marcelino Duster, NP        OBJECTIVE: Middle-aged white woman who appears her stated age 34 Vitals:   03/03/14 1004  BP: 143/68  Pulse: 90  Temp: 98.3 F (36.8 C)  Resp: 18     Body mass index is 18.57 kg/(m^2).    ECOG FS:1 - Symptomatic but completely ambulatory  Skin: warm, dry  HEENT: sclerae anicteric, conjunctivae pink, oropharynx clear. No thrush or mucositis.  Lymph Nodes: No cervical or supraclavicular lymphadenopathy  Lungs: clear to auscultation bilaterally, no rales, wheezes, or rhonci  Heart: regular rate and rhythm  Abdomen: round, soft, non tender, positive bowel sounds  Musculoskeletal: No focal spinal tenderness, no peripheral edema  Neuro: non focal, well oriented, positive affect  Breasts: deferred  Left breast photo from 01/06/2014   LAB RESULTS:  CMP     Component Value Date/Time   NA 139 02/03/2014 1505   K 4.3 02/03/2014 1505   CO2  24 02/03/2014 1505   GLUCOSE 104 02/03/2014 1505   BUN 22.0 02/03/2014 1505   CREATININE 1.2* 02/03/2014 1505   CALCIUM 9.6 02/03/2014 1505   PROT 7.2 02/03/2014 1505   ALBUMIN 3.4* 02/03/2014 1505   AST 49* 02/03/2014 1505   ALT 30 02/03/2014 1505   ALKPHOS 176* 02/03/2014 1505   BILITOT 0.33 02/03/2014 1505    I No results found for this basename: SPEP,  UPEP,   kappa and lambda light chains    Lab Results  Component Value Date   WBC 2.8* 03/03/2014   NEUTROABS 1.4* 03/03/2014   HGB 10.0* 03/03/2014   HCT 32.0* 03/03/2014   MCV 102.6* 03/03/2014   PLT 275 03/03/2014      Chemistry      Component Value Date/Time   NA 139 02/03/2014 1505   K 4.3 02/03/2014 1505   CO2 24 02/03/2014 1505   BUN 22.0 02/03/2014 1505   CREATININE 1.2* 02/03/2014 1505      Component Value Date/Time   CALCIUM 9.6 02/03/2014 1505   ALKPHOS 176* 02/03/2014 1505   AST 49* 02/03/2014 1505   ALT 30 02/03/2014 1505   BILITOT 0.33 02/03/2014 1505       No results found for this basename: LABCA2    No components found with this basename: LABCA125    No results found for this basename: INR,  in the last 168 hours  Urinalysis No results found for this basename: colorurine,  appearanceur,  labspec,  phurine,  glucoseu,  hgbur,  bilirubinur,  ketonesur,  proteinur,  urobilinogen,  nitrite,  leukocytesur    STUDIES: Mr Kizzie Fantasia Contrast  02-24-2014   CLINICAL DATA:  S RS restaging. Metastatic breast cancer with left cerebellar lesion. Breast cancer on the left, in the upper outer quadrant.  EXAM: MRI HEAD WITHOUT AND WITH CONTRAST  TECHNIQUE: Multiplanar, multiecho pulse sequences of the brain and surrounding structures were obtained without and with intravenous contrast.  CONTRAST:  41m MULTIHANCE GADOBENATE DIMEGLUMINE 529 MG/ML IV SOLN  COMPARISON:  12/15/2013  FINDINGS: Diffusion imaging does not show any acute or subacute infarction. There are again seen to be mild chronic small-vessel ischemic changes of the cerebral hemispheric  white matter. No cortical or large vessel territory infarction.  The previously seen focus of enhancement within the left cerebellum shows the same maximal diameter measurement of 4.2 mm, but is slightly thinner. There is a second 3 mm focus of enhancement posterior to that which is newly seen. Both lesions show slight hyperintensity  before contrast administration but do appear to enhance further. No other brain lesion. No hydrocephalus or extra-axial collection. Widespread osseous disease is noted affecting the calvarium and cervical spine.  IMPRESSION: Previously seen focus of enhancement in the left cerebellum is no larger, and may have decreased minimally in volume.  Second focus of enhancement now seen posterior to that measuring 3 mm in diameter.   Electronically Signed   By: Nelson Chimes M.D.   On: 02/19/2014 13:32   Mr Liver W Wo Contrast  02/18/2014   CLINICAL DATA:  Metastatic breast cancer.  EXAM: MRI ABDOMEN WITHOUT AND WITH CONTRAST  TECHNIQUE: Multiplanar multisequence MR imaging of the abdomen was performed both before and after the administration of intravenous contrast.  CONTRAST:  26m MULTIHANCE GADOBENATE DIMEGLUMINE 529 MG/ML IV SOLN  COMPARISON:  PET-CT 02/02/2014  FINDINGS: As demonstrated on the PET scan there is diffuse hepatic metastatic disease. The lesions are throughout both lobes. The lesions are best demonstrated on the T2 weighted sequences as there is some motion artifact on the postcontrast images. The largest lesion in the left hepatic lobe on series 4, image number 23 measures 23.5 x 20 mm. This measured approximately 23.5 x 14.5 mm on the prior PET-CT. A right hepatic lobe lesion on series 4, image number 16 measures 19 x 16 mm and on the prior PET-CT D measured approximately 14 x 14 mm were lesions are demonstrated on the MRI.  The spleen is normal in size. No splenic lesions. The gallbladder is unremarkable. No common bile duct dilatation. The pancreas is unremarkable. 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  The stomach, duodenum, visualized small bowel and visualized colon are unremarkable. No mesenteric or retroperitoneal mass or adenopathy. Small scattered lymph nodes are noted.  Diffuse osseous metastatic disease is demonstrated. Basilar metastatic lung lesions are also noted. A left lower breast masses again identified.  IMPRESSION: Diffuse hepatic metastatic disease with slight interval enlargement of 2 index lesions as discussed above.  Diffuse osseous metastatic disease, basilar lung metastasis and left breast mass are again noted.  Scattered small mesenteric and retroperitoneal lymph nodes but no overt adenopathy.   Electronically Signed   By: MKalman JewelsM.D.   On: 02/18/2014 13:18   Nm Pet Image Restag (ps) Skull Base To Thigh  02/02/2014   CLINICAL DATA:  Subsequent treatment strategy for breast cancer with metastasis to bone. Metastasis to brain. Left sided hydronephrosis. Lung metastasis.  EXAM: NUCLEAR MEDICINE PET SKULL BASE TO THIGH  TECHNIQUE: 5.8 mCi F-18 FDG was injected intravenously. Full-ring PET imaging was performed from the skull base to thigh after the radiotracer. CT data was obtained and used for attenuation correction and anatomic localization.  FASTING BLOOD GLUCOSE:  Value: 102 mg/dl  COMPARISON:  11/05/2013  FINDINGS: NECK  No areas of abnormal hypermetabolism.  CHEST  Hypermetabolic left axillary adenopathy. Nodes measure up to 9 mm and a S.U.V. max of 4.5 on image 67. On the prior exam, nodes measured up to 1.5 cm and a S.U.V. max of 9.8.  Left breast primary laterally measures 3.2 x 2.6 cm and a S.U.V. max of 9.0. Decreased in size from the prior exam, where it measured 4.2 x 3.9 cm and a S.U.V. max of 18.2. Adjacent skin thickening with hypermetabolism, suggesting a component of dermal involvement.  Near complete resolution of mediastinal and hilar nodal  hypermetabolism. A focus of left hilar hypermetabolism measures a  S.U.V. max of 3.4 versus a S.U.V. max of 11.2 on the prior. Hypermetabolic pulmonary nodules have resolved.  ABDOMEN/PELVIS  Hepatic metastasis. An index right liver lobe lesion measures 1.2 cm and a S.U.V. max of 6.4 on image 109. Similar in size to on the prior exam, where it measured a S.U.V. max of 5.2.  The lesion just anterior to this in the right lobe of the liver measures 1.3 cm and a S.U.V. max of 5.3 on image 110. Not readily apparent on the prior CT, where it measured a S.U.V. max of 6.0.  Hypermetabolic lesion in the medial left liver lobe measures 1.6 cm and a S.U.V. max of 6.7 today. S.U.V. max of 4.7 on the prior exam.  SKELETON  Extensive osseous metastasis. An index hypermetabolic focus involving the left side of the sacrum measures a S.U.V. max of 4.9 versus a S.U.V. max of 9.0 on the prior. Similar decreased hypermetabolism throughout the thoracolumbar spine.  CT IMAGES PERFORMED FOR ATTENUATION CORRECTION  No cervical adenopathy. Mild cardiomegaly. Decrease in pulmonary nodules. Right lower lobe 1.1 cm nodule on image 48 measures 1.4 cm on the prior. Right lower lobe 4 mm nodule on image 43 measures 7 mm on the prior  Similar moderate right-sided hydronephrosis with overlying cortical thinning. No definite cause identified. Pelvic floor laxity. Progressive left-sided posterior rib fractures, including a displaced fracture at the seventh.  IMPRESSION: 1. Response to therapy of left breast primary, axillary adenopathy, thoracic nodal metastasis, and pulmonary metastasis. 2. Hepatic metastasis which are felt to be minimally progressive. 3. Slight improvement in osseous metastasis. 4. Similar moderate right-sided hydronephrosis, without cause identified. Question chronic UPJ obstruction.   Electronically Signed   By: Abigail Miyamoto M.D.   On: 02/02/2014 11:19   ASSESSMENT: 68 y.o. Vicki Marshall woman status post left breast in left  axillary lymph node biopsy as well as right infraclavicular chest wall biopsy 10/23/2013 for a clinical T3, N2, M1, stage IV invasive ductal carcinoma, grade 2, estrogen receptor 100% positive, progesterone receptor 23% positive, with an MIB-1 of 61%, and no HER-2 amplification.  (1) staging studies 11/05/2013 showed metastatic involvement of bone, liver, lungs, and brain  (2) antiestrogen therapy with letrozole and fulvestrant as well as targeted therapy with palbociclib started 11/11/2013  (3) denosumab monthly started 11/11/2013  (4) Right hydronephrosis w/o obstruction-- followed by urology ([facility])  PLAN: Mariem continues to tolerate her treatments well. The labs were reviewed and her [place] is down to 1.4, but we will continue to monitor this level with weekly labs. We will continue the letrozole, palbociclib (38m daily), fulvestrant, and denosumab until there is evidence of disease progression. If her ANittanycontinues to drop, we will drop her palbociclib dose to 740mevery other day.   We discussed with Dr. MaJana Hakimbout the new lesion in her brain. He agrees with Dr. MaTammi Klippelo have stereotactic radiation to this area as well. She currently is displaying no CNS symptoms.   CaNatalyaill return in 2 weeks, before the start of her next cycle of palbociclib. In the meantime she will follow up with Dr. MaJohny Shearsffice about beginning radiation. She understands and agrees with this plan. She knows the goal of treatment in her case is control. She has been encouraged to call with any issues that might arise before her next visit here.   FeMarcelino DusterNP   03/03/2014 10:51 AM   ADDENDUM: CaKeontas apprehensive at receiving stereotactic radiotherapy. However I do think it is her  best that. While antiestrogen is "cross the blood-brain barrier" in the sense that they make estrogen unavailable to cancer cells anywhere in the body, and that is the effect of letrozole, fulvestrant is not  expected to cross the blood-brain barrier and therefore would not affect the estrogen receptors of any breast cancer cells in that site. It is encouraging that if earlier lesion in the brain has not changed, but with a new lesion I think both lesions minus will be treated and that is the plan of Dr. Tammi Klippel, with which I concur. [name] was reassured after this discussion.  As far as her systemic disease is concerned we do not have clear documentation of progression (that would require 20% increase in any lesion) so we are continuing with letrozole, fulvestrant, and Palbociclib as before.  It may be that her current dose of Palbociclib is still on the high side. If so we will decrease it to 75 mg every other day.  I personally saw this patient and performed a substantive portion of this encounter with the listed APP documented above.   Chauncey Cruel, MD

## 2014-03-10 ENCOUNTER — Other Ambulatory Visit (HOSPITAL_BASED_OUTPATIENT_CLINIC_OR_DEPARTMENT_OTHER): Payer: Medicare Other

## 2014-03-10 DIAGNOSIS — C50912 Malignant neoplasm of unspecified site of left female breast: Secondary | ICD-10-CM

## 2014-03-10 DIAGNOSIS — C7931 Secondary malignant neoplasm of brain: Secondary | ICD-10-CM

## 2014-03-10 DIAGNOSIS — C50919 Malignant neoplasm of unspecified site of unspecified female breast: Secondary | ICD-10-CM

## 2014-03-10 DIAGNOSIS — C7951 Secondary malignant neoplasm of bone: Secondary | ICD-10-CM

## 2014-03-10 LAB — COMPREHENSIVE METABOLIC PANEL (CC13)
ALT: 36 U/L (ref 0–55)
AST: 58 U/L — ABNORMAL HIGH (ref 5–34)
Albumin: 3.4 g/dL — ABNORMAL LOW (ref 3.5–5.0)
Alkaline Phosphatase: 175 U/L — ABNORMAL HIGH (ref 40–150)
Anion Gap: 8 mEq/L (ref 3–11)
BUN: 30.1 mg/dL — AB (ref 7.0–26.0)
CALCIUM: 8.8 mg/dL (ref 8.4–10.4)
CHLORIDE: 105 meq/L (ref 98–109)
CO2: 26 mEq/L (ref 22–29)
Creatinine: 1.8 mg/dL — ABNORMAL HIGH (ref 0.6–1.1)
Glucose: 113 mg/dl (ref 70–140)
Potassium: 4.3 mEq/L (ref 3.5–5.1)
Sodium: 139 mEq/L (ref 136–145)
Total Bilirubin: 0.49 mg/dL (ref 0.20–1.20)
Total Protein: 7.4 g/dL (ref 6.4–8.3)

## 2014-03-10 LAB — CBC WITH DIFFERENTIAL/PLATELET
BASO%: 1.9 % (ref 0.0–2.0)
BASOS ABS: 0 10*3/uL (ref 0.0–0.1)
EOS%: 2.2 % (ref 0.0–7.0)
Eosinophils Absolute: 0 10*3/uL (ref 0.0–0.5)
HEMATOCRIT: 29.8 % — AB (ref 34.8–46.6)
HGB: 9.6 g/dL — ABNORMAL LOW (ref 11.6–15.9)
LYMPH%: 46.8 % (ref 14.0–49.7)
MCH: 32.7 pg (ref 25.1–34.0)
MCHC: 32.1 g/dL (ref 31.5–36.0)
MCV: 101.9 fL — AB (ref 79.5–101.0)
MONO#: 0.2 10*3/uL (ref 0.1–0.9)
MONO%: 10.7 % (ref 0.0–14.0)
NEUT#: 0.8 10*3/uL — ABNORMAL LOW (ref 1.5–6.5)
NEUT%: 38.4 % (ref 38.4–76.8)
Platelets: 211 10*3/uL (ref 145–400)
RBC: 2.92 10*6/uL — ABNORMAL LOW (ref 3.70–5.45)
RDW: 22.1 % — ABNORMAL HIGH (ref 11.2–14.5)
WBC: 2 10*3/uL — ABNORMAL LOW (ref 3.9–10.3)
lymph#: 0.9 10*3/uL (ref 0.9–3.3)

## 2014-03-14 ENCOUNTER — Encounter: Payer: Self-pay | Admitting: Radiation Oncology

## 2014-03-14 NOTE — Progress Notes (Signed)
  Radiation Oncology         [phone] ________________________________   Name: Vicki Marshall MRN: [id number]  Date: 03/15/2014  DOB: 1945-06-16  SIMULATION AND TREATMENT PLANNING NOTE  DIAGNOSIS:  68 yo woman with two distinct 3 and 4 mm left cerebellar brain metastases from ER positive HER-2 negative invasive ductal carcinoma of the upper outer quadrant of the left breast - stage IV   NARRATIVE:  The patient was brought to the [facility].  Identity was confirmed.  All relevant records and images related to the planned course of therapy were reviewed.  The patient freely provided informed written consent to proceed with treatment after reviewing the details related to the planned course of therapy. The consent form was witnessed and verified by the simulation staff. Intravenous access was established for contrast administration. Then, the patient was set-up in a stable reproducible supine position for radiation therapy.  A relocatable thermoplastic stereotactic head frame was fabricated for precise immobilization.  CT images were obtained.  Surface markings were placed.  The CT images were loaded into the planning software and fused with the patient's targeting MRI scan.  Then the target and avoidance structures were contoured.  Treatment planning then occurred.  The radiation prescription was entered and confirmed.  I have requested 3D planning  I have requested a DVH of the following structures: Brain stem, brain, left eye, right eye, lenses, optic chiasm, target volumes, uninvolved brain, and normal tissue.    PLAN:  The patient will receive 20 Gy in 1 fraction to both metastases.  ________________________________  Sheral Apley Tammi Klippel, M.D.

## 2014-03-15 ENCOUNTER — Ambulatory Visit
Admission: RE | Admit: 2014-03-15 | Discharge: 2014-03-15 | Disposition: A | Discharge status: Home / Self Care | Payer: Medicare Other | Source: Ambulatory Visit | Attending: Radiation Oncology | Admitting: Radiation Oncology

## 2014-03-15 ENCOUNTER — Ambulatory Visit: Admission: RE | Admit: 2014-03-15 | Payer: Medicare Other | Source: Ambulatory Visit

## 2014-03-15 DIAGNOSIS — C7931 Secondary malignant neoplasm of brain: Secondary | ICD-10-CM | POA: Insufficient documentation

## 2014-03-15 DIAGNOSIS — Z170 Estrogen receptor positive status [ER+]: Secondary | ICD-10-CM | POA: Insufficient documentation

## 2014-03-15 DIAGNOSIS — C50912 Malignant neoplasm of unspecified site of left female breast: Secondary | ICD-10-CM

## 2014-03-15 DIAGNOSIS — Z510 Encounter for antineoplastic radiation therapy: Secondary | ICD-10-CM | POA: Diagnosis present

## 2014-03-15 DIAGNOSIS — C50412 Malignant neoplasm of upper-outer quadrant of left female breast: Secondary | ICD-10-CM | POA: Insufficient documentation

## 2014-03-16 ENCOUNTER — Telehealth: Payer: Self-pay | Admitting: *Deleted

## 2014-03-16 NOTE — Telephone Encounter (Signed)
Vicki Marshall called stating that the pts appt has been moved w/ [name] and she has radiation treatment already scheduled and the two appts are interfering with each other. Moved appt up to 2:45 so she can do both appts same day.

## 2014-03-17 ENCOUNTER — Other Ambulatory Visit (HOSPITAL_BASED_OUTPATIENT_CLINIC_OR_DEPARTMENT_OTHER): Payer: Medicare Other

## 2014-03-17 DIAGNOSIS — C50912 Malignant neoplasm of unspecified site of left female breast: Secondary | ICD-10-CM

## 2014-03-17 DIAGNOSIS — C7800 Secondary malignant neoplasm of unspecified lung: Secondary | ICD-10-CM

## 2014-03-17 DIAGNOSIS — C7951 Secondary malignant neoplasm of bone: Secondary | ICD-10-CM

## 2014-03-17 DIAGNOSIS — C50412 Malignant neoplasm of upper-outer quadrant of left female breast: Secondary | ICD-10-CM

## 2014-03-17 DIAGNOSIS — C787 Secondary malignant neoplasm of liver and intrahepatic bile duct: Secondary | ICD-10-CM

## 2014-03-17 DIAGNOSIS — C50919 Malignant neoplasm of unspecified site of unspecified female breast: Secondary | ICD-10-CM

## 2014-03-17 DIAGNOSIS — C7931 Secondary malignant neoplasm of brain: Secondary | ICD-10-CM

## 2014-03-17 LAB — CBC WITH DIFFERENTIAL/PLATELET
BASO%: 2.5 % — ABNORMAL HIGH (ref 0.0–2.0)
BASOS ABS: 0.1 10*3/uL (ref 0.0–0.1)
EOS ABS: 0 10*3/uL (ref 0.0–0.5)
EOS%: 1.2 % (ref 0.0–7.0)
HCT: 28.6 % — ABNORMAL LOW (ref 34.8–46.6)
HEMOGLOBIN: 9.2 g/dL — AB (ref 11.6–15.9)
LYMPH#: 1 10*3/uL (ref 0.9–3.3)
LYMPH%: 44.5 % (ref 14.0–49.7)
MCH: 33.5 pg (ref 25.1–34.0)
MCHC: 32.2 g/dL (ref 31.5–36.0)
MCV: 103.8 fL — AB (ref 79.5–101.0)
MONO#: 0.4 10*3/uL (ref 0.1–0.9)
MONO%: 16.8 % — AB (ref 0.0–14.0)
NEUT%: 35 % — AB (ref 38.4–76.8)
NEUTROS ABS: 0.8 10*3/uL — AB (ref 1.5–6.5)
Platelets: 182 10*3/uL (ref 145–400)
RBC: 2.76 10*6/uL — ABNORMAL LOW (ref 3.70–5.45)
RDW: 21.7 % — AB (ref 11.2–14.5)
WBC: 2.2 10*3/uL — ABNORMAL LOW (ref 3.9–10.3)

## 2014-03-17 LAB — COMPREHENSIVE METABOLIC PANEL (CC13)
ALT: 33 U/L (ref 0–55)
AST: 58 U/L — ABNORMAL HIGH (ref 5–34)
Albumin: 3.4 g/dL — ABNORMAL LOW (ref 3.5–5.0)
Alkaline Phosphatase: 170 U/L — ABNORMAL HIGH (ref 40–150)
Anion Gap: 9 mEq/L (ref 3–11)
BUN: 27.6 mg/dL — AB (ref 7.0–26.0)
CALCIUM: 9.4 mg/dL (ref 8.4–10.4)
CHLORIDE: 108 meq/L (ref 98–109)
CO2: 23 mEq/L (ref 22–29)
Creatinine: 1.2 mg/dL — ABNORMAL HIGH (ref 0.6–1.1)
GLUCOSE: 91 mg/dL (ref 70–140)
Potassium: 4.5 mEq/L (ref 3.5–5.1)
Sodium: 140 mEq/L (ref 136–145)
Total Bilirubin: 0.41 mg/dL (ref 0.20–1.20)
Total Protein: 7.5 g/dL (ref 6.4–8.3)

## 2014-03-18 NOTE — Progress Notes (Signed)
  Radiation Oncology         [phone] ________________________________  Stereotactic Treatment Procedure Note  Name: Vicki Marshall MRN: [id number]  Date: 03/19/2014  DOB: 06-25-1945    SPECIAL TREATMENT PROCEDURE    ICD-9-CM ICD-10-CM  1. Breast cancer metastasized to brain, left 174.9 C50.912   198.3 C79.31  2. Breast cancer of upper-outer quadrant of left female breast 174.4 C50.412    3D TREATMENT PLANNING AND DOSIMETRY:  The patient's radiation plan was reviewed and approved by neurosurgery and radiation oncology prior to treatment.  It showed 3-dimensional radiation distributions overlaid onto the planning CT/MRI image set.  The [facility] for the target structures as well as the organs at risk were reviewed. The documentation of the 3D plan and dosimetry are filed in the radiation oncology EMR.  NARRATIVE:  Vicki Marshall was brought to the [employer] stereotactic radiation treatment machine and placed supine on the CT couch. The head frame was applied, and the patient was set up for stereotactic radiosurgery.  Neurosurgery was present for the set-up and delivery  SIMULATION VERIFICATION:  In the couch zero-angle position, the patient underwent Exactrac imaging using the Brainlab system with orthogonal KV images.  These were carefully aligned and repeated to confirm treatment position for each of the isocenters.  The Exactrac snap film verification was repeated at each couch angle.  SPECIAL TREATMENT PROCEDURE: Vicki Marshall received stereotactic radiosurgery to the following targets: Left lateral cerebellar 4 mm target was treated using 4 Circular Collimator Arcs to a prescription dose of 20 Gy.  ExacTrac registration was performed for each couch angle.  The 88.5% isodose line was prescribed.  The 10 mm collimator was used. Left medial cerebellar 3 mm target was treated using 3 Dynamic Conformal Arcs to a prescription dose of 20 Gy.  ExacTrac registration was performed for each  couch angle.  The 82.6% isodose line was prescribed.   The 7.5 mm collimator was used.  STEREOTACTIC TREATMENT MANAGEMENT:  Following delivery, the patient was transported to nursing in stable condition and monitored for possible acute effects.  Vital signs were recorded BP 115/69  Pulse 76  Temp(Src) 98.1 F (36.7 C) (Oral)  Resp 16  SpO2 100%. The patient tolerated treatment without significant acute effects, and was discharged to home in stable condition.    PLAN: Follow-up in one month.  ________________________________  Sheral Apley. Tammi Klippel, M.D.

## 2014-03-19 ENCOUNTER — Telehealth: Payer: Self-pay | Admitting: Oncology

## 2014-03-19 ENCOUNTER — Encounter: Payer: Self-pay | Admitting: Radiation Oncology

## 2014-03-19 ENCOUNTER — Other Ambulatory Visit: Payer: Medicare Other

## 2014-03-19 ENCOUNTER — Telehealth: Payer: Self-pay | Admitting: Nurse Practitioner

## 2014-03-19 ENCOUNTER — Ambulatory Visit (HOSPITAL_BASED_OUTPATIENT_CLINIC_OR_DEPARTMENT_OTHER): Payer: Medicare Other | Admitting: Nurse Practitioner

## 2014-03-19 ENCOUNTER — Ambulatory Visit
Admission: RE | Admit: 2014-03-19 | Discharge: 2014-03-19 | Disposition: A | Discharge status: Home / Self Care | Payer: Medicare Other | Source: Ambulatory Visit | Attending: Radiation Oncology | Admitting: Radiation Oncology

## 2014-03-19 VITALS — BP 115/69 | HR 76 | Temp 98.1°F | Resp 16

## 2014-03-19 VITALS — BP 139/71 | HR 95 | Temp 97.6°F | Resp 18 | Ht 66.0 in | Wt 115.4 lb

## 2014-03-19 DIAGNOSIS — Z510 Encounter for antineoplastic radiation therapy: Secondary | ICD-10-CM | POA: Diagnosis not present

## 2014-03-19 DIAGNOSIS — N1330 Unspecified hydronephrosis: Secondary | ICD-10-CM

## 2014-03-19 DIAGNOSIS — C7931 Secondary malignant neoplasm of brain: Secondary | ICD-10-CM

## 2014-03-19 DIAGNOSIS — C50912 Malignant neoplasm of unspecified site of left female breast: Secondary | ICD-10-CM

## 2014-03-19 DIAGNOSIS — C50412 Malignant neoplasm of upper-outer quadrant of left female breast: Secondary | ICD-10-CM

## 2014-03-19 DIAGNOSIS — C7800 Secondary malignant neoplasm of unspecified lung: Secondary | ICD-10-CM

## 2014-03-19 DIAGNOSIS — C7951 Secondary malignant neoplasm of bone: Secondary | ICD-10-CM

## 2014-03-19 DIAGNOSIS — C787 Secondary malignant neoplasm of liver and intrahepatic bile duct: Secondary | ICD-10-CM

## 2014-03-19 DIAGNOSIS — D709 Neutropenia, unspecified: Secondary | ICD-10-CM

## 2014-03-19 DIAGNOSIS — Z170 Estrogen receptor positive status [ER+]: Secondary | ICD-10-CM

## 2014-03-19 NOTE — Progress Notes (Addendum)
Nurse monitoring following SRS treatment complete. Patient understands to rest and contact staff with needs. Vitals WDL. Patient denies pain. Patient denies headache, dizziness, nausea, diplopia or ringing in the ears. Patient discharged home with husband. Steady gait noted as patient ambulated out. Patient denies taking decadron at this time.

## 2014-03-19 NOTE — Telephone Encounter (Signed)
per pof to sch pt appt -cld pt & left message to adv of lab time & appt

## 2014-03-19 NOTE — Op Note (Signed)
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 Tyler Pita, MD  TECHNIQUE:  The patient underwent a radiation treatment planning session in the radiation oncology simulation suite under the care of the radiation oncology physician and physicist.  I participated closely in the radiation treatment planning afterwards. The patient underwent planning CT which was fused to 3T high resolution MRI with 1 mm axial slices.  These images were fused on the planning system.  We contoured the gross target volumes and subsequently expanded this to yield the Planning Target Volume. I actively participated in the planning process.  I helped to define and review the target contours and also the contours of the optic pathway, eyes, brainstem and selected nearby organs at risk.  All the dose constraints for critical structures were reviewed and compared to AAPM Task Group 101.  The prescription dose conformity was reviewed.  I approved the plan electronically.    Accordingly, Vicki Marshall  was brought to the TrueBeam stereotactic radiation treatment linac and placed in the custom immobilization mask.  The patient was aligned according to the IR fiducial markers with BrainLab Exactrac, then orthogonal x-rays were used in ExacTrac with the 6DOF robotic table and the shifts were made to align the patient  Vicki Marshall received stereotactic radiosurgery to a prescription dose of 20Gy to both lesions uneventfully.    The detailed description of the procedure is recorded in the radiation oncology procedure note.  I was present for the duration of the procedure.  DISPOSITION:   Following delivery, the patient was transported to nursing in  stable condition and monitored for possible acute effects to be discharged to home in stable condition with follow-up in one month.  Consuella Lose, MD [facility]

## 2014-03-19 NOTE — Telephone Encounter (Signed)
per pof to sch pt appt-pt aware of appt per HF

## 2014-03-20 ENCOUNTER — Encounter: Payer: Self-pay | Admitting: Radiation Oncology

## 2014-03-20 DIAGNOSIS — Z510 Encounter for antineoplastic radiation therapy: Secondary | ICD-10-CM | POA: Diagnosis not present

## 2014-03-20 NOTE — Progress Notes (Signed)
  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  Left lateral cerebellar 4 mm target was treated using 4 Circular Collimator Arcs to a prescription dose of 20 Gy. ExacTrac registration was performed for each couch angle. The 88.5% isodose line was prescribed. The 10 mm collimator was used.  2.  Left medial cerebellar 3 mm target was treated using 3 Dynamic Conformal Arcs to a prescription dose of 20 Gy. ExacTrac registration was performed for each couch angle. The 82.6% isodose line was prescribed. The 7.5 mm collimator was used.  Beams/energy:   6 MV photons were delivered in the [facility] mode  Narrative: The patient tolerated radiation treatment relatively well.     Plan: The patient has completed radiation treatment. The patient will return to radiation oncology clinic for routine followup in one month. I advised them to call or return sooner if they have any questions or concerns related to their recovery or treatment. ________________________________  Sheral Apley. Tammi Klippel, M.D.

## 2014-03-22 ENCOUNTER — Encounter: Payer: Self-pay | Admitting: Nurse Practitioner

## 2014-03-22 DIAGNOSIS — D709 Neutropenia, unspecified: Secondary | ICD-10-CM | POA: Insufficient documentation

## 2014-03-22 NOTE — Progress Notes (Signed)
[facility]  [phone] [phone]     ID: Vicki Marshall OB: 1946-03-19  MR#: [id number]  CLE#:[id number]  PCP: Lilian Coma, MD GYN:   SU: Excell Seltzer OTHER MD: Arloa Koh, Altamese Cabal, Scott Minor DDS, Tyler Pita, Franchot Gallo  CHIEF COMPLAINT: stage IV breast cancer, estrogen receptor positive  TREATMENT: Letrozole, fulvestrant, Palbociclib, and denosumab  BREAST CANCER HISTORY: From the original intake note 11/04/2013:  Eulonda noted a change in her left breast approximately mid 2014. This was a flattening of the lateral aspect of the left breast. Eventually she noted an indentation and eventually a "sore" in that area. She had not shared this information with her husband, and she did not have a primary care physician. More recently, as she started to have symptoms related to this, she mentioned it to her husband Vicki Marshall and she establish yourself with Dr. Stephanie Acre, who set her up for bilateral diagnostic mammography and ultrasonography at the [facility] 10/23/2013.. This showed a breast density to be category C. The right breast was negative.  The left breast contained an ulcerated mass laterally measuring at least 4 cm mammography. On physical exam there was an ulcerated mass in the 2:00 location of the left breast, associated with erythematous skin nodules in the upper inner quadrant. The left breast was smaller than the right, and firm. There were palpable left axillary lymph nodes. In addition, on the right upper chest wall, infraclavicular like, there was a firm nodule separate from the bony structures. There was also a right preauricular node and a firm left submandibular node.  Ultrasound showed multiple nodules throughout the lower portion of the left breast. There was a large mass in the upper outer quadrant of the left breast contiguous with the skin ulceration, measuring greater than 5 cm. The left axilla showed  at least 3 suspicious masses, the largest measuring 1.9 cm. Ultrasound of the palpable abnormality of the right chest wall showed a superficial irregular mass, subdermal, measuring 0.8 cm.  Biopsies of the left breast mass, a left axillary lymph node, and the right infra-clavicular mass on 10/23/2013, all showed ([id number]) and invasive ductal carcinoma, grade 1 or 2, estrogen receptor 100% positive, progesterone receptor 23% positive, both with strong staining intensity, with an MIB-1 of 61%, and no HER-2 amplification, the signals ratio being 0.90, and the number per cell 1.80.  On 11/02/2013 the patient underwent bilateral breast MRI and this showed, in the left breast, a 5.8 cm mass eroding through the overlying skin, and also invading the underlying pectoralis muscle and chest wall. There were multiple enhancing nodules throughout the left breast and an adjacent 4.8 cm irregular enhancing mass inferiorly. There are multiple large irregular left axillary lymph nodes, the largest measuring 2.4 cm. There was a 0.9 cm left subpectoral lymph node as well as prevascular and precarinal lymph nodes.  In the right breast far upper inner quadrant there was a 0.8 cm irregular enhancing mass abutting the overlying skin. This is the mass that was biopsied and shown to be positive. In addition there was patchy heterogeneous enhancement of the sternum.  The patient's subsequent history is as detailed below  INTERVAL HISTORY: Vicki Marshall returned on 03/19/14 for follow up of her breast cancer accompanied by her husband, Vicki Marshall. It was day 1 of her 21 day cycle of palbociclib, but she had not started yet, awaiting clearance to restart at this visit. She continues on letrozole, fulvestrant, and denosumab as well with few complaints.  Since her last visit she has begun stereotactic radiation to the lesions in her brain.    REVIEW OF SYSTEMS: [name] denies pain, fevers, chills, nausea, vomiting, or changes in bowel or  bladder habits. She denies hot flashes or vaginal dryness. She is occassionally fatigued, but is still as active as possible. Her appetite has remained the same, and she drinks one "ensure Plus" per day before bed. She has not shortness of breath, chest pain, palpations, or cough. Her hair is thinning, but she still has a fair amount to style. She tolerated the fulvestrant well, besides discomfort with the local injection site. A detailed review of systems is otherwise noncontributory.   PAST MEDICAL HISTORY: Past Medical History  Diagnosis Date  . Urinary tract infection   . Anemia   . Breast cancer     T3N2M1 stage IV invasive ductal carcinoma   . Brain metastases   . Hydronephrosis, left   . Lung metastases   . Bone metastases     PAST SURGICAL HISTORY: Past Surgical History  Procedure Laterality Date  . Cataract extraction  2013  . Mouth surgery  1981    FAMILY HISTORY: The patient's father died at the age of 40, from pneumonia. The patient's mother lived to be 55. The patient had no brothers, one sister. There is no history of breast or ovarian cancer in the family  GYNECOLOGIC HISTORY:  Menarche age 68. The patient is GX P0. She stopped having periods approximately 2003. She did not take hormone replacement. She used birth control pills for approximately 8 years remotely, with no complications   SOCIAL HISTORY:  [name] retired about a year ago. 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 Her husband Vicki Marshall is retired from working in [employer] fundraiser. They live alone, with no pets.    ADVANCED DIRECTIVES: Not in place   HEALTH MAINTENANCE: History  Substance Use Topics  . Smoking status: Never Smoker   . Smokeless tobacco: Never Used  . Alcohol Use: Yes     Comment: 10 12 glasses/week     Colonoscopy: Never  PAP: 1990  Bone density: Never  Lipid panel:  No Known Allergies  Current Outpatient Prescriptions  Medication Sig Dispense Refill  .  ALPRAZolam (XANAX) 0.5 MG tablet Take 1 tablet (0.5 mg total) by mouth at bedtime as needed for anxiety.  10 tablet  0  . aspirin 81 MG EC tablet Take 81 mg by mouth daily. Swallow whole.      . calcium carbonate (TUMS - DOSED IN MG ELEMENTAL CALCIUM) 500 MG chewable tablet Chew 1 tablet by mouth daily.      Marland Kitchen denosumab (PROLIA) 60 MG/ML SOLN injection Inject 60 mg into the skin every 30 (thirty) days. Administer in upper arm, thigh, or abdomen      . FULVESTRANT IM Inject 500 mg into the muscle.      . letrozole (FEMARA) 2.5 MG tablet Take 1 tablet (2.5 mg total) by mouth daily.  90 tablet  4  . LORazepam (ATIVAN) 0.5 MG tablet       . palbociclib (IBRANCE) 75 MG capsule Take 1 capsule (75 mg total) by mouth daily with breakfast. Take whole with food.  21 capsule  1   No current facility-administered medications for this visit.    OBJECTIVE: Middle-aged white woman who appears her stated age 71 Vitals:   03/19/14 1437  BP: 139/71  Pulse: 95  Temp: 97.6 F (36.4 C)  Resp: 18  Body mass index is 18.63 kg/(m^2).    ECOG FS:1 - Symptomatic but completely ambulatory Sclerae unicteric, pupils equal and reactive Oropharynx clear and moist-- no thrush No cervical or supraclavicular adenopathy Lungs no rales or rhonchi Heart regular rate and rhythm Abd soft, nontender, positive bowel sounds MSK no focal spinal tenderness, no upper extremity lymphedema Neuro: nonfocal, well oriented, appropriate affect Breasts: right breast unremarkable. Left breast appears similarly to photo captured below. No changes  Left breast photo from 01/06/2014   LAB RESULTS:  CMP     Component Value Date/Time   NA 140 03/17/2014 1521   K 4.5 03/17/2014 1521   CO2 23 03/17/2014 1521   GLUCOSE 91 03/17/2014 1521   BUN 27.6* 03/17/2014 1521   CREATININE 1.2* 03/17/2014 1521   CALCIUM 9.4 03/17/2014 1521   PROT 7.5 03/17/2014 1521   ALBUMIN 3.4* 03/17/2014 1521   AST 58* 03/17/2014 1521   ALT 33  03/17/2014 1521   ALKPHOS 170* 03/17/2014 1521   BILITOT 0.41 03/17/2014 1521    I No results found for this basename: SPEP,  UPEP,   kappa and lambda light chains    Lab Results  Component Value Date   WBC 2.2* 03/17/2014   NEUTROABS 0.8* 03/17/2014   HGB 9.2* 03/17/2014   HCT 28.6* 03/17/2014   MCV 103.8* 03/17/2014   PLT 182 03/17/2014      Chemistry      Component Value Date/Time   NA 140 03/17/2014 1521   K 4.5 03/17/2014 1521   CO2 23 03/17/2014 1521   BUN 27.6* 03/17/2014 1521   CREATININE 1.2* 03/17/2014 1521      Component Value Date/Time   CALCIUM 9.4 03/17/2014 1521   ALKPHOS 170* 03/17/2014 1521   AST 58* 03/17/2014 1521   ALT 33 03/17/2014 1521   BILITOT 0.41 03/17/2014 1521       No results found for this basename: LABCA2    No components found with this basename: LABCA125    No results found for this basename: INR,  in the last 168 hours  Urinalysis No results found for this basename: colorurine,  appearanceur,  labspec,  phurine,  glucoseu,  hgbur,  bilirubinur,  ketonesur,  proteinur,  urobilinogen,  nitrite,  leukocytesur    STUDIES: Mr Kizzie Fantasia Contrast  03-15-14   CLINICAL DATA:  S RS restaging. Metastatic breast cancer with left cerebellar lesion. Breast cancer on the left, in the upper outer quadrant.  EXAM: MRI HEAD WITHOUT AND WITH CONTRAST  TECHNIQUE: Multiplanar, multiecho pulse sequences of the brain and surrounding structures were obtained without and with intravenous contrast.  CONTRAST:  69m MULTIHANCE GADOBENATE DIMEGLUMINE 529 MG/ML IV SOLN  COMPARISON:  12/15/2013  FINDINGS: Diffusion imaging does not show any acute or subacute infarction. There are again seen to be mild chronic small-vessel ischemic changes of the cerebral hemispheric white matter. No cortical or large vessel territory infarction.  The previously seen focus of enhancement within the left cerebellum shows the same maximal diameter measurement of 4.2 mm, but is  slightly thinner. There is a second 3 mm focus of enhancement posterior to that which is newly seen. Both lesions show slight hyperintensity before contrast administration but do appear to enhance further. No other brain lesion. No hydrocephalus or extra-axial collection. Widespread osseous disease is noted affecting the calvarium and cervical spine.  IMPRESSION: Previously seen focus of enhancement in the left cerebellum is no larger, and may have decreased minimally in volume.  Second focus  of enhancement now seen posterior to that measuring 3 mm in diameter.   Electronically Signed   By: Nelson Chimes M.D.   On: 02/19/2014 13:32   Mr Liver W Wo Contrast  02/18/2014   CLINICAL DATA:  Metastatic breast cancer.  EXAM: MRI ABDOMEN WITHOUT AND WITH CONTRAST  TECHNIQUE: Multiplanar multisequence MR imaging of the abdomen was performed both before and after the administration of intravenous contrast.  CONTRAST:  24m MULTIHANCE GADOBENATE DIMEGLUMINE 529 MG/ML IV SOLN  COMPARISON:  PET-CT 02/02/2014  FINDINGS: As demonstrated on the PET scan there is diffuse hepatic metastatic disease. The lesions are throughout both lobes. The lesions are best demonstrated on the T2 weighted sequences as there is some motion artifact on the postcontrast images. The largest lesion in the left hepatic lobe on series 4, image number 23 measures 23.5 x 20 mm. This measured approximately 23.5 x 14.5 mm on the prior PET-CT. A right hepatic lobe lesion on series 4, image number 16 measures 19 x 16 mm and on the prior PET-CT D measured approximately 14 x 14 mm were lesions are demonstrated on the MRI.  The spleen is normal in size. No splenic lesions. The gallbladder is unremarkable. No common bile duct dilatation. The pancreas is unremarkable. 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  The stomach, duodenum, visualized small bowel and visualized colon are  unremarkable. No mesenteric or retroperitoneal mass or adenopathy. Small scattered lymph nodes are noted.  Diffuse osseous metastatic disease is demonstrated. Basilar metastatic lung lesions are also noted. A left lower breast masses again identified.  IMPRESSION: Diffuse hepatic metastatic disease with slight interval enlargement of 2 index lesions as discussed above.  Diffuse osseous metastatic disease, basilar lung metastasis and left breast mass are again noted.  Scattered small mesenteric and retroperitoneal lymph nodes but no overt adenopathy.   Electronically Signed   By: MKalman JewelsM.D.   On: 02/18/2014 13:18   Nm Pet Image Restag (ps) Skull Base To Thigh  02/02/2014   CLINICAL DATA:  Subsequent treatment strategy for breast cancer with metastasis to bone. Metastasis to brain. Left sided hydronephrosis. Lung metastasis.  EXAM: NUCLEAR MEDICINE PET SKULL BASE TO THIGH  TECHNIQUE: 5.8 mCi F-18 FDG was injected intravenously. Full-ring PET imaging was performed from the skull base to thigh after the radiotracer. CT data was obtained and used for attenuation correction and anatomic localization.  FASTING BLOOD GLUCOSE:  Value: 102 mg/dl  COMPARISON:  11/05/2013  FINDINGS: NECK  No areas of abnormal hypermetabolism.  CHEST  Hypermetabolic left axillary adenopathy. Nodes measure up to 9 mm and a S.U.V. max of 4.5 on image 67. On the prior exam, nodes measured up to 1.5 cm and a S.U.V. max of 9.8.  Left breast primary laterally measures 3.2 x 2.6 cm and a S.U.V. max of 9.0. Decreased in size from the prior exam, where it measured 4.2 x 3.9 cm and a S.U.V. max of 18.2. Adjacent skin thickening with hypermetabolism, suggesting a component of dermal involvement.  Near complete resolution of mediastinal and hilar nodal hypermetabolism. A focus of left hilar hypermetabolism measures a S.U.V. max of 3.4 versus a S.U.V. max of 11.2 on the prior. Hypermetabolic pulmonary nodules have resolved.  ABDOMEN/PELVIS   Hepatic metastasis. An index right liver lobe lesion measures 1.2 cm and a S.U.V. max of 6.4 on image 109. Similar in size to on the prior exam, where it measured a  S.U.V. max of 5.2.  The lesion just anterior to this in the right lobe of the liver measures 1.3 cm and a S.U.V. max of 5.3 on image 110. Not readily apparent on the prior CT, where it measured a S.U.V. max of 6.0.  Hypermetabolic lesion in the medial left liver lobe measures 1.6 cm and a S.U.V. max of 6.7 today. S.U.V. max of 4.7 on the prior exam.  SKELETON  Extensive osseous metastasis. An index hypermetabolic focus involving the left side of the sacrum measures a S.U.V. max of 4.9 versus a S.U.V. max of 9.0 on the prior. Similar decreased hypermetabolism throughout the thoracolumbar spine.  CT IMAGES PERFORMED FOR ATTENUATION CORRECTION  No cervical adenopathy. Mild cardiomegaly. Decrease in pulmonary nodules. Right lower lobe 1.1 cm nodule on image 48 measures 1.4 cm on the prior. Right lower lobe 4 mm nodule on image 43 measures 7 mm on the prior  Similar moderate right-sided hydronephrosis with overlying cortical thinning. No definite cause identified. Pelvic floor laxity. Progressive left-sided posterior rib fractures, including a displaced fracture at the seventh.  IMPRESSION: 1. Response to therapy of left breast primary, axillary adenopathy, thoracic nodal metastasis, and pulmonary metastasis. 2. Hepatic metastasis which are felt to be minimally progressive. 3. Slight improvement in osseous metastasis. 4. Similar moderate right-sided hydronephrosis, without cause identified. Question chronic UPJ obstruction.   Electronically Signed   By: Abigail Miyamoto M.D.   On: 02/02/2014 11:19   ASSESSMENT: 68 y.o. [place] woman status post left breast in left axillary lymph node biopsy as well as right infraclavicular chest wall biopsy 10/23/2013 for a clinical T3, N2, M1, stage IV invasive ductal carcinoma, grade 2, estrogen receptor 100% positive,  progesterone receptor 23% positive, with an MIB-1 of 61%, and no HER-2 amplification.  (1) staging studies 11/05/2013 showed metastatic involvement of bone, liver, lungs, and brain  (2) antiestrogen therapy with letrozole and fulvestrant as well as targeted therapy with palbociclib started 11/11/2013  (3) denosumab monthly started 11/11/2013  (4) Right hydronephrosis w/o obstruction-- followed by urology ([facility])  PLAN: Frimet looks and feels well today. The labs were reviewed in detail and for the last 2 weeks her ANC was 0.8. I consulted with Dr. Jana Hakim, and he would like to hold the restart of her palbociclib by 1 week to allow her counts to recover. If she is unable to rebound to an [place] of at least 1.5, we will drop the palbociclib dose to 25m every other day. I explained this to COsakaand she understands that her current dose of 767mdaily may be too high. She has been encouraged to practice good hand hygiene, avoid sick contacts and crowds, and call if she has a fever above 100.75F.  CaJohnellill return Wednesday 10/21 for this lab draw, and will call as soon after to find out the result of her CBC. She understands the dosing schedule in either case, but will confirm with VaMarlon PelRN about what she is to do about her palbociclib given these guidelines. CaWilmaryill return in 6 weeks for a follow up visit with Dr. MaJana HakimShe understands and agrees with this plan. She knows the goal of treatment in her case is control. She has been encouraged to call with any issues that might arise before her next visit here.   FeMarcelino DusterNP

## 2014-03-24 ENCOUNTER — Other Ambulatory Visit (HOSPITAL_BASED_OUTPATIENT_CLINIC_OR_DEPARTMENT_OTHER): Payer: Medicare Other

## 2014-03-24 DIAGNOSIS — C50412 Malignant neoplasm of upper-outer quadrant of left female breast: Secondary | ICD-10-CM

## 2014-03-24 DIAGNOSIS — C787 Secondary malignant neoplasm of liver and intrahepatic bile duct: Secondary | ICD-10-CM

## 2014-03-24 DIAGNOSIS — C7951 Secondary malignant neoplasm of bone: Secondary | ICD-10-CM

## 2014-03-24 DIAGNOSIS — C7800 Secondary malignant neoplasm of unspecified lung: Secondary | ICD-10-CM

## 2014-03-24 DIAGNOSIS — C50912 Malignant neoplasm of unspecified site of left female breast: Secondary | ICD-10-CM

## 2014-03-24 DIAGNOSIS — C7931 Secondary malignant neoplasm of brain: Secondary | ICD-10-CM

## 2014-03-24 DIAGNOSIS — C50919 Malignant neoplasm of unspecified site of unspecified female breast: Secondary | ICD-10-CM

## 2014-03-24 LAB — COMPREHENSIVE METABOLIC PANEL (CC13)
ALK PHOS: 195 U/L — AB (ref 40–150)
ALT: 61 U/L — ABNORMAL HIGH (ref 0–55)
ANION GAP: 11 meq/L (ref 3–11)
AST: 97 U/L — ABNORMAL HIGH (ref 5–34)
Albumin: 3.5 g/dL (ref 3.5–5.0)
BUN: 24 mg/dL (ref 7.0–26.0)
CO2: 22 meq/L (ref 22–29)
Calcium: 8.9 mg/dL (ref 8.4–10.4)
Chloride: 102 mEq/L (ref 98–109)
Creatinine: 1.1 mg/dL (ref 0.6–1.1)
GLUCOSE: 158 mg/dL — AB (ref 70–140)
Potassium: 4.5 mEq/L (ref 3.5–5.1)
SODIUM: 135 meq/L — AB (ref 136–145)
TOTAL PROTEIN: 7.6 g/dL (ref 6.4–8.3)
Total Bilirubin: 0.69 mg/dL (ref 0.20–1.20)

## 2014-03-24 LAB — CBC WITH DIFFERENTIAL/PLATELET
BASO%: 1.7 % (ref 0.0–2.0)
Basophils Absolute: 0.1 10*3/uL (ref 0.0–0.1)
EOS ABS: 0 10*3/uL (ref 0.0–0.5)
EOS%: 0.3 % (ref 0.0–7.0)
HCT: 30.9 % — ABNORMAL LOW (ref 34.8–46.6)
HGB: 9.8 g/dL — ABNORMAL LOW (ref 11.6–15.9)
LYMPH#: 0.6 10*3/uL — AB (ref 0.9–3.3)
LYMPH%: 16.3 % (ref 14.0–49.7)
MCH: 32.8 pg (ref 25.1–34.0)
MCHC: 31.7 g/dL (ref 31.5–36.0)
MCV: 103.3 fL — ABNORMAL HIGH (ref 79.5–101.0)
MONO#: 0.5 10*3/uL (ref 0.1–0.9)
MONO%: 15.1 % — ABNORMAL HIGH (ref 0.0–14.0)
NEUT%: 66.6 % (ref 38.4–76.8)
NEUTROS ABS: 2.3 10*3/uL (ref 1.5–6.5)
Platelets: 236 10*3/uL (ref 145–400)
RBC: 2.99 10*6/uL — AB (ref 3.70–5.45)
RDW: 19.3 % — AB (ref 11.2–14.5)
WBC: 3.5 10*3/uL — AB (ref 3.9–10.3)

## 2014-03-31 ENCOUNTER — Ambulatory Visit (HOSPITAL_BASED_OUTPATIENT_CLINIC_OR_DEPARTMENT_OTHER): Payer: Medicare Other

## 2014-03-31 ENCOUNTER — Other Ambulatory Visit (HOSPITAL_BASED_OUTPATIENT_CLINIC_OR_DEPARTMENT_OTHER): Payer: Medicare Other

## 2014-03-31 DIAGNOSIS — C7951 Secondary malignant neoplasm of bone: Secondary | ICD-10-CM

## 2014-03-31 DIAGNOSIS — C7931 Secondary malignant neoplasm of brain: Secondary | ICD-10-CM

## 2014-03-31 DIAGNOSIS — C7800 Secondary malignant neoplasm of unspecified lung: Secondary | ICD-10-CM

## 2014-03-31 DIAGNOSIS — Z5111 Encounter for antineoplastic chemotherapy: Secondary | ICD-10-CM

## 2014-03-31 DIAGNOSIS — C50412 Malignant neoplasm of upper-outer quadrant of left female breast: Secondary | ICD-10-CM

## 2014-03-31 DIAGNOSIS — C787 Secondary malignant neoplasm of liver and intrahepatic bile duct: Secondary | ICD-10-CM

## 2014-03-31 DIAGNOSIS — C50912 Malignant neoplasm of unspecified site of left female breast: Secondary | ICD-10-CM

## 2014-03-31 DIAGNOSIS — C50919 Malignant neoplasm of unspecified site of unspecified female breast: Secondary | ICD-10-CM

## 2014-03-31 LAB — COMPREHENSIVE METABOLIC PANEL (CC13)
ALK PHOS: 265 U/L — AB (ref 40–150)
ALT: 43 U/L (ref 0–55)
AST: 56 U/L — ABNORMAL HIGH (ref 5–34)
Albumin: 2.6 g/dL — ABNORMAL LOW (ref 3.5–5.0)
Anion Gap: 9 mEq/L (ref 3–11)
BILIRUBIN TOTAL: 0.47 mg/dL (ref 0.20–1.20)
BUN: 22.9 mg/dL (ref 7.0–26.0)
CO2: 22 meq/L (ref 22–29)
Calcium: 9.1 mg/dL (ref 8.4–10.4)
Chloride: 104 mEq/L (ref 98–109)
Creatinine: 1.4 mg/dL — ABNORMAL HIGH (ref 0.6–1.1)
Glucose: 118 mg/dl (ref 70–140)
Potassium: 4.4 mEq/L (ref 3.5–5.1)
SODIUM: 135 meq/L — AB (ref 136–145)
Total Protein: 6.8 g/dL (ref 6.4–8.3)

## 2014-03-31 LAB — CBC WITH DIFFERENTIAL/PLATELET
BASO%: 0.7 % (ref 0.0–2.0)
Basophils Absolute: 0 10*3/uL (ref 0.0–0.1)
EOS%: 1.3 % (ref 0.0–7.0)
Eosinophils Absolute: 0.1 10*3/uL (ref 0.0–0.5)
HCT: 25.2 % — ABNORMAL LOW (ref 34.8–46.6)
HGB: 8.1 g/dL — ABNORMAL LOW (ref 11.6–15.9)
LYMPH%: 22.3 % (ref 14.0–49.7)
MCH: 32.7 pg (ref 25.1–34.0)
MCHC: 32.1 g/dL (ref 31.5–36.0)
MCV: 101.6 fL — ABNORMAL HIGH (ref 79.5–101.0)
MONO#: 0.3 10*3/uL (ref 0.1–0.9)
MONO%: 7.1 % (ref 0.0–14.0)
NEUT#: 3.1 10*3/uL (ref 1.5–6.5)
NEUT%: 68.6 % (ref 38.4–76.8)
PLATELETS: 327 10*3/uL (ref 145–400)
RBC: 2.48 10*6/uL — AB (ref 3.70–5.45)
RDW: 18.9 % — ABNORMAL HIGH (ref 11.2–14.5)
WBC: 4.5 10*3/uL (ref 3.9–10.3)
lymph#: 1 10*3/uL (ref 0.9–3.3)

## 2014-03-31 MED ORDER — DENOSUMAB 120 MG/1.7ML ~~LOC~~ SOLN
120.0000 mg | Freq: Once | SUBCUTANEOUS | Status: AC
  Administered 2014-03-31: 120 mg via SUBCUTANEOUS
  Filled 2014-03-31: qty 1.7

## 2014-03-31 MED ORDER — FULVESTRANT 250 MG/5ML IM SOLN
500.0000 mg | Freq: Once | INTRAMUSCULAR | Status: AC
  Administered 2014-03-31: 500 mg via INTRAMUSCULAR
  Filled 2014-03-31: qty 10

## 2014-03-31 NOTE — Patient Instructions (Signed)
Fulvestrant injection What is this medicine? FULVESTRANT (ful VES trant) blocks the effects of estrogen. It is used to treat breast cancer in women past the age of menopause. This medicine may be used for other purposes; ask your health care provider or pharmacist if you have questions. COMMON BRAND NAME(S): FASLODEX What should I tell my health care provider before I take this medicine? They need to know if you have any of these conditions: -bleeding problems -liver disease -low levels of platelets in the blood -an unusual or allergic reaction to fulvestrant, other medicines, foods, dyes, or preservatives -pregnant or trying to get pregnant -breast-feeding How should I use this medicine? This medicine is for injection into a muscle. It is usually given by a health care professional in a hospital or clinic setting. Talk to your pediatrician regarding the use of this medicine in children. Special care may be needed. Overdosage: If you think you have taken too much of this medicine contact a poison control center or emergency room at once. NOTE: This medicine is only for you. Do not share this medicine with others. What if I miss a dose? It is important not to miss your dose. Call your doctor or health care professional if you are unable to keep an appointment. What may interact with this medicine? -medicines that treat or prevent blood clots like warfarin, enoxaparin, and dalteparin This list may not describe all possible interactions. Give your health care provider a list of all the medicines, herbs, non-prescription drugs, or dietary supplements you use. Also tell them if you smoke, drink alcohol, or use illegal drugs. Some items may interact with your medicine. What should I watch for while using this medicine? Your condition will be monitored carefully while you are receiving this medicine. You will need important blood work done while you are taking this medicine. Do not become pregnant  while taking this medicine. Women should inform their doctor if they wish to become pregnant or think they might be pregnant. There is a potential for serious side effects to an unborn child. Talk to your health care professional or pharmacist for more information. What side effects may I notice from receiving this medicine? Side effects that you should report to your doctor or health care professional as soon as possible: -allergic reactions like skin rash, itching or hives, swelling of the face, lips, or tongue -feeling faint or lightheaded, falls -fever or flu-like symptoms -sore throat -vaginal bleeding Side effects that usually do not require medical attention (report to your doctor or health care professional if they continue or are bothersome): -aches, pains -constipation or diarrhea -headache -hot flashes -nausea, vomiting -pain at site where injected -stomach pain This list may not describe all possible side effects. Call your doctor for medical advice about side effects. You may report side effects to FDA at 1-800-FDA-1088. Where should I keep my medicine? This drug is given in a hospital or clinic and will not be stored at home. NOTE: This sheet is a summary. It may not cover all possible information. If you have questions about this medicine, talk to your doctor, pharmacist, or health care provider.  2015, Elsevier/Gold Standard. (2007-09-29 15:39:24)  Denosumab injection What is this medicine? DENOSUMAB (den oh sue mab) slows bone breakdown. Prolia is used to treat osteoporosis in women after menopause and in men. Xgeva is used to prevent bone fractures and other bone problems caused by cancer bone metastases. Xgeva is also used to treat giant cell tumor of the bone.   This medicine may be used for other purposes; ask your health care provider or pharmacist if you have questions. COMMON BRAND NAME(S): Prolia, XGEVA What should I tell my health care provider before I take this  medicine? They need to know if you have any of these conditions: -dental disease -eczema -infection or history of infections -kidney disease or on dialysis -low blood calcium or vitamin D -malabsorption syndrome -scheduled to have surgery or tooth extraction -taking medicine that contains denosumab -thyroid or parathyroid disease -an unusual reaction to denosumab, other medicines, foods, dyes, or preservatives -pregnant or trying to get pregnant -breast-feeding How should I use this medicine? This medicine is for injection under the skin. It is given by a health care professional in a hospital or clinic setting. If you are getting Prolia, a special MedGuide will be given to you by the pharmacist with each prescription and refill. Be sure to read this information carefully each time. For Prolia, talk to your pediatrician regarding the use of this medicine in children. Special care may be needed. For Xgeva, talk to your pediatrician regarding the use of this medicine in children. While this drug may be prescribed for children as young as 13 years for selected conditions, precautions do apply. Overdosage: If you think you've taken too much of this medicine contact a poison control center or emergency room at once. Overdosage: If you think you have taken too much of this medicine contact a poison control center or emergency room at once. NOTE: This medicine is only for you. Do not share this medicine with others. What if I miss a dose? It is important not to miss your dose. Call your doctor or health care professional if you are unable to keep an appointment. What may interact with this medicine? Do not take this medicine with any of the following medications: -other medicines containing denosumab This medicine may also interact with the following medications: -medicines that suppress the immune system -medicines that treat cancer -steroid medicines like prednisone or cortisone This list  may not describe all possible interactions. Give your health care provider a list of all the medicines, herbs, non-prescription drugs, or dietary supplements you use. Also tell them if you smoke, drink alcohol, or use illegal drugs. Some items may interact with your medicine. What should I watch for while using this medicine? Visit your doctor or health care professional for regular checks on your progress. Your doctor or health care professional may order blood tests and other tests to see how you are doing. Call your doctor or health care professional if you get a cold or other infection while receiving this medicine. Do not treat yourself. This medicine may decrease your body's ability to fight infection. You should make sure you get enough calcium and vitamin D while you are taking this medicine, unless your doctor tells you not to. Discuss the foods you eat and the vitamins you take with your health care professional. See your dentist regularly. Brush and floss your teeth as directed. Before you have any dental work done, tell your dentist you are receiving this medicine. Do not become pregnant while taking this medicine or for 5 months after stopping it. Women should inform their doctor if they wish to become pregnant or think they might be pregnant. There is a potential for serious side effects to an unborn child. Talk to your health care professional or pharmacist for more information. What side effects may I notice from receiving this medicine? Side effects that   you should report to your doctor or health care professional as soon as possible: -allergic reactions like skin rash, itching or hives, swelling of the face, lips, or tongue -breathing problems -chest pain -fast, irregular heartbeat -feeling faint or lightheaded, falls -fever, chills, or any other sign of infection -muscle spasms, tightening, or twitches -numbness or tingling -skin blisters or bumps, or is dry, peels, or red -slow  healing or unexplained pain in the mouth or jaw -unusual bleeding or bruising Side effects that usually do not require medical attention (Report these to your doctor or health care professional if they continue or are bothersome.): -muscle pain -stomach upset, gas This list may not describe all possible side effects. Call your doctor for medical advice about side effects. You may report side effects to FDA at 1-800-FDA-1088. Where should I keep my medicine? This medicine is only given in a clinic, doctor's office, or other health care setting and will not be stored at home. NOTE: This sheet is a summary. It may not cover all possible information. If you have questions about this medicine, talk to your doctor, pharmacist, or health care provider.  2015, Elsevier/Gold Standard. (2011-11-19 12:37:47)  

## 2014-04-07 ENCOUNTER — Other Ambulatory Visit (HOSPITAL_BASED_OUTPATIENT_CLINIC_OR_DEPARTMENT_OTHER): Payer: Medicare Other

## 2014-04-07 DIAGNOSIS — C7931 Secondary malignant neoplasm of brain: Secondary | ICD-10-CM

## 2014-04-07 DIAGNOSIS — C50412 Malignant neoplasm of upper-outer quadrant of left female breast: Secondary | ICD-10-CM

## 2014-04-07 DIAGNOSIS — C50912 Malignant neoplasm of unspecified site of left female breast: Secondary | ICD-10-CM

## 2014-04-07 DIAGNOSIS — C7951 Secondary malignant neoplasm of bone: Secondary | ICD-10-CM

## 2014-04-07 DIAGNOSIS — C50919 Malignant neoplasm of unspecified site of unspecified female breast: Secondary | ICD-10-CM

## 2014-04-07 DIAGNOSIS — C787 Secondary malignant neoplasm of liver and intrahepatic bile duct: Secondary | ICD-10-CM

## 2014-04-07 LAB — COMPREHENSIVE METABOLIC PANEL (CC13)
ALT: 34 U/L (ref 0–55)
AST: 57 U/L — AB (ref 5–34)
Albumin: 3 g/dL — ABNORMAL LOW (ref 3.5–5.0)
Alkaline Phosphatase: 240 U/L — ABNORMAL HIGH (ref 40–150)
Anion Gap: 10 mEq/L (ref 3–11)
BILIRUBIN TOTAL: 0.36 mg/dL (ref 0.20–1.20)
BUN: 25.9 mg/dL (ref 7.0–26.0)
CO2: 24 mEq/L (ref 22–29)
CREATININE: 1.3 mg/dL — AB (ref 0.6–1.1)
Calcium: 9.3 mg/dL (ref 8.4–10.4)
Chloride: 105 mEq/L (ref 98–109)
Glucose: 80 mg/dl (ref 70–140)
Potassium: 4.6 mEq/L (ref 3.5–5.1)
Sodium: 138 mEq/L (ref 136–145)
Total Protein: 7.7 g/dL (ref 6.4–8.3)

## 2014-04-07 LAB — CBC WITH DIFFERENTIAL/PLATELET
BASO%: 1.7 % (ref 0.0–2.0)
Basophils Absolute: 0 10*3/uL (ref 0.0–0.1)
EOS%: 1.7 % (ref 0.0–7.0)
Eosinophils Absolute: 0 10*3/uL (ref 0.0–0.5)
HEMATOCRIT: 26.8 % — AB (ref 34.8–46.6)
HGB: 8.4 g/dL — ABNORMAL LOW (ref 11.6–15.9)
LYMPH%: 37.4 % (ref 14.0–49.7)
MCH: 32.6 pg (ref 25.1–34.0)
MCHC: 31.3 g/dL — ABNORMAL LOW (ref 31.5–36.0)
MCV: 103.9 fL — ABNORMAL HIGH (ref 79.5–101.0)
MONO#: 0.1 10*3/uL (ref 0.1–0.9)
MONO%: 4.7 % (ref 0.0–14.0)
NEUT#: 1.3 10*3/uL — ABNORMAL LOW (ref 1.5–6.5)
NEUT%: 54.5 % (ref 38.4–76.8)
PLATELETS: 272 10*3/uL (ref 145–400)
RBC: 2.58 10*6/uL — ABNORMAL LOW (ref 3.70–5.45)
RDW: 18.7 % — ABNORMAL HIGH (ref 11.2–14.5)
WBC: 2.4 10*3/uL — ABNORMAL LOW (ref 3.9–10.3)
lymph#: 0.9 10*3/uL (ref 0.9–3.3)

## 2014-04-14 ENCOUNTER — Telehealth: Payer: Self-pay | Admitting: Nurse Practitioner

## 2014-04-14 ENCOUNTER — Other Ambulatory Visit (HOSPITAL_BASED_OUTPATIENT_CLINIC_OR_DEPARTMENT_OTHER): Payer: Medicare Other

## 2014-04-14 DIAGNOSIS — C7951 Secondary malignant neoplasm of bone: Secondary | ICD-10-CM

## 2014-04-14 DIAGNOSIS — C50919 Malignant neoplasm of unspecified site of unspecified female breast: Secondary | ICD-10-CM

## 2014-04-14 DIAGNOSIS — C7931 Secondary malignant neoplasm of brain: Secondary | ICD-10-CM

## 2014-04-14 DIAGNOSIS — C50912 Malignant neoplasm of unspecified site of left female breast: Secondary | ICD-10-CM

## 2014-04-14 DIAGNOSIS — C50412 Malignant neoplasm of upper-outer quadrant of left female breast: Secondary | ICD-10-CM

## 2014-04-14 LAB — CBC WITH DIFFERENTIAL/PLATELET
BASO%: 3.4 % — ABNORMAL HIGH (ref 0.0–2.0)
Basophils Absolute: 0.1 10*3/uL (ref 0.0–0.1)
EOS ABS: 0 10*3/uL (ref 0.0–0.5)
EOS%: 0.6 % (ref 0.0–7.0)
HEMATOCRIT: 24.9 % — AB (ref 34.8–46.6)
HGB: 7.9 g/dL — ABNORMAL LOW (ref 11.6–15.9)
LYMPH%: 51.4 % — ABNORMAL HIGH (ref 14.0–49.7)
MCH: 33.2 pg (ref 25.1–34.0)
MCHC: 31.7 g/dL (ref 31.5–36.0)
MCV: 104.6 fL — AB (ref 79.5–101.0)
MONO#: 0.2 10*3/uL (ref 0.1–0.9)
MONO%: 10.9 % (ref 0.0–14.0)
NEUT%: 33.7 % — AB (ref 38.4–76.8)
NEUTROS ABS: 0.6 10*3/uL — AB (ref 1.5–6.5)
Platelets: 154 10*3/uL (ref 145–400)
RBC: 2.38 10*6/uL — ABNORMAL LOW (ref 3.70–5.45)
RDW: 19.7 % — ABNORMAL HIGH (ref 11.2–14.5)
WBC: 1.8 10*3/uL — ABNORMAL LOW (ref 3.9–10.3)
lymph#: 0.9 10*3/uL (ref 0.9–3.3)

## 2014-04-14 LAB — COMPREHENSIVE METABOLIC PANEL (CC13)
ALBUMIN: 2.9 g/dL — AB (ref 3.5–5.0)
ALT: 28 U/L (ref 0–55)
ANION GAP: 8 meq/L (ref 3–11)
AST: 47 U/L — AB (ref 5–34)
Alkaline Phosphatase: 184 U/L — ABNORMAL HIGH (ref 40–150)
BUN: 28.9 mg/dL — AB (ref 7.0–26.0)
CALCIUM: 8.9 mg/dL (ref 8.4–10.4)
CO2: 24 mEq/L (ref 22–29)
CREATININE: 1.3 mg/dL — AB (ref 0.6–1.1)
Chloride: 107 mEq/L (ref 98–109)
GLUCOSE: 78 mg/dL (ref 70–140)
Potassium: 4.5 mEq/L (ref 3.5–5.1)
Sodium: 139 mEq/L (ref 136–145)
Total Bilirubin: 0.34 mg/dL (ref 0.20–1.20)
Total Protein: 7.2 g/dL (ref 6.4–8.3)

## 2014-04-14 NOTE — Telephone Encounter (Signed)
Reviewed labs. ANC down to 0.6 today. Patient on 75mg  palbociclib daily, currently on day 19 of 21 day cycle. Advised patient to end cycle early, omitting today's and tomorrow's dose. Will repeat labs in 1 week. Patient to call for [place] before refilling med. Must be 1.5 or greater to restart med.

## 2014-04-19 ENCOUNTER — Ambulatory Visit
Admission: RE | Admit: 2014-04-19 | Discharge: 2014-04-19 | Disposition: A | Discharge status: Home / Self Care | Payer: Medicare Other | Source: Ambulatory Visit | Attending: Radiation Oncology | Admitting: Radiation Oncology

## 2014-04-19 ENCOUNTER — Encounter: Payer: Self-pay | Admitting: Radiation Oncology

## 2014-04-19 VITALS — BP 123/74 | HR 95 | Resp 16 | Wt 115.5 lb

## 2014-04-19 DIAGNOSIS — C50412 Malignant neoplasm of upper-outer quadrant of left female breast: Secondary | ICD-10-CM

## 2014-04-19 NOTE — Progress Notes (Signed)
Steady gait noted. Denies dizziness. Denies headache, nausea, or vomiting. Denies diplopia or ringing in the ears. Weight and vitals stable. Reports middle to low back pain intermittently. Denies pain today. Continues Ibrance at 75 mg. Reports fatigue but, hemoglobin has been running low. Denies seizures. Denies numbness or tingling in hands and feet.

## 2014-04-19 NOTE — Progress Notes (Signed)
  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    - Left medial cerebellar 3 mm to 20 Gy    ICD-9-CM ICD-10-CM   1. Breast cancer of upper-outer quadrant of left female breast 174.4 C50.412     Interval Since Last Radiation:  4  weeks  Narrative:  The patient returns today for routine follow-up.  Steady gait noted. Denies dizziness. Denies headache, nausea, or vomiting. Denies diplopia or ringing in the ears. Weight and vitals stable. Reports middle to low back pain intermittently. Denies pain today. Continues Ibrance at 75 mg. Reports fatigue but, hemoglobin has been running low. Denies seizures. Denies numbness or tingling in hands and feet.                               ALLERGIES:  has No Known Allergies.  Meds: Current Outpatient Prescriptions  Medication Sig Dispense Refill  . aspirin 81 MG EC tablet Take 81 mg by mouth daily. Swallow whole.    . calcium carbonate (TUMS - DOSED IN MG ELEMENTAL CALCIUM) 500 MG chewable tablet Chew 1 tablet by mouth daily.    Marland Kitchen denosumab (PROLIA) 60 MG/ML SOLN injection Inject 60 mg into the skin every 30 (thirty) days. Administer in upper arm, thigh, or abdomen    . FULVESTRANT IM Inject 500 mg into the muscle.    . letrozole (FEMARA) 2.5 MG tablet Take 1 tablet (2.5 mg total) by mouth daily. 90 tablet 4  . palbociclib (IBRANCE) 75 MG capsule Take 1 capsule (75 mg total) by mouth daily with breakfast. Take whole with food. 21 capsule 1  . ALPRAZolam (XANAX) 0.5 MG tablet Take 1 tablet (0.5 mg total) by mouth at bedtime as needed for anxiety. 10  tablet 0  . LORazepam (ATIVAN) 0.5 MG tablet      No current facility-administered medications for this encounter.    Physical Findings: The patient is in no acute distress. Patient is alert and oriented.  weight is 115 lb 8 oz (52.39 kg). Her blood pressure is 123/74 and her pulse is 95. Her respiration is 16. .  No significant changes.  Lab Findings: Lab Results  Component Value Date   WBC 1.8* 04/14/2014   HGB 7.9* 04/14/2014   HCT 24.9* 04/14/2014   MCV 104.6* 04/14/2014   PLT 154 04/14/2014   Impression:  The patient is recovering from the effects of radiation.    Plan:  MRI in 2 months then follow-up with Dr. Kathyrn Sheriff.  _____________________________________  Sheral Apley. Tammi Klippel, M.D.

## 2014-04-21 ENCOUNTER — Other Ambulatory Visit (HOSPITAL_BASED_OUTPATIENT_CLINIC_OR_DEPARTMENT_OTHER): Payer: Medicare Other

## 2014-04-21 DIAGNOSIS — C7800 Secondary malignant neoplasm of unspecified lung: Secondary | ICD-10-CM

## 2014-04-21 DIAGNOSIS — C7951 Secondary malignant neoplasm of bone: Secondary | ICD-10-CM

## 2014-04-21 DIAGNOSIS — C50919 Malignant neoplasm of unspecified site of unspecified female breast: Secondary | ICD-10-CM

## 2014-04-21 DIAGNOSIS — C50412 Malignant neoplasm of upper-outer quadrant of left female breast: Secondary | ICD-10-CM

## 2014-04-21 DIAGNOSIS — C787 Secondary malignant neoplasm of liver and intrahepatic bile duct: Secondary | ICD-10-CM

## 2014-04-21 DIAGNOSIS — C7931 Secondary malignant neoplasm of brain: Secondary | ICD-10-CM

## 2014-04-21 DIAGNOSIS — C50912 Malignant neoplasm of unspecified site of left female breast: Secondary | ICD-10-CM

## 2014-04-21 LAB — COMPREHENSIVE METABOLIC PANEL (CC13)
ALBUMIN: 3.3 g/dL — AB (ref 3.5–5.0)
ALT: 28 U/L (ref 0–55)
ANION GAP: 11 meq/L (ref 3–11)
AST: 60 U/L — ABNORMAL HIGH (ref 5–34)
Alkaline Phosphatase: 190 U/L — ABNORMAL HIGH (ref 40–150)
BUN: 29.6 mg/dL — ABNORMAL HIGH (ref 7.0–26.0)
CO2: 24 meq/L (ref 22–29)
Calcium: 9.6 mg/dL (ref 8.4–10.4)
Chloride: 104 mEq/L (ref 98–109)
Creatinine: 1.3 mg/dL — ABNORMAL HIGH (ref 0.6–1.1)
GLUCOSE: 87 mg/dL (ref 70–140)
POTASSIUM: 4.9 meq/L (ref 3.5–5.1)
Sodium: 139 mEq/L (ref 136–145)
TOTAL PROTEIN: 7.6 g/dL (ref 6.4–8.3)
Total Bilirubin: 0.38 mg/dL (ref 0.20–1.20)

## 2014-04-21 LAB — CBC WITH DIFFERENTIAL/PLATELET
BASO%: 3 % — ABNORMAL HIGH (ref 0.0–2.0)
BASOS ABS: 0.1 10*3/uL (ref 0.0–0.1)
EOS ABS: 0 10*3/uL (ref 0.0–0.5)
EOS%: 1 % (ref 0.0–7.0)
HCT: 26.4 % — ABNORMAL LOW (ref 34.8–46.6)
HEMOGLOBIN: 8.5 g/dL — AB (ref 11.6–15.9)
LYMPH#: 1.3 10*3/uL (ref 0.9–3.3)
LYMPH%: 44.5 % (ref 14.0–49.7)
MCH: 34.1 pg — ABNORMAL HIGH (ref 25.1–34.0)
MCHC: 32.2 g/dL (ref 31.5–36.0)
MCV: 106 fL — ABNORMAL HIGH (ref 79.5–101.0)
MONO#: 0.8 10*3/uL (ref 0.1–0.9)
MONO%: 25.6 % — AB (ref 0.0–14.0)
NEUT#: 0.8 10*3/uL — ABNORMAL LOW (ref 1.5–6.5)
NEUT%: 25.9 % — ABNORMAL LOW (ref 38.4–76.8)
Platelets: 144 10*3/uL — ABNORMAL LOW (ref 145–400)
RBC: 2.49 10*6/uL — AB (ref 3.70–5.45)
RDW: 21.1 % — ABNORMAL HIGH (ref 11.2–14.5)
WBC: 3 10*3/uL — AB (ref 3.9–10.3)
nRBC: 2 % — ABNORMAL HIGH (ref 0–0)

## 2014-04-28 ENCOUNTER — Ambulatory Visit (HOSPITAL_BASED_OUTPATIENT_CLINIC_OR_DEPARTMENT_OTHER): Payer: Medicare Other

## 2014-04-28 ENCOUNTER — Telehealth: Payer: Self-pay | Admitting: *Deleted

## 2014-04-28 ENCOUNTER — Other Ambulatory Visit (HOSPITAL_BASED_OUTPATIENT_CLINIC_OR_DEPARTMENT_OTHER): Payer: Medicare Other

## 2014-04-28 DIAGNOSIS — C50412 Malignant neoplasm of upper-outer quadrant of left female breast: Secondary | ICD-10-CM

## 2014-04-28 DIAGNOSIS — Z5111 Encounter for antineoplastic chemotherapy: Secondary | ICD-10-CM

## 2014-04-28 DIAGNOSIS — C7951 Secondary malignant neoplasm of bone: Secondary | ICD-10-CM

## 2014-04-28 DIAGNOSIS — C50919 Malignant neoplasm of unspecified site of unspecified female breast: Secondary | ICD-10-CM

## 2014-04-28 LAB — CBC WITH DIFFERENTIAL/PLATELET
BASO%: 3.5 % — AB (ref 0.0–2.0)
BASOS ABS: 0.1 10*3/uL (ref 0.0–0.1)
EOS%: 1.5 % (ref 0.0–7.0)
Eosinophils Absolute: 0.1 10*3/uL (ref 0.0–0.5)
HEMATOCRIT: 26.4 % — AB (ref 34.8–46.6)
HEMOGLOBIN: 8.2 g/dL — AB (ref 11.6–15.9)
LYMPH#: 1.4 10*3/uL (ref 0.9–3.3)
LYMPH%: 41.1 % (ref 14.0–49.7)
MCH: 33.1 pg (ref 25.1–34.0)
MCHC: 31.1 g/dL — AB (ref 31.5–36.0)
MCV: 106.5 fL — ABNORMAL HIGH (ref 79.5–101.0)
MONO#: 0.7 10*3/uL (ref 0.1–0.9)
MONO%: 21.4 % — ABNORMAL HIGH (ref 0.0–14.0)
NEUT#: 1.1 10*3/uL — ABNORMAL LOW (ref 1.5–6.5)
NEUT%: 32.5 % — AB (ref 38.4–76.8)
Platelets: 241 10*3/uL (ref 145–400)
RBC: 2.48 10*6/uL — ABNORMAL LOW (ref 3.70–5.45)
RDW: 21.1 % — ABNORMAL HIGH (ref 11.2–14.5)
WBC: 3.4 10*3/uL — AB (ref 3.9–10.3)
nRBC: 2 % — ABNORMAL HIGH (ref 0–0)

## 2014-04-28 MED ORDER — FULVESTRANT 250 MG/5ML IM SOLN
500.0000 mg | Freq: Once | INTRAMUSCULAR | Status: AC
  Administered 2014-04-28: 500 mg via INTRAMUSCULAR
  Filled 2014-04-28: qty 10

## 2014-04-28 MED ORDER — DENOSUMAB 120 MG/1.7ML ~~LOC~~ SOLN
120.0000 mg | Freq: Once | SUBCUTANEOUS | Status: AC
  Administered 2014-04-28: 120 mg via SUBCUTANEOUS
  Filled 2014-04-28: qty 1.7

## 2014-04-28 NOTE — Telephone Encounter (Signed)
Returned pt's call concerning whether she needs to start back on her [place]. Communicated with pt that based on her labs we need to wait per Dr. Jana Hakim. Pt will have repeat labs next week and we'll see if her numbers come up. Pt verbalized understanding. No further concerns. Message to be forwarded to Christeen Douglas

## 2014-05-05 ENCOUNTER — Other Ambulatory Visit: Payer: Medicare Other

## 2014-05-05 ENCOUNTER — Other Ambulatory Visit (HOSPITAL_BASED_OUTPATIENT_CLINIC_OR_DEPARTMENT_OTHER): Payer: Medicare Other

## 2014-05-05 ENCOUNTER — Telehealth: Payer: Self-pay | Admitting: Oncology

## 2014-05-05 ENCOUNTER — Ambulatory Visit (HOSPITAL_BASED_OUTPATIENT_CLINIC_OR_DEPARTMENT_OTHER): Payer: Medicare Other | Admitting: Oncology

## 2014-05-05 VITALS — BP 137/77 | HR 98 | Temp 97.9°F | Resp 18 | Ht 66.0 in | Wt 114.6 lb

## 2014-05-05 DIAGNOSIS — Z170 Estrogen receptor positive status [ER+]: Secondary | ICD-10-CM

## 2014-05-05 DIAGNOSIS — C787 Secondary malignant neoplasm of liver and intrahepatic bile duct: Secondary | ICD-10-CM

## 2014-05-05 DIAGNOSIS — C7951 Secondary malignant neoplasm of bone: Secondary | ICD-10-CM

## 2014-05-05 DIAGNOSIS — C50412 Malignant neoplasm of upper-outer quadrant of left female breast: Secondary | ICD-10-CM

## 2014-05-05 DIAGNOSIS — C50919 Malignant neoplasm of unspecified site of unspecified female breast: Secondary | ICD-10-CM

## 2014-05-05 DIAGNOSIS — C7931 Secondary malignant neoplasm of brain: Secondary | ICD-10-CM

## 2014-05-05 DIAGNOSIS — C50912 Malignant neoplasm of unspecified site of left female breast: Secondary | ICD-10-CM

## 2014-05-05 LAB — CBC WITH DIFFERENTIAL/PLATELET
BASO%: 3 % — ABNORMAL HIGH (ref 0.0–2.0)
BASOS ABS: 0.1 10*3/uL (ref 0.0–0.1)
EOS ABS: 0.1 10*3/uL (ref 0.0–0.5)
EOS%: 2.3 % (ref 0.0–7.0)
HCT: 27.4 % — ABNORMAL LOW (ref 34.8–46.6)
HGB: 8.6 g/dL — ABNORMAL LOW (ref 11.6–15.9)
LYMPH%: 28.2 % (ref 14.0–49.7)
MCH: 33.3 pg (ref 25.1–34.0)
MCHC: 31.4 g/dL — ABNORMAL LOW (ref 31.5–36.0)
MCV: 106.2 fL — AB (ref 79.5–101.0)
MONO#: 0.8 10*3/uL (ref 0.1–0.9)
MONO%: 19 % — ABNORMAL HIGH (ref 0.0–14.0)
NEUT%: 47.5 % (ref 38.4–76.8)
NEUTROS ABS: 2.1 10*3/uL (ref 1.5–6.5)
NRBC: 2 % — AB (ref 0–0)
Platelets: 291 10*3/uL (ref 145–400)
RBC: 2.58 10*6/uL — AB (ref 3.70–5.45)
RDW: 20.3 % — AB (ref 11.2–14.5)
WBC: 4.3 10*3/uL (ref 3.9–10.3)
lymph#: 1.2 10*3/uL (ref 0.9–3.3)

## 2014-05-05 NOTE — Telephone Encounter (Signed)
per pof to sch pt appt-cld & spoke to pt gve appt time & date-will mail copy of sch

## 2014-05-05 NOTE — Progress Notes (Signed)
[facility]  [phone] [phone]     ID: Vicki Marshall OB: 10/14/1945  MR#: [id number]  CSN#:[id number]  PCP: WOLTERS,SHARON A, MD GYN:   SU: Benjamin Hoxworth OTHER MD: Robert Murray, Cottonwood Jackson, Scott Minor DDS, Matthew Manning, Stephen Dahlstedt, Neelesh Nundkumar  CHIEF COMPLAINT: stage IV breast cancer, estrogen receptor positive  TREATMENT: Letrozole, fulvestrant, Palbociclib, and denosumab  BREAST CANCER HISTORY: From the original intake note 11/04/2013:  Brigitta noted a change in her left breast approximately mid 2014. This was a flattening of the lateral aspect of the left breast. Eventually she noted an indentation and eventually a "sore" in that area. She had not shared this information with her husband, and she did not have a primary care physician. More recently, as she started to have symptoms related to this, she mentioned it to her husband David and she establish yourself with Dr. Wolters, who set her up for bilateral diagnostic mammography and ultrasonography at the breast Center 10/23/2013.. This showed a breast density to be category C. The right breast was negative.  The left breast contained an ulcerated mass laterally measuring at least 4 cm mammography. On physical exam there was an ulcerated mass in the 2:00 location of the left breast, associated with erythematous skin nodules in the upper inner quadrant. The left breast was smaller than the right, and firm. There were palpable left axillary lymph nodes. In addition, on the right upper chest wall, infraclavicular like, there was a firm nodule separate from the bony structures. There was also a right preauricular node and a firm left submandibular node.  Ultrasound showed multiple nodules throughout the lower portion of the left breast. There was a large mass in the upper outer quadrant of the left breast contiguous with the skin ulceration, measuring greater than 5 cm. The  left axilla showed at least 3 suspicious masses, the largest measuring 1.9 cm. Ultrasound of the palpable abnormality of the right chest wall showed a superficial irregular mass, subdermal, measuring 0.8 cm.  Biopsies of the left breast mass, a left axillary lymph node, and the right infra-clavicular mass on 10/23/2013, all showed ([id number]) and invasive ductal carcinoma, grade 1 or 2, estrogen receptor 100% positive, progesterone receptor 23% positive, both with strong staining intensity, with an MIB-1 of 61%, and no HER-2 amplification, the signals ratio being 0.90, and the number per cell 1.80.  On 11/02/2013 the patient underwent bilateral breast MRI and this showed, in the left breast, a 5.8 cm mass eroding through the overlying skin, and also invading the underlying pectoralis muscle and chest wall. There were multiple enhancing nodules throughout the left breast and an adjacent 4.8 cm irregular enhancing mass inferiorly. There are multiple large irregular left axillary lymph nodes, the largest measuring 2.4 cm. There was a 0.9 cm left subpectoral lymph node as well as prevascular and precarinal lymph nodes.  In the right breast far upper inner quadrant there was a 0.8 cm irregular enhancing mass abutting the overlying skin. This is the mass that was biopsied and shown to be positive. In addition there was patchy heterogeneous enhancement of the sternum.  The patient's subsequent history is as detailed below  INTERVAL HISTORY: Vicki Marshall returns today for follow-up of her metastatic breast cancer accompanied by her husband, David. She underwent stereotactic radiosurgery to her 2 brain lesions and had absolutely no side effects from that. She did not require dexamethasone adjuvantly. She continues on the letrozole and fulvestrant, as well as denosumab, again   with excellent tolerance. She has been on Palbociclib and was able to complete approximately 18 days of the 21 day cycle at the 75 mg daily  dose. She then had approximately 3 weeks of neutropenia. She is here today to discuss that further.   REVIEW OF SYSTEMS: Vicki Marshall enjoyed the Thanksgiving holiday. She does have slight taste alteration, but her appetite is "okay" and her weight is stable. She is able to walk 2025 mellitus a day, without shortness of breath or palpitations. She can go up a flight of steps with some shortness of breath, but without having to stop. She has some back pain which is not more intense or persistent than before and which she controls with Tylenol 2 tablets in the morning. He doesn't wake her up at night. Aside from these issues a detailed review of systems today was entirely stable.  PAST MEDICAL HISTORY: Past Medical History  Diagnosis Date  . Urinary tract infection   . Anemia   . Breast cancer     T3N2M1 stage IV invasive ductal carcinoma   . Brain metastases   . Hydronephrosis, left   . Lung metastases   . Bone metastases     PAST SURGICAL HISTORY: Past Surgical History  Procedure Laterality Date  . Cataract extraction  2013  . Mouth surgery  1981    FAMILY HISTORY: The patient's father died at the age of 54, from pneumonia. The patient's mother lived to be 33. The patient had no brothers, one sister. There is no history of breast or ovarian cancer in the family  GYNECOLOGIC HISTORY:  Menarche age 68. The patient is GX P0. She stopped having periods approximately 2003. She did not take hormone replacement. She used birth control pills for approximately 8 years remotely, with no complications   SOCIAL HISTORY:  [name] retired about a year ago. 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 Her husband Vicki Marshall is retired from working in Char[employer] fundraiser. They live alone, with no pets.    ADVANCED DIRECTIVES: Not in place   HEALTH MAINTENANCE: History  Substance Use Topics  . Smoking status: Never Smoker   . Smokeless tobacco: Never Used  . Alcohol Use: Yes      Comment: 10 12 glasses/week     Colonoscopy: Never  PAP: 1990  Bone density: Never  Lipid panel:  No Known Allergies  Current Outpatient Prescriptions  Medication Sig Dispense Refill  . ALPRAZolam (XANAX) 0.5 MG tablet Take 1 tablet (0.5 mg total) by mouth at bedtime as needed for anxiety. 10 tablet 0  . aspirin 81 MG EC tablet Take 81 mg by mouth daily. Swallow whole.    . calcium carbonate (TUMS - DOSED IN MG ELEMENTAL CALCIUM) 500 MG chewable tablet Chew 1 tablet by mouth daily.    Marland Kitchen denosumab (PROLIA) 60 MG/ML SOLN injection Inject 60 mg into the skin every 30 (thirty) days. Administer in upper arm, thigh, or abdomen    . FULVESTRANT IM Inject 500 mg into the muscle.    . letrozole (FEMARA) 2.5 MG tablet Take 1 tablet (2.5 mg total) by mouth daily. 90 tablet 4  . LORazepam (ATIVAN) 0.5 MG tablet     . palbociclib (IBRANCE) 75 MG capsule Take 1 capsule (75 mg total) by mouth daily with breakfast. Take whole with food. 21 capsule 1   No current facility-administered medications for this visit.    OBJECTIVE: Middle-aged white woman in no acute distress Filed Vitals:   05/05/14  1047  BP: 137/77  Pulse: 98  Temp: 97.9 F (36.6 C)  Resp: 18     Body mass index is 18.51 kg/(m^2).    ECOG FS:1 - Symptomatic but completely ambulatory Sclerae unicteric, EOMs intact Oropharynx clear, teeth in good repair No cervical or supraclavicular adenopathy Lungs no rales or rhonchi Heart regular rate and rhythm Abd soft, nontender, positive bowel sounds MSK no focal spinal tenderness, no upper extremity lymphedema Neuro: nonfocal, well oriented, positive affect Breasts: right breast unremarkable. Left breast is photographed below.  05/05/2014      01/06/2014   LAB RESULTS:  CMP     Component Value Date/Time   NA 139 04/21/2014 1517   K 4.9 04/21/2014 1517   CO2 24 04/21/2014 1517   GLUCOSE 87 04/21/2014 1517   BUN 29.6* 04/21/2014 1517   CREATININE 1.3* 04/21/2014 1517    CALCIUM 9.6 04/21/2014 1517   PROT 7.6 04/21/2014 1517   ALBUMIN 3.3* 04/21/2014 1517   AST 60* 04/21/2014 1517   ALT 28 04/21/2014 1517   ALKPHOS 190* 04/21/2014 1517   BILITOT 0.38 04/21/2014 1517    I No results found for: SPEP  Lab Results  Component Value Date   WBC 4.3 05/05/2014   NEUTROABS 2.1 05/05/2014   HGB 8.6* 05/05/2014   HCT 27.4* 05/05/2014   MCV 106.2* 05/05/2014   PLT 291 05/05/2014      Chemistry      Component Value Date/Time   NA 139 04/21/2014 1517   K 4.9 04/21/2014 1517   CO2 24 04/21/2014 1517   BUN 29.6* 04/21/2014 1517   CREATININE 1.3* 04/21/2014 1517      Component Value Date/Time   CALCIUM 9.6 04/21/2014 1517   ALKPHOS 190* 04/21/2014 1517   AST 60* 04/21/2014 1517   ALT 28 04/21/2014 1517   BILITOT 0.38 04/21/2014 1517       No results found for: LABCA2  No components found for: LABCA125  No results for input(s): INR in the last 168 hours.  Urinalysis No results found for: COLORURINE  STUDIES: No results found.  ASSESSMENT: 67 y.o. [place] woman status post left breast in left axillary lymph node biopsy as well as right infraclavicular chest wall biopsy 10/23/2013 for a clinical T3, N2, M1, stage IV invasive ductal carcinoma, grade 2, estrogen receptor 100% positive, progesterone receptor 23% positive, with an MIB-1 of 61%, and no HER-2 amplification.  (1) staging studies 11/05/2013 showed metastatic involvement of bone, liver, lungs, and brain  (2) antiestrogen therapy with letrozole and fulvestrant as well as targeted therapy with palbociclib started 11/11/2013  (3) denosumab monthly started 11/11/2013  (4) s/p stereotactic radiosurgery on 03/19/2014 - Left lateral cerebellar 4 mm to 20 Gy.   - Left medial cerebellar 3 mm to 20 Gy  (5) Right hydronephrosis w/o obstruction-- followed by urology (Dahlstedt)  PLAN: Zykerria is tolerating her systemic therapy generally well. We have not been able to find a dose  of Palbociclib that she could complete 21 days out of. We are going to go with 75 mg every other day. She understands this means she will take the drug on day 1 and day 21, but not on days 2, 4, etc. we are going to continue to follow her counts on a weekly basis for now, but hopefully after this dose decrease we will be able to move her labs to a monthly schedule.  We reviewed her liver function tests today. It is very favorable that her out human   is now above 3. Her alkaline phosphatase is a bit down. Her a ST is stable. Her ALT remains normal. I'm going to obtain a liver MRI at the end of this month to see if we can document a response.  She will be scheduled for a brain MRI at Dr. Manning's discretion, my understanding being this will be in January or February.  With her lab work in a couple of weeks I am adding some anemia tests at to see if we can do something to help her hemoglobin.  Otherwise we are continuing the letrozole, fulvestrant, and denosumab as before. Tyrihanna has a good understanding of the overall plan. She agrees with it. She knows the goal of treatment in her case is control. She will call with any problems that may develop before her next visit here.  MAGRINAT,GUSTAV C, MD 

## 2014-05-06 ENCOUNTER — Other Ambulatory Visit: Payer: Self-pay | Admitting: Oncology

## 2014-05-06 DIAGNOSIS — C50412 Malignant neoplasm of upper-outer quadrant of left female breast: Secondary | ICD-10-CM

## 2014-05-12 ENCOUNTER — Other Ambulatory Visit (HOSPITAL_BASED_OUTPATIENT_CLINIC_OR_DEPARTMENT_OTHER): Payer: Medicare Other

## 2014-05-12 DIAGNOSIS — C50919 Malignant neoplasm of unspecified site of unspecified female breast: Secondary | ICD-10-CM

## 2014-05-12 DIAGNOSIS — C50412 Malignant neoplasm of upper-outer quadrant of left female breast: Secondary | ICD-10-CM

## 2014-05-12 LAB — CBC WITH DIFFERENTIAL/PLATELET
BASO%: 2.2 % — ABNORMAL HIGH (ref 0.0–2.0)
BASOS ABS: 0.1 10*3/uL (ref 0.0–0.1)
EOS ABS: 0.1 10*3/uL (ref 0.0–0.5)
EOS%: 3.3 % (ref 0.0–7.0)
HEMATOCRIT: 27.1 % — AB (ref 34.8–46.6)
HEMOGLOBIN: 8.5 g/dL — AB (ref 11.6–15.9)
LYMPH#: 1.3 10*3/uL (ref 0.9–3.3)
LYMPH%: 36.8 % (ref 14.0–49.7)
MCH: 32.9 pg (ref 25.1–34.0)
MCHC: 31.4 g/dL — ABNORMAL LOW (ref 31.5–36.0)
MCV: 105 fL — ABNORMAL HIGH (ref 79.5–101.0)
MONO#: 0.3 10*3/uL (ref 0.1–0.9)
MONO%: 9.1 % (ref 0.0–14.0)
NEUT%: 48.6 % (ref 38.4–76.8)
NEUTROS ABS: 1.8 10*3/uL (ref 1.5–6.5)
PLATELETS: 251 10*3/uL (ref 145–400)
RBC: 2.58 10*6/uL — ABNORMAL LOW (ref 3.70–5.45)
RDW: 19.8 % — ABNORMAL HIGH (ref 11.2–14.5)
WBC: 3.6 10*3/uL — AB (ref 3.9–10.3)

## 2014-05-15 ENCOUNTER — Telehealth: Payer: Self-pay | Admitting: Oncology

## 2014-05-15 NOTE — Telephone Encounter (Signed)
s.w. pt and advosed on 1.6 appt moved to 1.4 per md ....pt ok adn aware

## 2014-05-15 NOTE — Telephone Encounter (Signed)
No entry 

## 2014-05-26 ENCOUNTER — Other Ambulatory Visit: Payer: Self-pay | Admitting: *Deleted

## 2014-05-26 ENCOUNTER — Ambulatory Visit (HOSPITAL_BASED_OUTPATIENT_CLINIC_OR_DEPARTMENT_OTHER): Payer: Medicare Other | Admitting: Lab

## 2014-05-26 ENCOUNTER — Ambulatory Visit (HOSPITAL_BASED_OUTPATIENT_CLINIC_OR_DEPARTMENT_OTHER): Payer: Medicare Other

## 2014-05-26 DIAGNOSIS — C50412 Malignant neoplasm of upper-outer quadrant of left female breast: Secondary | ICD-10-CM

## 2014-05-26 DIAGNOSIS — C50912 Malignant neoplasm of unspecified site of left female breast: Secondary | ICD-10-CM

## 2014-05-26 DIAGNOSIS — C7951 Secondary malignant neoplasm of bone: Secondary | ICD-10-CM

## 2014-05-26 DIAGNOSIS — C50919 Malignant neoplasm of unspecified site of unspecified female breast: Secondary | ICD-10-CM

## 2014-05-26 DIAGNOSIS — Z5111 Encounter for antineoplastic chemotherapy: Secondary | ICD-10-CM

## 2014-05-26 DIAGNOSIS — D539 Nutritional anemia, unspecified: Secondary | ICD-10-CM

## 2014-05-26 DIAGNOSIS — C7931 Secondary malignant neoplasm of brain: Secondary | ICD-10-CM

## 2014-05-26 LAB — CBC & DIFF AND RETIC
BASO%: 2.2 % — ABNORMAL HIGH (ref 0.0–2.0)
Basophils Absolute: 0.1 10*3/uL (ref 0.0–0.1)
EOS ABS: 0.1 10*3/uL (ref 0.0–0.5)
EOS%: 2.2 % (ref 0.0–7.0)
HCT: 25.3 % — ABNORMAL LOW (ref 34.8–46.6)
HEMOGLOBIN: 8 g/dL — AB (ref 11.6–15.9)
Immature Retic Fract: 19.9 % — ABNORMAL HIGH (ref 1.60–10.00)
LYMPH%: 48 % (ref 14.0–49.7)
MCH: 33.5 pg (ref 25.1–34.0)
MCHC: 31.6 g/dL (ref 31.5–36.0)
MCV: 105.9 fL — AB (ref 79.5–101.0)
MONO#: 0.3 10*3/uL (ref 0.1–0.9)
MONO%: 11.9 % (ref 0.0–14.0)
NEUT%: 35.7 % — ABNORMAL LOW (ref 38.4–76.8)
NEUTROS ABS: 0.8 10*3/uL — AB (ref 1.5–6.5)
PLATELETS: 175 10*3/uL (ref 145–400)
RBC: 2.39 10*6/uL — ABNORMAL LOW (ref 3.70–5.45)
RDW: 20 % — ABNORMAL HIGH (ref 11.2–14.5)
Retic %: 2.43 % — ABNORMAL HIGH (ref 0.70–2.10)
Retic Ct Abs: 58.08 10*3/uL (ref 33.70–90.70)
WBC: 2.3 10*3/uL — ABNORMAL LOW (ref 3.9–10.3)
lymph#: 1.1 10*3/uL (ref 0.9–3.3)

## 2014-05-26 LAB — COMPREHENSIVE METABOLIC PANEL (CC13)
ALBUMIN: 3.2 g/dL — AB (ref 3.5–5.0)
ALT: 48 U/L (ref 0–55)
AST: 101 U/L — AB (ref 5–34)
Alkaline Phosphatase: 203 U/L — ABNORMAL HIGH (ref 40–150)
Anion Gap: 10 mEq/L (ref 3–11)
BUN: 31 mg/dL — ABNORMAL HIGH (ref 7.0–26.0)
CO2: 24 mEq/L (ref 22–29)
Calcium: 9.4 mg/dL (ref 8.4–10.4)
Chloride: 105 mEq/L (ref 98–109)
Creatinine: 1.5 mg/dL — ABNORMAL HIGH (ref 0.6–1.1)
EGFR: 36 mL/min/{1.73_m2} — ABNORMAL LOW (ref >90)
Glucose: 99 mg/dl (ref 70–140)
POTASSIUM: 4.4 meq/L (ref 3.5–5.1)
SODIUM: 139 meq/L (ref 136–145)
TOTAL PROTEIN: 6.9 g/dL (ref 6.4–8.3)
Total Bilirubin: 0.48 mg/dL (ref 0.20–1.20)

## 2014-05-26 MED ORDER — FULVESTRANT 250 MG/5ML IM SOLN
500.0000 mg | Freq: Once | INTRAMUSCULAR | Status: AC
  Administered 2014-05-26: 500 mg via INTRAMUSCULAR
  Filled 2014-05-26: qty 10

## 2014-05-26 MED ORDER — DENOSUMAB 120 MG/1.7ML ~~LOC~~ SOLN
120.0000 mg | Freq: Once | SUBCUTANEOUS | Status: AC
  Administered 2014-05-26: 120 mg via SUBCUTANEOUS
  Filled 2014-05-26: qty 1.7

## 2014-05-26 NOTE — Patient Instructions (Signed)
Denosumab injection What is this medicine? DENOSUMAB (den oh sue mab) slows bone breakdown. Prolia is used to treat osteoporosis in women after menopause and in men. Xgeva is used to prevent bone fractures and other bone problems caused by cancer bone metastases. Xgeva is also used to treat giant cell tumor of the bone. This medicine may be used for other purposes; ask your health care provider or pharmacist if you have questions. COMMON BRAND NAME(S): Prolia, XGEVA What should I tell my health care provider before I take this medicine? They need to know if you have any of these conditions: -dental disease -eczema -infection or history of infections -kidney disease or on dialysis -low blood calcium or vitamin D -malabsorption syndrome -scheduled to have surgery or tooth extraction -taking medicine that contains denosumab -thyroid or parathyroid disease -an unusual reaction to denosumab, other medicines, foods, dyes, or preservatives -pregnant or trying to get pregnant -breast-feeding How should I use this medicine? This medicine is for injection under the skin. It is given by a health care professional in a hospital or clinic setting. If you are getting Prolia, a special MedGuide will be given to you by the pharmacist with each prescription and refill. Be sure to read this information carefully each time. For Prolia, talk to your pediatrician regarding the use of this medicine in children. Special care may be needed. For Xgeva, talk to your pediatrician regarding the use of this medicine in children. While this drug may be prescribed for children as young as 13 years for selected conditions, precautions do apply. Overdosage: If you think you've taken too much of this medicine contact a poison control center or emergency room at once. Overdosage: If you think you have taken too much of this medicine contact a poison control center or emergency room at once. NOTE: This medicine is only for  you. Do not share this medicine with others. What if I miss a dose? It is important not to miss your dose. Call your doctor or health care professional if you are unable to keep an appointment. What may interact with this medicine? Do not take this medicine with any of the following medications: -other medicines containing denosumab This medicine may also interact with the following medications: -medicines that suppress the immune system -medicines that treat cancer -steroid medicines like prednisone or cortisone This list may not describe all possible interactions. Give your health care provider a list of all the medicines, herbs, non-prescription drugs, or dietary supplements you use. Also tell them if you smoke, drink alcohol, or use illegal drugs. Some items may interact with your medicine. What should I watch for while using this medicine? Visit your doctor or health care professional for regular checks on your progress. Your doctor or health care professional may order blood tests and other tests to see how you are doing. Call your doctor or health care professional if you get a cold or other infection while receiving this medicine. Do not treat yourself. This medicine may decrease your body's ability to fight infection. You should make sure you get enough calcium and vitamin D while you are taking this medicine, unless your doctor tells you not to. Discuss the foods you eat and the vitamins you take with your health care professional. See your dentist regularly. Brush and floss your teeth as directed. Before you have any dental work done, tell your dentist you are receiving this medicine. Do not become pregnant while taking this medicine or for 5 months after stopping   it. Women should inform their doctor if they wish to become pregnant or think they might be pregnant. There is a potential for serious side effects to an unborn child. Talk to your health care professional or pharmacist for more  information. What side effects may I notice from receiving this medicine? Side effects that you should report to your doctor or health care professional as soon as possible: -allergic reactions like skin rash, itching or hives, swelling of the face, lips, or tongue -breathing problems -chest pain -fast, irregular heartbeat -feeling faint or lightheaded, falls -fever, chills, or any other sign of infection -muscle spasms, tightening, or twitches -numbness or tingling -skin blisters or bumps, or is dry, peels, or red -slow healing or unexplained pain in the mouth or jaw -unusual bleeding or bruising Side effects that usually do not require medical attention (Report these to your doctor or health care professional if they continue or are bothersome.): -muscle pain -stomach upset, gas This list may not describe all possible side effects. Call your doctor for medical advice about side effects. You may report side effects to FDA at 1-800-FDA-1088. Where should I keep my medicine? This medicine is only given in a clinic, doctor's office, or other health care setting and will not be stored at home. NOTE: This sheet is a summary. It may not cover all possible information. If you have questions about this medicine, talk to your doctor, pharmacist, or health care provider.  2015, Elsevier/Gold Standard. (2011-11-19 12:37:47)  Fulvestrant injection What is this medicine? FULVESTRANT (ful VES trant) blocks the effects of estrogen. It is used to treat breast cancer in women past the age of menopause. This medicine may be used for other purposes; ask your health care provider or pharmacist if you have questions. COMMON BRAND NAME(S): FASLODEX What should I tell my health care provider before I take this medicine? They need to know if you have any of these conditions: -bleeding problems -liver disease -low levels of platelets in the blood -an unusual or allergic reaction to fulvestrant, other  medicines, foods, dyes, or preservatives -pregnant or trying to get pregnant -breast-feeding How should I use this medicine? This medicine is for injection into a muscle. It is usually given by a health care professional in a hospital or clinic setting. Talk to your pediatrician regarding the use of this medicine in children. Special care may be needed. Overdosage: If you think you have taken too much of this medicine contact a poison control center or emergency room at once. NOTE: This medicine is only for you. Do not share this medicine with others. What if I miss a dose? It is important not to miss your dose. Call your doctor or health care professional if you are unable to keep an appointment. What may interact with this medicine? -medicines that treat or prevent blood clots like warfarin, enoxaparin, and dalteparin This list may not describe all possible interactions. Give your health care provider a list of all the medicines, herbs, non-prescription drugs, or dietary supplements you use. Also tell them if you smoke, drink alcohol, or use illegal drugs. Some items may interact with your medicine. What should I watch for while using this medicine? Your condition will be monitored carefully while you are receiving this medicine. You will need important blood work done while you are taking this medicine. Do not become pregnant while taking this medicine. Women should inform their doctor if they wish to become pregnant or think they might be pregnant. There is   a potential for serious side effects to an unborn child. Talk to your health care professional or pharmacist for more information. What side effects may I notice from receiving this medicine? Side effects that you should report to your doctor or health care professional as soon as possible: -allergic reactions like skin rash, itching or hives, swelling of the face, lips, or tongue -feeling faint or lightheaded, falls -fever or flu-like  symptoms -sore throat -vaginal bleeding Side effects that usually do not require medical attention (report to your doctor or health care professional if they continue or are bothersome): -aches, pains -constipation or diarrhea -headache -hot flashes -nausea, vomiting -pain at site where injected -stomach pain This list may not describe all possible side effects. Call your doctor for medical advice about side effects. You may report side effects to FDA at 1-800-FDA-1088. Where should I keep my medicine? This drug is given in a hospital or clinic and will not be stored at home. NOTE: This sheet is a summary. It may not cover all possible information. If you have questions about this medicine, talk to your doctor, pharmacist, or health care provider.  2015, Elsevier/Gold Standard. (2007-09-29 15:39:24)  

## 2014-05-27 LAB — FOLATE RBC: RBC Folate: 734 ng/mL (ref >280)

## 2014-05-27 LAB — VITAMIN B12: Vitamin B-12: 602 pg/mL (ref 211–911)

## 2014-06-02 ENCOUNTER — Other Ambulatory Visit: Payer: Self-pay | Admitting: Oncology

## 2014-06-02 ENCOUNTER — Ambulatory Visit (HOSPITAL_COMMUNITY)
Admission: RE | Admit: 2014-06-02 | Discharge: 2014-06-02 | Disposition: A | Discharge status: Home / Self Care | Payer: Medicare Other | Source: Ambulatory Visit | Attending: Oncology | Admitting: Oncology

## 2014-06-02 DIAGNOSIS — C50412 Malignant neoplasm of upper-outer quadrant of left female breast: Secondary | ICD-10-CM

## 2014-06-02 DIAGNOSIS — C7931 Secondary malignant neoplasm of brain: Secondary | ICD-10-CM

## 2014-06-02 DIAGNOSIS — C7951 Secondary malignant neoplasm of bone: Secondary | ICD-10-CM | POA: Diagnosis not present

## 2014-06-02 DIAGNOSIS — C50912 Malignant neoplasm of unspecified site of left female breast: Secondary | ICD-10-CM

## 2014-06-02 DIAGNOSIS — C50919 Malignant neoplasm of unspecified site of unspecified female breast: Secondary | ICD-10-CM

## 2014-06-06 NOTE — Progress Notes (Signed)
[place]  Telephone:(336) [phone] [phone]) [phone]     ID: Vicki Marshall OB: 12-19-1945  MR#: [id number]  HAL#:[id number]  PCP: Vicki Coma, MD GYN:   SU: Vicki Marshall OTHER MD: Vicki Marshall, Vicki Marshall, Vicki Marshall, Vicki Marshall, Vicki Marshall, Vicki Marshall  CHIEF COMPLAINT: stage IV breast cancer, estrogen receptor positive  TREATMENT: Letrozole, fulvestrant, Palbociclib, and denosumab  BREAST CANCER HISTORY: From the original intake note 11/04/2013:  Vicki Marshall noted a change in her left breast approximately mid 2014. This was a flattening of the lateral aspect of the left breast. Eventually she noted an indentation and eventually a "sore" in that area. She had not shared this information with her husband, and she did not have a primary care physician. More recently, as she started to have symptoms related to this, she mentioned it to her husband Vicki Marshall and she establish yourself with Dr. Stephanie Marshall, who set her up for bilateral diagnostic mammography and ultrasonography at the [facility] 10/23/2013.. This showed a breast density to be category C. The right breast was negative.  The left breast contained an ulcerated mass laterally measuring at least 4 cm mammography. On physical exam there was an ulcerated mass in the 2:00 location of the left breast, associated with erythematous skin nodules in the upper inner quadrant. The left breast was smaller than the right, and firm. There were palpable left axillary lymph nodes. In addition, on the right upper chest wall, infraclavicular like, there was a firm nodule separate from the bony structures. There was also a right preauricular node and a firm left submandibular node.  Ultrasound showed multiple nodules throughout the lower portion of the left breast. There was a large mass in the upper outer quadrant of the left breast contiguous with the skin ulceration, measuring greater than 5 cm. The  left axilla showed at least 3 suspicious masses, the largest measuring 1.9 cm. Ultrasound of the palpable abnormality of the right chest wall showed a superficial irregular mass, subdermal, measuring 0.8 cm.  Biopsies of the left breast mass, a left axillary lymph node, and the right infra-clavicular mass on 10/23/2013, all showed ([id number]) and invasive ductal carcinoma, grade 1 or 2, estrogen receptor 100% positive, progesterone receptor 23% positive, both with strong staining intensity, with an MIB-1 of 61%, and no HER-2 amplification, the signals ratio being 0.90, and the number per cell 1.80.  On 11/02/2013 the patient underwent bilateral breast MRI and this showed, in the left breast, a 5.8 cm mass eroding through the overlying skin, and also invading the underlying pectoralis muscle and chest wall. There were multiple enhancing nodules throughout the left breast and an adjacent 4.8 cm irregular enhancing mass inferiorly. There are multiple large irregular left axillary lymph nodes, the largest measuring 2.4 cm. There was a 0.9 cm left subpectoral lymph node as well as prevascular and precarinal lymph nodes.  In the right breast far upper inner quadrant there was a 0.8 cm irregular enhancing mass abutting the overlying skin. This is the mass that was biopsied and shown to be positive. In addition there was patchy heterogeneous enhancement of the sternum.  The patient's subsequent history is as detailed below  INTERVAL HISTORY: Vicki Marshall for follow-up of her metastatic breast cancer accompanied by her husband, Vicki Marshall. Since her last visit here she had a repeat MRI of the liver, which shows small but measurable increase in her liver lesions. It also shows resolution of her hydronephrosis problem.  REVIEW OF SYSTEMS:  Vicki Marshall complains of fatigue, and part of that may be from the Palbociclib, more likely it is going to be due to her anemia. She feels "achy" here and there, and takes  Tylenol occasionally for that. Her appetite is fair and her weight is fairly stable. She has had no headaches, visual changes, nausea, vomiting or gait imbalance problems. Her left breast continues to shrink. She has occasional back pain. A detailed review of systems was otherwise noncontributory  PAST MEDICAL HISTORY: Past Medical History  Diagnosis Date  . Urinary tract infection   . Anemia   . Breast cancer     T3N2M1 stage IV invasive ductal carcinoma   . Brain metastases   . Hydronephrosis, left   . Lung metastases   . Bone metastases     PAST SURGICAL HISTORY: Past Surgical History  Procedure Laterality Date  . Cataract extraction  2013  . Mouth surgery  1981    FAMILY HISTORY: The patient's father died at the age of 34, from pneumonia. The patient's mother lived to be 60. The patient had no brothers, one sister. There is no history of breast or ovarian cancer in the family  GYNECOLOGIC HISTORY:  Menarche age 39. The patient is GX P0. She stopped having periods approximately 2003. She did not take hormone replacement. She used birth control pills for approximately 8 years remotely, with no complications   SOCIAL HISTORY:  Vicki Marshall retired about a year ago. 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 Her husband Vicki Marshall is retired from working in Char[employer] fundraiser. They live alone, with no pets.    ADVANCED DIRECTIVES: Not in place   HEALTH MAINTENANCE: History  Substance Use Topics  . Smoking status: Never Smoker   . Smokeless tobacco: Never Used  . Alcohol Use: Yes     Comment: 10 12 glasses/week     Colonoscopy: Never  PAP: 1990  Bone density: Never  Lipid panel:  No Known Allergies  Current Outpatient Prescriptions  Medication Sig Dispense Refill  . ALPRAZolam (XANAX) 0.5 MG tablet Take 1 tablet (0.5 mg total) by mouth at bedtime as needed for anxiety. 10 tablet 0  . aspirin 81 MG EC tablet Take 81 mg by mouth daily. Swallow whole.    .  calcium carbonate (TUMS - DOSED IN MG ELEMENTAL CALCIUM) 500 MG chewable tablet Chew 1 tablet by mouth daily.    Marland Kitchen denosumab (PROLIA) 60 MG/ML SOLN injection Inject 60 mg into the skin every 30 (thirty) days. Administer in upper arm, thigh, or abdomen    . everolimus (AFINITOR) 10 MG tablet Take 1 tablet (10 mg total) by mouth daily. 30 tablet 12  . exemestane (AROMASIN) 25 MG tablet Take 1 tablet (25 mg total) by mouth daily after breakfast. 30 tablet 12  . LORazepam (ATIVAN) 0.5 MG tablet      No current facility-administered medications for this visit.    OBJECTIVE: Middle-aged white woman who appears stated age 69 Vitals:   06/07/14 1017  BP: 138/78  Pulse: 99  Temp: 97.5 F (36.4 C)  Resp: 18     Body mass index is 18.34 kg/(m^2).    ECOG FS:1 - Symptomatic but completely ambulatory  Sclerae unicteric, pupils round and equal Oropharynx clear, full upper plate No cervical or supraclavicular adenopathy Lungs no rales or rhonchi Heart regular rate and rhythm Abd soft, nontender, positive bowel sounds MSK mild kyphosis but no focal spinal tenderness, minimal swelling in the dorsum of the left  wrist, no erythema Neuro: nonfocal, well oriented, appropriate affect Breasts: right breast unremarkable. Left breast is photographed below.      LAB RESULTS:  CMP     Component Value Date/Time   NA 139 05/26/2014 1522   K 4.4 05/26/2014 1522   CO2 24 05/26/2014 1522   GLUCOSE 99 05/26/2014 1522   BUN 31.0* 05/26/2014 1522   CREATININE 1.5* 05/26/2014 1522   CALCIUM 9.4 05/26/2014 1522   PROT 6.9 05/26/2014 1522   ALBUMIN 3.2* 05/26/2014 1522   AST 101* 05/26/2014 1522   ALT 48 05/26/2014 1522   ALKPHOS 203* 05/26/2014 1522   BILITOT 0.48 05/26/2014 1522    I No results found for: SPEP  Lab Results  Component Value Date   WBC 3.5* 06/07/2014   NEUTROABS 1.4* 06/07/2014   HGB 9.3* 06/07/2014   HCT 29.2* 06/07/2014   MCV 105.8* 06/07/2014   PLT 168 06/07/2014       Chemistry      Component Value Date/Time   NA 139 05/26/2014 1522   K 4.4 05/26/2014 1522   CO2 24 05/26/2014 1522   BUN 31.0* 05/26/2014 1522   CREATININE 1.5* 05/26/2014 1522      Component Value Date/Time   CALCIUM 9.4 05/26/2014 1522   ALKPHOS 203* 05/26/2014 1522   AST 101* 05/26/2014 1522   ALT 48 05/26/2014 1522   BILITOT 0.48 05/26/2014 1522       No results found for: LABCA2  No components found for: [id number]  No results for input(s): INR in the last 168 hours.  Urinalysis No results found for: COLORURINE  STUDIES: Mr Abdomen Wo Contrast  06/09/14   CLINICAL DATA:  Followup metastatic left breast carcinoma. Metastases to liver, bone, and brain. Renal insufficiency.  EXAM: MRI ABDOMEN WITHOUT CONTRAST  TECHNIQUE: Multiplanar multisequence MR imaging was performed without the administration of intravenous contrast.  COMPARISON:  02/18/2014  FINDINGS: Lower chest:  Unremarkable.  Hepatobiliary: Further progression of diffuse liver metastases is seen compared to previous study. Index lesion in the left hepatic lobe on series 3/image 28 currently measures 2.4 x 2.9 cm compared to 2.0 x 2.4 cm previously. Index lesion in the right hepatic lobe on series 3/ image 22 currently measures 2.9 x 3.0 cm compared to 1.9 x 1.6 cm previously.  Pancreas: No mass, inflammatory changes, or other parenchymal abnormality identified.  Spleen:  Within normal limits in size and appearance.  Adrenal Glands:  No mass identified.  Kidneys: No masses identified. Right-sided hydronephrosis has resolved since previous study.  Stomach/Bowel/Peritoneum: Visualized portions within the abdomen are unremarkable.  Vascular/Lymphatic: No pathologically enlarged lymph nodes identified. No other significant abnormality noted.  Other:  None.  Musculoskeletal: Diffuse bone metastases remain similar in appearance to prior exam.  IMPRESSION: Further interval progression of diffuse liver metastases.  Interval  resolution of right-sided hydronephrosis.  Diffuse bone metastases again demonstrated.   Electronically Signed   By: Earle Gell M.D.   On: June 09, 2014 11:23    ASSESSMENT: 69 y.o. [place] woman status post left breast and left axillary lymph node biopsy as well as right infraclavicular chest wall biopsy 10/23/2013 for a clinical T3, N2, M1, stage IV invasive ductal carcinoma, grade 2, estrogen receptor 100% positive, progesterone receptor 23% positive, with an MIB-1 of 61%, and no HER-2 amplification.  (1) staging studies 11/05/2013 showed metastatic involvement of bone, liver, lungs, and brain  (2) antiestrogen therapy with letrozole and fulvestrant as well as targeted therapy with palbociclib started 11/11/2013, discontinued  January 2015 with progression  (3) denosumab monthly started 11/11/2013  (4) s/p stereotactic radiosurgery to brain metastases 03/19/2014 - Left lateral cerebellar 4 mm to 20 Gy.   - Left medial cerebellar 3 mm to 20 Gy  (5) Right hydronephrosis w/o obstruction-- followed by urology ([facility]t)--resolved as of abd MRI 06/02/2014  (6) exemestane and everolimus started 06/07/2014  PLAN: I spent approximately one hour Marshall with Crosland Sauer and her husband going over her situation. Maxene had an initial response to her current treatment with Palbociclib, fulvestrant, and letrozole, but the cancer is now growing, even though minimally, still measurably, in the liver.  Accordingly Marshall we are stopping the Palbociclib, letrozole and fulvestrant. We went through the usual 3 questions we asked at this point, the first being whether the patient's condition warrants further treatment, and the answer in her case is clearly yes.  The second question is whether we should continue with anti-estrogens or switch to chemotherapy. We do not have an impending visceral crisis, and so we can go with a different set of anti-estrogens and that is the choice. However if these do not  work, the next step for her peripheral disease treatment will be chemotherapy  Marshall we discussed everolimus and exemestane in detail. She has a good understanding of the possible toxicities, side effects and complications of those 2 agents. I went ahead and placed the prescriptions and I am hopeful she will be able to obtain both medications sometime this week. She will start them as she receives them: She does not need to start them concurrently.  I am going to see her again late February or early March. By then we should know how she is tolerating these treatments.  I also discussed her situation with the brain team. They are planning to do a brain MRI sometime this month. If she needs further stereotactic radiotherapy there is no need to stop or interrupt her antiestrogen treatment or her denosumab, which also will continue  Kent has a good understanding of the overall plan. She agrees with it. She knows the goal of treatment in her case is control. She will call with any problems that may develop before her next visit here.  Chauncey Cruel, MD

## 2014-06-07 ENCOUNTER — Encounter: Payer: Self-pay | Admitting: Oncology

## 2014-06-07 ENCOUNTER — Other Ambulatory Visit (HOSPITAL_BASED_OUTPATIENT_CLINIC_OR_DEPARTMENT_OTHER): Payer: Medicare Other

## 2014-06-07 ENCOUNTER — Other Ambulatory Visit: Payer: Self-pay | Admitting: Radiation Therapy

## 2014-06-07 ENCOUNTER — Ambulatory Visit (HOSPITAL_BASED_OUTPATIENT_CLINIC_OR_DEPARTMENT_OTHER): Payer: Medicare Other | Admitting: Oncology

## 2014-06-07 VITALS — BP 138/78 | HR 99 | Temp 97.5°F | Resp 18 | Ht 66.0 in | Wt 113.6 lb

## 2014-06-07 DIAGNOSIS — C7951 Secondary malignant neoplasm of bone: Secondary | ICD-10-CM

## 2014-06-07 DIAGNOSIS — C50412 Malignant neoplasm of upper-outer quadrant of left female breast: Secondary | ICD-10-CM

## 2014-06-07 DIAGNOSIS — D630 Anemia in neoplastic disease: Secondary | ICD-10-CM

## 2014-06-07 DIAGNOSIS — C7931 Secondary malignant neoplasm of brain: Secondary | ICD-10-CM

## 2014-06-07 DIAGNOSIS — C50912 Malignant neoplasm of unspecified site of left female breast: Secondary | ICD-10-CM

## 2014-06-07 DIAGNOSIS — C50919 Malignant neoplasm of unspecified site of unspecified female breast: Secondary | ICD-10-CM

## 2014-06-07 DIAGNOSIS — N1330 Unspecified hydronephrosis: Secondary | ICD-10-CM

## 2014-06-07 LAB — CBC WITH DIFFERENTIAL/PLATELET
BASO%: 2.9 % — ABNORMAL HIGH (ref 0.0–2.0)
Basophils Absolute: 0.1 10*3/uL (ref 0.0–0.1)
EOS%: 1.4 % (ref 0.0–7.0)
Eosinophils Absolute: 0.1 10*3/uL (ref 0.0–0.5)
HCT: 29.2 % — ABNORMAL LOW (ref 34.8–46.6)
HGB: 9.3 g/dL — ABNORMAL LOW (ref 11.6–15.9)
LYMPH%: 28.5 % (ref 14.0–49.7)
MCH: 33.7 pg (ref 25.1–34.0)
MCHC: 31.8 g/dL (ref 31.5–36.0)
MCV: 105.8 fL — AB (ref 79.5–101.0)
MONO#: 1 10*3/uL — ABNORMAL HIGH (ref 0.1–0.9)
MONO%: 28 % — ABNORMAL HIGH (ref 0.0–14.0)
NEUT#: 1.4 10*3/uL — ABNORMAL LOW (ref 1.5–6.5)
NEUT%: 39.2 % (ref 38.4–76.8)
PLATELETS: 168 10*3/uL (ref 145–400)
RBC: 2.76 10*6/uL — AB (ref 3.70–5.45)
RDW: 20 % — ABNORMAL HIGH (ref 11.2–14.5)
WBC: 3.5 10*3/uL — ABNORMAL LOW (ref 3.9–10.3)
lymph#: 1 10*3/uL (ref 0.9–3.3)
nRBC: 1 % — ABNORMAL HIGH (ref 0–0)

## 2014-06-07 MED ORDER — EXEMESTANE 25 MG PO TABS: 25.0000 mg | ORAL_TABLET | Freq: Every day | ORAL | Status: DC

## 2014-06-07 MED ORDER — EVEROLIMUS 10 MG PO TABS: 10.0000 mg | ORAL_TABLET | Freq: Every day | ORAL | Status: DC

## 2014-06-07 NOTE — Progress Notes (Signed)
[employer] approved afinitor from 06/07/14-06/08/15

## 2014-06-09 ENCOUNTER — Other Ambulatory Visit: Payer: Medicare Other

## 2014-06-09 ENCOUNTER — Ambulatory Visit: Payer: Medicare Other | Admitting: Oncology

## 2014-06-09 ENCOUNTER — Telehealth: Payer: Self-pay | Admitting: Oncology

## 2014-06-09 ENCOUNTER — Encounter: Payer: Self-pay | Admitting: *Deleted

## 2014-06-09 NOTE — Telephone Encounter (Signed)
per pof to sch pt appt-per GM ok to sch in 4:30 slot on 3/1-pt to get updated copy of sch 1/20

## 2014-06-09 NOTE — Progress Notes (Signed)
Prescription for afinitor 10mg   faxed to [facility].

## 2014-06-11 ENCOUNTER — Encounter: Payer: Self-pay | Admitting: Oncology

## 2014-06-11 NOTE — Progress Notes (Signed)
Per Lindaann Pascal, Afinitor approved 09/03/13-12/02/2014--balance today [phone] [id number]

## 2014-06-23 ENCOUNTER — Other Ambulatory Visit (HOSPITAL_BASED_OUTPATIENT_CLINIC_OR_DEPARTMENT_OTHER): Payer: Medicare Other

## 2014-06-23 ENCOUNTER — Ambulatory Visit (HOSPITAL_BASED_OUTPATIENT_CLINIC_OR_DEPARTMENT_OTHER): Payer: Medicare Other

## 2014-06-23 DIAGNOSIS — C50412 Malignant neoplasm of upper-outer quadrant of left female breast: Secondary | ICD-10-CM

## 2014-06-23 DIAGNOSIS — C50919 Malignant neoplasm of unspecified site of unspecified female breast: Secondary | ICD-10-CM

## 2014-06-23 DIAGNOSIS — C7951 Secondary malignant neoplasm of bone: Secondary | ICD-10-CM

## 2014-06-23 DIAGNOSIS — N1330 Unspecified hydronephrosis: Secondary | ICD-10-CM

## 2014-06-23 DIAGNOSIS — C7931 Secondary malignant neoplasm of brain: Secondary | ICD-10-CM

## 2014-06-23 DIAGNOSIS — C50912 Malignant neoplasm of unspecified site of left female breast: Secondary | ICD-10-CM

## 2014-06-23 LAB — COMPREHENSIVE METABOLIC PANEL (CC13)
ALBUMIN: 2.9 g/dL — AB (ref 3.5–5.0)
ALT: 55 U/L (ref 0–55)
AST: 119 U/L — ABNORMAL HIGH (ref 5–34)
Alkaline Phosphatase: 206 U/L — ABNORMAL HIGH (ref 40–150)
Anion Gap: 12 mEq/L — ABNORMAL HIGH (ref 3–11)
BILIRUBIN TOTAL: 0.38 mg/dL (ref 0.20–1.20)
BUN: 25.3 mg/dL (ref 7.0–26.0)
CO2: 24 mEq/L (ref 22–29)
Calcium: 9 mg/dL (ref 8.4–10.4)
Chloride: 107 mEq/L (ref 98–109)
Creatinine: 1.3 mg/dL — ABNORMAL HIGH (ref 0.6–1.1)
EGFR: 44 mL/min/{1.73_m2} — ABNORMAL LOW (ref >90)
Glucose: 127 mg/dl (ref 70–140)
POTASSIUM: 4.3 meq/L (ref 3.5–5.1)
Sodium: 142 mEq/L (ref 136–145)
Total Protein: 7 g/dL (ref 6.4–8.3)

## 2014-06-23 LAB — CBC WITH DIFFERENTIAL/PLATELET
BASO%: 1.6 % (ref 0.0–2.0)
BASOS ABS: 0 10*3/uL (ref 0.0–0.1)
EOS%: 5.2 % (ref 0.0–7.0)
Eosinophils Absolute: 0.1 10*3/uL (ref 0.0–0.5)
HEMATOCRIT: 27.8 % — AB (ref 34.8–46.6)
HGB: 8.7 g/dL — ABNORMAL LOW (ref 11.6–15.9)
LYMPH%: 32 % (ref 14.0–49.7)
MCH: 32.9 pg (ref 25.1–34.0)
MCHC: 31.2 g/dL — ABNORMAL LOW (ref 31.5–36.0)
MCV: 105.6 fL — ABNORMAL HIGH (ref 79.5–101.0)
MONO#: 0.4 10*3/uL (ref 0.1–0.9)
MONO%: 16.4 % — AB (ref 0.0–14.0)
NEUT#: 1 10*3/uL — ABNORMAL LOW (ref 1.5–6.5)
NEUT%: 44.8 % (ref 38.4–76.8)
PLATELETS: 131 10*3/uL — AB (ref 145–400)
RBC: 2.63 10*6/uL — ABNORMAL LOW (ref 3.70–5.45)
RDW: 20 % — ABNORMAL HIGH (ref 11.2–14.5)
WBC: 2.2 10*3/uL — ABNORMAL LOW (ref 3.9–10.3)
lymph#: 0.7 10*3/uL — ABNORMAL LOW (ref 0.9–3.3)

## 2014-06-23 MED ORDER — DENOSUMAB 120 MG/1.7ML ~~LOC~~ SOLN
120.0000 mg | Freq: Once | SUBCUTANEOUS | Status: AC
  Administered 2014-06-23: 120 mg via SUBCUTANEOUS
  Filled 2014-06-23: qty 1.7

## 2014-06-23 NOTE — Patient Instructions (Signed)
Denosumab injection What is this medicine? DENOSUMAB (den oh sue mab) slows bone breakdown. Prolia is used to treat osteoporosis in women after menopause and in men. Xgeva is used to prevent bone fractures and other bone problems caused by cancer bone metastases. Xgeva is also used to treat giant cell tumor of the bone. This medicine may be used for other purposes; ask your health care provider or pharmacist if you have questions. COMMON BRAND NAME(S): Prolia, XGEVA What should I tell my health care provider before I take this medicine? They need to know if you have any of these conditions: -dental disease -eczema -infection or history of infections -kidney disease or on dialysis -low blood calcium or vitamin D -malabsorption syndrome -scheduled to have surgery or tooth extraction -taking medicine that contains denosumab -thyroid or parathyroid disease -an unusual reaction to denosumab, other medicines, foods, dyes, or preservatives -pregnant or trying to get pregnant -breast-feeding How should I use this medicine? This medicine is for injection under the skin. It is given by a health care professional in a hospital or clinic setting. If you are getting Prolia, a special MedGuide will be given to you by the pharmacist with each prescription and refill. Be sure to read this information carefully each time. For Prolia, talk to your pediatrician regarding the use of this medicine in children. Special care may be needed. For Xgeva, talk to your pediatrician regarding the use of this medicine in children. While this drug may be prescribed for children as young as 13 years for selected conditions, precautions do apply. Overdosage: If you think you've taken too much of this medicine contact a poison control center or emergency room at once. Overdosage: If you think you have taken too much of this medicine contact a poison control center or emergency room at once. NOTE: This medicine is only for  you. Do not share this medicine with others. What if I miss a dose? It is important not to miss your dose. Call your doctor or health care professional if you are unable to keep an appointment. What may interact with this medicine? Do not take this medicine with any of the following medications: -other medicines containing denosumab This medicine may also interact with the following medications: -medicines that suppress the immune system -medicines that treat cancer -steroid medicines like prednisone or cortisone This list may not describe all possible interactions. Give your health care provider a list of all the medicines, herbs, non-prescription drugs, or dietary supplements you use. Also tell them if you smoke, drink alcohol, or use illegal drugs. Some items may interact with your medicine. What should I watch for while using this medicine? Visit your doctor or health care professional for regular checks on your progress. Your doctor or health care professional may order blood tests and other tests to see how you are doing. Call your doctor or health care professional if you get a cold or other infection while receiving this medicine. Do not treat yourself. This medicine may decrease your body's ability to fight infection. You should make sure you get enough calcium and vitamin D while you are taking this medicine, unless your doctor tells you not to. Discuss the foods you eat and the vitamins you take with your health care professional. See your dentist regularly. Brush and floss your teeth as directed. Before you have any dental work done, tell your dentist you are receiving this medicine. Do not become pregnant while taking this medicine or for 5 months after stopping   it. Women should inform their doctor if they wish to become pregnant or think they might be pregnant. There is a potential for serious side effects to an unborn child. Talk to your health care professional or pharmacist for more  information. What side effects may I notice from receiving this medicine? Side effects that you should report to your doctor or health care professional as soon as possible: -allergic reactions like skin rash, itching or hives, swelling of the face, lips, or tongue -breathing problems -chest pain -fast, irregular heartbeat -feeling faint or lightheaded, falls -fever, chills, or any other sign of infection -muscle spasms, tightening, or twitches -numbness or tingling -skin blisters or bumps, or is dry, peels, or red -slow healing or unexplained pain in the mouth or jaw -unusual bleeding or bruising Side effects that usually do not require medical attention (Report these to your doctor or health care professional if they continue or are bothersome.): -muscle pain -stomach upset, gas This list may not describe all possible side effects. Call your doctor for medical advice about side effects. You may report side effects to FDA at 1-800-FDA-1088. Where should I keep my medicine? This medicine is only given in a clinic, doctor's office, or other health care setting and will not be stored at home. NOTE: This sheet is a summary. It may not cover all possible information. If you have questions about this medicine, talk to your doctor, pharmacist, or health care provider.  2015, Elsevier/Gold Standard. (2011-11-19 12:37:47)  

## 2014-06-28 ENCOUNTER — Ambulatory Visit
Admission: RE | Admit: 2014-06-28 | Discharge: 2014-06-28 | Disposition: A | Discharge status: Home / Self Care | Payer: Medicare Other | Source: Ambulatory Visit | Attending: Radiation Oncology | Admitting: Radiation Oncology

## 2014-06-28 DIAGNOSIS — C7931 Secondary malignant neoplasm of brain: Secondary | ICD-10-CM

## 2014-06-28 MED ORDER — GADOBENATE DIMEGLUMINE 529 MG/ML IV SOLN
6.0000 mL | Freq: Once | INTRAVENOUS | Status: AC | PRN
  Administered 2014-06-28: 6 mL via INTRAVENOUS

## 2014-06-28 NOTE — Progress Notes (Signed)
Quick Note:  Susan, please set-up SRS ______ 

## 2014-06-28 NOTE — Progress Notes (Signed)
Radiation Oncology         [phone]   Name: Vicki Marshall   Date: 06/30/2014   MRN: [id number]  DOB: December 26, 1945    Multidisciplinary Brain and Spine Oncology Clinic Follow-Up Visit Note  CC: Lilian Coma, MD  Lilian Coma, MD    ICD-9-CM ICD-10-CM   1. 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   - Left medial cerebellar 3 mm to 20 Gy  Interval Since Last Radiation:  3  months  Narrative:  The patient returns today for routine follow-up.  The recent films were presented in our multidisciplinary conference with neuroradiology just prior to the clinic.  She is asymptomatic.                              ALLERGIES:  has No Known Allergies.  Meds: Current Outpatient Prescriptions  Medication Sig Dispense Refill  . ALPRAZolam (XANAX) 0.5 MG tablet Take 1 tablet (0.5 mg total) by mouth at bedtime as needed for anxiety. 10 tablet 0  . aspirin 81 MG EC tablet Take 81 mg by mouth daily. Swallow whole.    . calcium carbonate (TUMS - DOSED IN MG ELEMENTAL CALCIUM) 500 MG chewable tablet Chew 1 tablet by mouth daily.    Marland Kitchen denosumab (PROLIA) 60 MG/ML SOLN injection Inject 60 mg into the skin every 30 (thirty) days. Administer in upper arm, thigh, or abdomen    . everolimus (AFINITOR) 10 MG tablet Take 1 tablet (10 mg total) by mouth daily. 30 tablet 12  . exemestane (AROMASIN) 25 MG tablet Take 1 tablet (25 mg total) by mouth daily after breakfast. 30 tablet 12  . LORazepam (ATIVAN) 0.5 MG tablet      No current facility-administered medications for this encounter.    Physical Findings: The patient is in no acute distress. Patient is alert and oriented.  vitals were not taken for this visit..  No significant  changes.  Lab Findings: Lab Results  Component Value Date   WBC 2.2* 06/23/2014   HGB 8.7* 06/23/2014   HCT 27.8* 06/23/2014   MCV 105.6* 06/23/2014   PLT 131* 06/23/2014    @LASTCHEM @  Radiographic Findings: Mr Kizzie Fantasia Contrast  06/28/2014   CLINICAL DATA:  Left upper outer quadrant breast cancer with brain metastases. Ongoing chemotherapy. Stereotactic radiosurgery for brain metastases performed 03/19/2014.  EXAM: MRI HEAD WITHOUT AND WITH CONTRAST  TECHNIQUE: Multiplanar, multiecho pulse sequences of the brain and surrounding structures were obtained without and with intravenous contrast.  CONTRAST:  60m MULTIHANCE GADOBENATE DIMEGLUMINE 529 MG/ML IV SOLN  COMPARISON:  02/19/2014  FINDINGS: Diffusely abnormal bone marrow signal is again seen involving the skull and visualized upper cervical spine consistent with known widespread osseous metastases.  There is no evidence of acute infarct, intracranial hemorrhage, midline shift, or extra-axial fluid collection. There is mild cerebral atrophy. Small foci of T2 hyperintensity in the cerebral white matter bilaterally are similar to the prior study and nonspecific but compatible with mild chronic small vessel ischemic disease. A small right frontal developmental venous anomaly is again seen.  The 4 mm enhancing lesion in the left  cerebellum has not significantly changed in size (series 10, image 28). The smaller lesion posterior to that on the prior study is no longer clearly identified.  Two new, small right parietal cortical enhancing lesions measure 4 mm (series 11, image 9 and series 10, image 93) and 2 mm (series 10, image 96 and series 12, image 13).  Prior right cataract extraction is noted. Paranasal sinuses and mastoid air cells are clear. Major intracranial vascular flow voids are preserved.  IMPRESSION: 1. Unchanged 4 mm left cerebellar lesion. 3 mm left cerebellar lesion on the prior study is no longer present. 2. 2 new right parietal  metastases measuring 2 mm and 4 mm.   Electronically Signed   By: Logan Bores   On: 06/28/2014 14:45   Mr Abdomen Wo Contrast  06/02/2014   CLINICAL DATA:  Followup metastatic left breast carcinoma. Metastases to liver, bone, and brain. Renal insufficiency.  EXAM: MRI ABDOMEN WITHOUT CONTRAST  TECHNIQUE: Multiplanar multisequence MR imaging was performed without the administration of intravenous contrast.  COMPARISON:  02/18/2014  FINDINGS: Lower chest:  Unremarkable.  Hepatobiliary: Further progression of diffuse liver metastases is seen compared to previous study. Index lesion in the left hepatic lobe on series 3/image 28 currently measures 2.4 x 2.9 cm compared to 2.0 x 2.4 cm previously. Index lesion in the right hepatic lobe on series 3/ image 22 currently measures 2.9 x 3.0 cm compared to 1.9 x 1.6 cm previously.  Pancreas: No mass, inflammatory changes, or other parenchymal abnormality identified.  Spleen:  Within normal limits in size and appearance.  Adrenal Glands:  No mass identified.  Kidneys: No masses identified. Right-sided hydronephrosis has resolved since previous study.  Stomach/Bowel/Peritoneum: Visualized portions within the abdomen are unremarkable.  Vascular/Lymphatic: No pathologically enlarged lymph nodes identified. No other significant abnormality noted.  Other:  None.  Musculoskeletal: Diffuse bone metastases remain similar in appearance to prior exam.  IMPRESSION: Further interval progression of diffuse liver metastases.  Interval resolution of right-sided hydronephrosis.  Diffuse bone metastases again demonstrated.   Electronically Signed   By: Earle Gell M.D.   On: 06/02/2014 11:23    Impression:  The patient is recovering from the effects of radiation.  She has two new subcentimeter brain metastases.  Treated cerebellar lesions show no evidence of recurrence or progression.  Plan:  SRS ot two new brain metastases.  _____________________________________  Sheral Apley  Tammi Klippel, M.D.

## 2014-06-30 ENCOUNTER — Ambulatory Visit
Admission: RE | Admit: 2014-06-30 | Discharge: 2014-06-30 | Disposition: A | Discharge status: Home / Self Care | Payer: Medicare Other | Source: Ambulatory Visit | Attending: Radiation Oncology | Admitting: Radiation Oncology

## 2014-06-30 DIAGNOSIS — C7931 Secondary malignant neoplasm of brain: Principal | ICD-10-CM

## 2014-06-30 DIAGNOSIS — C50912 Malignant neoplasm of unspecified site of left female breast: Secondary | ICD-10-CM

## 2014-06-30 NOTE — Addendum Note (Signed)
Encounter addended by: Lora Paula, MD on: 06/30/2014  4:10 PM<BR>     Documentation filed: Follow-up Section, LOS Section

## 2014-07-01 ENCOUNTER — Ambulatory Visit
Admission: RE | Admit: 2014-07-01 | Discharge: 2014-07-01 | Disposition: A | Discharge status: Home / Self Care | Payer: Medicare Other | Source: Ambulatory Visit | Attending: Radiation Oncology | Admitting: Radiation Oncology

## 2014-07-01 ENCOUNTER — Ambulatory Visit: Payer: Medicare Other

## 2014-07-01 DIAGNOSIS — C7931 Secondary malignant neoplasm of brain: Secondary | ICD-10-CM | POA: Insufficient documentation

## 2014-07-01 DIAGNOSIS — Z170 Estrogen receptor positive status [ER+]: Secondary | ICD-10-CM | POA: Insufficient documentation

## 2014-07-01 DIAGNOSIS — Z510 Encounter for antineoplastic radiation therapy: Secondary | ICD-10-CM | POA: Insufficient documentation

## 2014-07-01 DIAGNOSIS — C50411 Malignant neoplasm of upper-outer quadrant of right female breast: Secondary | ICD-10-CM | POA: Insufficient documentation

## 2014-07-01 DIAGNOSIS — C50919 Malignant neoplasm of unspecified site of unspecified female breast: Secondary | ICD-10-CM

## 2014-07-01 NOTE — Progress Notes (Signed)
  Radiation Oncology         [phone] ________________________________  Name: Vicki Marshall MRN: [id number]  Date: 07/01/2014  DOB: 08-29-1945         SIMULATION AND TREATMENT PLANNING NOTE    ICD-9-CM ICD-10-CM   1. 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  Identity was confirmed.  All relevant records and images related to the planned course of therapy were reviewed.  The patient freely provided informed written consent to proceed with treatment after reviewing the details related to the planned course of therapy. The consent form was witnessed and verified by the simulation staff. Intravenous access was established for contrast administration. Then, the patient was set-up in a stable reproducible supine position for radiation therapy.  A relocatable thermoplastic stereotactic head frame was fabricated for precise immobilization.  CT images were obtained.  Surface markings were placed.  The CT images were loaded into the planning software and fused with the patient's targeting MRI scan.  Then the target and avoidance structures were contoured.  Treatment planning then occurred.  The radiation prescription was entered and confirmed.  I have requested 3D planning  I have requested a DVH of the following structures: Brain stem, brain, left eye, right eye, lenses, optic chiasm, target volumes, uninvolved brain, and normal tissue.    PLAN:  The patient will receive 20 Gy in 1 fraction to her two new right parietal brain metastases.  ________________________________  Sheral Apley Tammi Klippel, M.D.

## 2014-07-02 DIAGNOSIS — Z510 Encounter for antineoplastic radiation therapy: Secondary | ICD-10-CM | POA: Diagnosis not present

## 2014-07-04 ENCOUNTER — Ambulatory Visit
Admission: RE | Admit: 2014-07-04 | Discharge: 2014-07-04 | Disposition: A | Discharge status: Home / Self Care | Payer: Medicare Other | Source: Ambulatory Visit | Attending: Radiation Oncology | Admitting: Radiation Oncology

## 2014-07-05 ENCOUNTER — Encounter: Payer: Self-pay | Admitting: Radiation Oncology

## 2014-07-05 ENCOUNTER — Ambulatory Visit
Admission: RE | Admit: 2014-07-05 | Discharge: 2014-07-05 | Disposition: A | Discharge status: Home / Self Care | Payer: Medicare Other | Source: Ambulatory Visit | Attending: Radiation Oncology | Admitting: Radiation Oncology

## 2014-07-05 VITALS — BP 123/72 | HR 73 | Temp 97.4°F | Resp 16

## 2014-07-05 DIAGNOSIS — C50919 Malignant neoplasm of unspecified site of unspecified female breast: Secondary | ICD-10-CM

## 2014-07-05 DIAGNOSIS — Z510 Encounter for antineoplastic radiation therapy: Secondary | ICD-10-CM | POA: Diagnosis not present

## 2014-07-05 DIAGNOSIS — C7931 Secondary malignant neoplasm of brain: Principal | ICD-10-CM

## 2014-07-05 NOTE — Progress Notes (Signed)
  Radiation Oncology         [phone] ________________________________  Stereotactic Treatment Procedure Note  Name: Vicki Marshall MRN: [id number]  Date: 07/05/2014  DOB: 01/15/1946  SPECIAL TREATMENT PROCEDURE    ICD-9-CM ICD-10-CM   1. Breast cancer metastasized to brain, unspecified laterality 174.9 C50.919    198.3 C79.31     3D TREATMENT PLANNING AND DOSIMETRY:  The patient's radiation plan was reviewed and approved by neurosurgery and radiation oncology prior to treatment.  It showed 3-dimensional radiation distributions overlaid onto the planning CT/MRI image set.  The [facility] for the target structures as well as the organs at risk were reviewed. The documentation of the 3D plan and dosimetry are filed in the radiation oncology EMR.  NARRATIVE:  Vicki Marshall was brought to the TrueBeam stereotactic radiation treatment machine and placed supine on the CT couch. The head frame was applied, and the patient was set up for stereotactic radiosurgery.  Neurosurgery was present for the set-up and delivery  SIMULATION VERIFICATION:  In the couch zero-angle position, the patient underwent Exactrac imaging using the Brainlab system with orthogonal KV images.  These were carefully aligned and repeated to confirm treatment position for each of the isocenters.  The Exactrac snap film verification was repeated at each couch angle.  SPECIAL TREATMENT PROCEDURE: Vicki Marshall received stereotactic radiosurgery to the following targets: Right parietal 3 mm target was treated using 3 Circular Arcs to a prescription dose of 20 Gy.  ExacTrac registration was performed for each couch angle.  The 82.6% isodose line was prescribed.  6 MV X-rays were delivered in the flattening filter free beam mode.  The 7.5 mm circular collimator was used for all 3 arcs. Right parietal 2 mm target was treated using 3 Circular Arcs to a prescription dose of 20 Gy.  ExacTrac registration was performed for each couch  angle.  The 79.4% isodose line was prescribed.  6 MV X-rays were delivered in the flattening filter free beam mode.  The 7.5 mm circular collimator was used for all 3 arcs.  STEREOTACTIC TREATMENT MANAGEMENT:  Following delivery, the patient was transported to nursing in stable condition and monitored for possible acute effects.  Vital signs were recorded BP 123/72 mmHg  Pulse 73  Temp(Src) 97.4 F (36.3 C) (Oral)  Resp 16  SpO2 100%. The patient tolerated treatment without significant acute effects, and was discharged to home in stable condition.    PLAN: Follow-up in one month.  ________________________________  Sheral Apley. Tammi Klippel, M.D.

## 2014-07-05 NOTE — Op Note (Signed)
Name: Vicki Marshall    MRN: [id number]   Date: 07/05/2014    DOB: 1946/01/12   STEREOTACTIC RADIOSURGERY OPERATIVE NOTE  PRE-OPERATIVE DIAGNOSIS:  Metastatic breast cancer to brain  POST-OPERATIVE DIAGNOSIS:  Same  PROCEDURE:  Stereotactic Radiosurgery  SURGEON:  Consuella Lose, MD  RADIATION ONCOLOGIST: Dr. Tyler Pita, MD  TECHNIQUE:  The patient underwent a radiation treatment planning session in the radiation oncology simulation suite under the care of the radiation oncology physician and physicist.  I participated closely in the radiation treatment planning afterwards. The patient underwent planning CT which was fused to 3T high resolution MRI with 1 mm axial slices.  These images were fused on the planning system.  We contoured the gross target volumes and subsequently expanded this to yield the Planning Target Volume. I actively participated in the planning process.  I helped to define and review the target contours and also the contours of the optic pathway, eyes, brainstem and selected nearby organs at risk.  All the dose constraints for critical structures were reviewed and compared to AAPM Task Group 101.  The prescription dose conformity was reviewed.  I approved the plan electronically.    Accordingly, Vicki Marshall  was brought to the TrueBeam stereotactic radiation treatment linac and placed in the custom immobilization mask.  The patient was aligned according to the IR fiducial markers with BrainLab Exactrac, then orthogonal x-rays were used in ExacTrac with the 6DOF robotic table and the shifts were made to align the patient  Vicki Marshall received stereotactic radiosurgery to a prescription dose of 20Gy to both sub-centimeter right posterior parietal lesions uneventfully.    The detailed description of the procedure is recorded in the radiation oncology procedure note.  I was present for the duration of the procedure.  DISPOSITION:   Following delivery, the  patient was transported to nursing in stable condition and monitored for possible acute effects to be discharged to home in stable condition with follow-up in one month.  Consuella Lose, MD [facility]

## 2014-07-05 NOTE — Progress Notes (Signed)
Nurse monitoring complete following SRS. Patient without complaints. Patient alert and oriented to person, place, and time. Denies pain. Denies headache, dizziness, nausea, diplopia or ringing in the ears. Patient understands to contact staff with needs. Patient discharged home with her husband. Steady gait noted.

## 2014-07-05 NOTE — Progress Notes (Signed)
Received patient in nursing following [place] treatment. Vitals stable. Denies pain. Denies headache, dizziness, nausea, diplopia or ringing in the ears. One month follow up appointment card given by Mont Dutton.

## 2014-07-12 ENCOUNTER — Ambulatory Visit: Payer: Medicare Other | Admitting: Radiation Oncology

## 2014-07-12 NOTE — Progress Notes (Signed)
  Radiation Oncology         [phone] ________________________________  Name: Vicki Marshall MRN: [id number]  Date: 07/05/2014  DOB: 10-Jul-1945  End of Treatment Note  ICD-9-CM ICD-10-CM     1. 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  ExacTrac registration was performed for each couch angle.  The 82.6% isodose line was prescribed.  6 MV X-rays were delivered in the flattening filter free beam mode.  The 7.5 mm circular collimator was used for all 3 arcs.  Right parietal 2 mm target was treated using 3 Circular Arcs to a prescription dose of 20 Gy.  ExacTrac registration was performed for each couch angle.  The 79.4% isodose line was prescribed.  6 MV X-rays were delivered in the flattening filter free beam mode.  The 7.5 mm circular collimator was used for all 3 arcs.  Narrative: The patient tolerated radiation treatment relatively well.     Plan: The patient has completed radiation treatment. The patient will return to radiation oncology clinic for routine followup in one month. I advised them to call or return sooner if they have any questions or concerns related to their recovery or treatment. ________________________________  Sheral Apley. Tammi Klippel, M.D.

## 2014-07-21 ENCOUNTER — Other Ambulatory Visit: Payer: Self-pay | Admitting: *Deleted

## 2014-07-21 ENCOUNTER — Ambulatory Visit (HOSPITAL_BASED_OUTPATIENT_CLINIC_OR_DEPARTMENT_OTHER): Payer: Medicare Other

## 2014-07-21 ENCOUNTER — Other Ambulatory Visit (HOSPITAL_BASED_OUTPATIENT_CLINIC_OR_DEPARTMENT_OTHER): Payer: Medicare Other

## 2014-07-21 DIAGNOSIS — C7951 Secondary malignant neoplasm of bone: Secondary | ICD-10-CM

## 2014-07-21 DIAGNOSIS — C50919 Malignant neoplasm of unspecified site of unspecified female breast: Secondary | ICD-10-CM

## 2014-07-21 DIAGNOSIS — C7800 Secondary malignant neoplasm of unspecified lung: Secondary | ICD-10-CM

## 2014-07-21 DIAGNOSIS — C7931 Secondary malignant neoplasm of brain: Secondary | ICD-10-CM

## 2014-07-21 DIAGNOSIS — C787 Secondary malignant neoplasm of liver and intrahepatic bile duct: Secondary | ICD-10-CM

## 2014-07-21 DIAGNOSIS — C50412 Malignant neoplasm of upper-outer quadrant of left female breast: Secondary | ICD-10-CM

## 2014-07-21 DIAGNOSIS — C50912 Malignant neoplasm of unspecified site of left female breast: Secondary | ICD-10-CM

## 2014-07-21 DIAGNOSIS — N1330 Unspecified hydronephrosis: Secondary | ICD-10-CM

## 2014-07-21 LAB — CBC WITH DIFFERENTIAL/PLATELET
BASO%: 1 % (ref 0.0–2.0)
Basophils Absolute: 0 10*3/uL (ref 0.0–0.1)
EOS ABS: 0.2 10*3/uL (ref 0.0–0.5)
EOS%: 5.9 % (ref 0.0–7.0)
HCT: 26.8 % — ABNORMAL LOW (ref 34.8–46.6)
HGB: 8.3 g/dL — ABNORMAL LOW (ref 11.6–15.9)
LYMPH%: 27.7 % (ref 14.0–49.7)
MCH: 31.4 pg (ref 25.1–34.0)
MCHC: 31 g/dL — ABNORMAL LOW (ref 31.5–36.0)
MCV: 101.5 fL — AB (ref 79.5–101.0)
MONO#: 0.5 10*3/uL (ref 0.1–0.9)
MONO%: 15.2 % — AB (ref 0.0–14.0)
NEUT%: 50.2 % (ref 38.4–76.8)
NEUTROS ABS: 1.5 10*3/uL (ref 1.5–6.5)
Platelets: 103 10*3/uL — ABNORMAL LOW (ref 145–400)
RBC: 2.64 10*6/uL — AB (ref 3.70–5.45)
RDW: 18.1 % — ABNORMAL HIGH (ref 11.2–14.5)
WBC: 3 10*3/uL — AB (ref 3.9–10.3)
lymph#: 0.8 10*3/uL — ABNORMAL LOW (ref 0.9–3.3)

## 2014-07-21 LAB — COMPREHENSIVE METABOLIC PANEL (CC13)
ALBUMIN: 2.8 g/dL — AB (ref 3.5–5.0)
ALK PHOS: 183 U/L — AB (ref 40–150)
ALT: 31 U/L (ref 0–55)
AST: 83 U/L — AB (ref 5–34)
Anion Gap: 11 mEq/L (ref 3–11)
BILIRUBIN TOTAL: 0.45 mg/dL (ref 0.20–1.20)
BUN: 25 mg/dL (ref 7.0–26.0)
CO2: 21 mEq/L — ABNORMAL LOW (ref 22–29)
Calcium: 8.6 mg/dL (ref 8.4–10.4)
Chloride: 108 mEq/L (ref 98–109)
Creatinine: 1.3 mg/dL — ABNORMAL HIGH (ref 0.6–1.1)
EGFR: 41 mL/min/{1.73_m2} — AB (ref >90)
GLUCOSE: 105 mg/dL (ref 70–140)
Potassium: 4.2 mEq/L (ref 3.5–5.1)
Sodium: 140 mEq/L (ref 136–145)
TOTAL PROTEIN: 6.8 g/dL (ref 6.4–8.3)

## 2014-07-21 MED ORDER — DENOSUMAB 120 MG/1.7ML ~~LOC~~ SOLN
120.0000 mg | Freq: Once | SUBCUTANEOUS | Status: AC
  Administered 2014-07-21: 120 mg via SUBCUTANEOUS
  Filled 2014-07-21: qty 1.7

## 2014-08-03 ENCOUNTER — Ambulatory Visit (HOSPITAL_BASED_OUTPATIENT_CLINIC_OR_DEPARTMENT_OTHER): Payer: Medicare Other | Admitting: Oncology

## 2014-08-03 ENCOUNTER — Other Ambulatory Visit (HOSPITAL_BASED_OUTPATIENT_CLINIC_OR_DEPARTMENT_OTHER): Payer: Medicare Other

## 2014-08-03 VITALS — BP 143/70 | HR 111 | Temp 98.1°F | Resp 18 | Ht 65.0 in | Wt 113.0 lb

## 2014-08-03 DIAGNOSIS — D630 Anemia in neoplastic disease: Secondary | ICD-10-CM

## 2014-08-03 DIAGNOSIS — C787 Secondary malignant neoplasm of liver and intrahepatic bile duct: Secondary | ICD-10-CM

## 2014-08-03 DIAGNOSIS — C50919 Malignant neoplasm of unspecified site of unspecified female breast: Secondary | ICD-10-CM

## 2014-08-03 DIAGNOSIS — N183 Chronic kidney disease, stage 3 unspecified: Secondary | ICD-10-CM

## 2014-08-03 DIAGNOSIS — C7931 Secondary malignant neoplasm of brain: Secondary | ICD-10-CM

## 2014-08-03 DIAGNOSIS — C7951 Secondary malignant neoplasm of bone: Secondary | ICD-10-CM

## 2014-08-03 DIAGNOSIS — C50912 Malignant neoplasm of unspecified site of left female breast: Secondary | ICD-10-CM

## 2014-08-03 DIAGNOSIS — C773 Secondary and unspecified malignant neoplasm of axilla and upper limb lymph nodes: Secondary | ICD-10-CM

## 2014-08-03 DIAGNOSIS — C50412 Malignant neoplasm of upper-outer quadrant of left female breast: Secondary | ICD-10-CM

## 2014-08-03 DIAGNOSIS — C7800 Secondary malignant neoplasm of unspecified lung: Secondary | ICD-10-CM

## 2014-08-03 DIAGNOSIS — L271 Localized skin eruption due to drugs and medicaments taken internally: Secondary | ICD-10-CM

## 2014-08-03 DIAGNOSIS — N1330 Unspecified hydronephrosis: Secondary | ICD-10-CM

## 2014-08-03 DIAGNOSIS — D631 Anemia in chronic kidney disease: Secondary | ICD-10-CM

## 2014-08-03 LAB — COMPREHENSIVE METABOLIC PANEL (CC13)
ALBUMIN: 2.7 g/dL — AB (ref 3.5–5.0)
ALT: 30 U/L (ref 0–55)
AST: 79 U/L — AB (ref 5–34)
Alkaline Phosphatase: 170 U/L — ABNORMAL HIGH (ref 40–150)
Anion Gap: 12 mEq/L — ABNORMAL HIGH (ref 3–11)
BUN: 21.7 mg/dL (ref 7.0–26.0)
CHLORIDE: 105 meq/L (ref 98–109)
CO2: 21 mEq/L — ABNORMAL LOW (ref 22–29)
Calcium: 8.9 mg/dL (ref 8.4–10.4)
Creatinine: 1.1 mg/dL (ref 0.6–1.1)
EGFR: 49 mL/min/{1.73_m2} — ABNORMAL LOW (ref >90)
Glucose: 111 mg/dl (ref 70–140)
POTASSIUM: 3.8 meq/L (ref 3.5–5.1)
SODIUM: 138 meq/L (ref 136–145)
Total Bilirubin: 0.59 mg/dL (ref 0.20–1.20)
Total Protein: 6.7 g/dL (ref 6.4–8.3)

## 2014-08-03 LAB — CBC WITH DIFFERENTIAL/PLATELET
BASO%: 0.9 % (ref 0.0–2.0)
Basophils Absolute: 0 10*3/uL (ref 0.0–0.1)
EOS%: 4.3 % (ref 0.0–7.0)
Eosinophils Absolute: 0.1 10*3/uL (ref 0.0–0.5)
HEMATOCRIT: 24.1 % — AB (ref 34.8–46.6)
HEMOGLOBIN: 7.4 g/dL — AB (ref 11.6–15.9)
LYMPH%: 25.3 % (ref 14.0–49.7)
MCH: 30.5 pg (ref 25.1–34.0)
MCHC: 30.7 g/dL — ABNORMAL LOW (ref 31.5–36.0)
MCV: 99.2 fL (ref 79.5–101.0)
MONO#: 0.5 10*3/uL (ref 0.1–0.9)
MONO%: 14 % (ref 0.0–14.0)
NEUT#: 1.8 10*3/uL (ref 1.5–6.5)
NEUT%: 55.5 % (ref 38.4–76.8)
Platelets: 86 10*3/uL — ABNORMAL LOW (ref 145–400)
RBC: 2.43 10*6/uL — ABNORMAL LOW (ref 3.70–5.45)
RDW: 18.7 % — ABNORMAL HIGH (ref 11.2–14.5)
WBC: 3.3 10*3/uL — AB (ref 3.9–10.3)
lymph#: 0.8 10*3/uL — ABNORMAL LOW (ref 0.9–3.3)

## 2014-08-03 NOTE — Progress Notes (Signed)
[place]  [phone] [phone]     ID: Vicki Marshall OB: [birth year]  MR#: [id number]  JYN#:[id number]  PCP: Vicki Coma, MD GYN:   SU: Vicki Marshall OTHER MD: Vicki Marshall, Vicki Marshall, Vicki Marshall DDS, Vicki Marshall, Vicki Marshall, Vicki Marshall  CHIEF COMPLAINT: stage IV breast cancer, estrogen receptor positive  TREATMENT: exemestane, everolimus and denosumab  BREAST CANCER HISTORY: From the original intake note 11/04/2013:  Vicki Marshall noted a change in her left breast approximately mid 2014. This was a flattening of the lateral aspect of the left breast. Eventually she noted an indentation and eventually a "sore" in that area. She had not shared this information with her husband, and she did not have a primary care physician. More recently, as she started to have symptoms related to this, she mentioned it to her husband Vicki Marshall and she establish yourself with Dr. Stephanie Marshall, who set her up for bilateral diagnostic mammography and ultrasonography at the [facility] 10/23/2013.. This showed a breast density to be category C. The right breast was negative.  The left breast contained an ulcerated mass laterally measuring at least 4 cm mammography. On physical exam there was an ulcerated mass in the 2:00 location of the left breast, associated with erythematous skin nodules in the upper inner quadrant. The left breast was smaller than the right, and firm. There were palpable left axillary lymph nodes. In addition, on the right upper chest wall, infraclavicular like, there was a firm nodule separate from the bony structures. There was also a right preauricular node and a firm left submandibular node.  Ultrasound showed multiple nodules throughout the lower portion of the left breast. There was a large mass in the upper outer quadrant of the left breast contiguous with the skin ulceration, measuring greater than 5 cm. The left axilla  showed at least 3 suspicious masses, the largest measuring 1.9 cm. Ultrasound of the palpable abnormality of the right chest wall showed a superficial irregular mass, subdermal, measuring 0.8 cm.  Biopsies of the left breast mass, a left axillary lymph node, and the right infra-clavicular mass on 10/23/2013, all showed ([id number]) and invasive ductal carcinoma, grade 1 or 2, estrogen receptor 100% positive, progesterone receptor 23% positive, both with strong staining intensity, with an MIB-1 of 61%, and no HER-2 amplification, the signals ratio being 0.90, and the number per cell 1.80.  On 11/02/2013 the patient underwent bilateral breast MRI and this showed, in the left breast, a 5.8 cm mass eroding through the overlying skin, and also invading the underlying pectoralis muscle and chest wall. There were multiple enhancing nodules throughout the left breast and an adjacent 4.8 cm irregular enhancing mass inferiorly. There are multiple large irregular left axillary lymph nodes, the largest measuring 2.4 cm. There was a 0.9 cm left subpectoral lymph node as well as prevascular and precarinal lymph nodes.  In the right breast far upper inner quadrant there was a 0.8 cm irregular enhancing mass abutting the overlying skin. This is the mass that was biopsied and shown to be positive. In addition there was patchy heterogeneous enhancement of the sternum.  The patient's subsequent history is as detailed below  INTERVAL HISTORY: Baby returns today for follow-up of her metastatic breast cancer accompanied by her husband, Vicki Marshall.  She was started on exemestane and everolimus early January. She is obtaining the medication at no cost at present. She has  Some symptoms that she thinks may be related. These include decreased appetite, altered  taste, mild epistaxis (she has a very dry nose in the morning she says ), rash, and fatigue. She has not had any mucositis although she did have one mouth sore early in  January which she resolved with saline rinses. She has had no cough or pleurisy.  REVIEW OF SYSTEMS:  She certainly fatigued, although she still gets around and visits with friends for lunch , which she enjoys. She does not think she could do something like a beach drip at present. She denies palpitations, fainting, or severe shortness of breath. The epistaxis is mostly in the morning when she picks her nose or blows her nose, after it becoming very dry overnight. The rash she is having is very scattered and chiefly over the right arm, slightly over the left. It was also in her shoulder but her husband is now doing some emollients and that seems to be helping. She has her chronic back pain which is not more intense or persistent than before.  She denies any unusual headaches, visual changes, dizziness, gait imbalance, nausea, or vomiting.Otherwise a detailed review of systems today was noncontributory  PAST MEDICAL HISTORY: Past Medical History  Diagnosis Date  . Urinary tract infection   . Anemia   . Breast cancer     T3N2M1 stage IV invasive ductal carcinoma   . Brain metastases   . Hydronephrosis, left   . Lung metastases   . Bone metastases     PAST SURGICAL HISTORY: Past Surgical History  Procedure Laterality Date  . Cataract extraction  2013  . Mouth surgery  1981    FAMILY HISTORY: The patient's father died at the age of 79, from pneumonia. The patient's mother lived to be 75. The patient had no brothers, one sister. There is no history of breast or ovarian cancer in the family  GYNECOLOGIC HISTORY:  Menarche age 68. The patient is GX P0. She stopped having periods approximately 2003. She did not take hormone replacement. She used birth control pills for approximately 8 years remotely, with no complications   SOCIAL HISTORY:  Chrishana retired about a year ago. 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 Her husband Vicki Marshall is retired from working in  Charity fundraiser. They live alone, with no pets.    ADVANCED DIRECTIVES: Not in place   HEALTH MAINTENANCE: History  Substance Use Topics  . Smoking status: Never Smoker   . Smokeless tobacco: Never Used  . Alcohol Use: Yes     Comment: 10 12 glasses/week     Colonoscopy: Never  PAP: 1990  Bone density: Never  Lipid panel:  No Known Allergies  Current Outpatient Prescriptions  Medication Sig Dispense Refill  . ALPRAZolam (XANAX) 0.5 MG tablet Take 1 tablet (0.5 mg total) by mouth at bedtime as needed for anxiety. 10 tablet 0  . aspirin 81 MG EC tablet Take 81 mg by mouth daily. Swallow whole.    . calcium carbonate (TUMS - DOSED IN MG ELEMENTAL CALCIUM) 500 MG chewable tablet Chew 1 tablet by mouth daily.    Marland Kitchen denosumab (XGEVA) 120 MG/1.7ML SOLN injection Inject 120 mg into the skin every 28 (twenty-eight) days.    Marland Kitchen everolimus (AFINITOR) 10 MG tablet Take 1 tablet (10 mg total) by mouth daily. 30 tablet 12  . exemestane (AROMASIN) 25 MG tablet Take 1 tablet (25 mg total) by mouth daily after breakfast. 30 tablet 12  . LORazepam (ATIVAN) 0.5 MG tablet      No current facility-administered  medications for this visit.    OBJECTIVE: Middle-aged white woman  In no acute distress Filed Vitals:   08/03/14 1616  BP: 143/70  Pulse: 111  Temp: 98.1 F (36.7 C)  Resp: 18     Body mass index is 18.8 kg/(m^2).    ECOG FS:1 - Symptomatic but completely ambulatory  Sclerae unicteric,  EOMs intact, pupils round and reactive Oropharynx clear, full upper plate , no thrush or other lesions noted No cervical or supraclavicular adenopathy Lungs no rales or rhonchi Heart regular rate and rhythm Abd soft, nontender, positive bowel sounds MSK mild kyphosis but no focal spinal tenderness,  No upper extremity lymphedema Neuro: nonfocal, well oriented,  positiveaffect Breasts:  deferred  LAB RESULTS:  CMP     Component Value Date/Time   NA 138 08/03/2014 1558   K 3.8 08/03/2014 1558   CO2  21* 08/03/2014 1558   GLUCOSE 111 08/03/2014 1558   BUN 21.7 08/03/2014 1558   CREATININE 1.1 08/03/2014 1558   CALCIUM 8.9 08/03/2014 1558   PROT 6.7 08/03/2014 1558   ALBUMIN 2.7* 08/03/2014 1558   AST 79* 08/03/2014 1558   ALT 30 08/03/2014 1558   ALKPHOS 170* 08/03/2014 1558   BILITOT 0.59 08/03/2014 1558    I No results found for: SPEP  Lab Results  Component Value Date   WBC 3.3* 08/03/2014   NEUTROABS 1.8 08/03/2014   HGB 7.4* 08/03/2014   HCT 24.1* 08/03/2014   MCV 99.2 08/03/2014   PLT 86* 08/03/2014      Chemistry      Component Value Date/Time   NA 138 08/03/2014 1558   K 3.8 08/03/2014 1558   CO2 21* 08/03/2014 1558   BUN 21.7 08/03/2014 1558   CREATININE 1.1 08/03/2014 1558      Component Value Date/Time   CALCIUM 8.9 08/03/2014 1558   ALKPHOS 170* 08/03/2014 1558   AST 79* 08/03/2014 1558   ALT 30 08/03/2014 1558   BILITOT 0.59 08/03/2014 1558       No results found for: LABCA2  No components found for: LABCA125  No results for input(s): INR in the last 168 hours.  Urinalysis No results found for: COLORURINE  STUDIES: No results found.   ASSESSMENT: 69 y.o. [place] woman status post left breast and left axillary lymph node biopsy as well as right infraclavicular chest wall biopsy 10/23/2013 for a clinical T3, N2, M1, stage IV invasive ductal carcinoma, grade 2, estrogen receptor 100% positive, progesterone receptor 23% positive, with an MIB-1 of 61%, and no HER-2 amplification.  (1) staging studies 11/05/2013 showed metastatic involvement of bone, liver, lungs, and brain  (2) antiestrogen therapy with letrozole and fulvestrant as well as targeted therapy with palbociclib started 11/11/2013, discontinued January 2015 with progression  (3) denosumab monthly started 11/11/2013  (4) s/p stereotactic radiosurgery to brain metastases 03/19/2014 - Left lateral cerebellar 4 mm to 20 Gy.   - Left medial cerebellar 3 mm to 20 Gy  (5)  Right hydronephrosis w/o obstruction-- followed by urology (Vicki Marshall)--resolved as of abd MRI 06/02/2014  (6) exemestane and everolimus started 06/07/2014  (7) s/p further stereotactic radiosurgery 07/05/2014  R parietal 3 mm target: 20 Gy  R parietal 2 mm target: 20 Gy  (8) anemia of CKD;  To start darbepoietin 08/04/2014  PLAN:  I think Jozey is tolerating the everolimus well. She has not had the things we have most worry about, which is the mucositis and pneumonitis.  I suggested she use saline nasal spray,  start loratadine, and keep the window cracked at night so her nostrils do not become so dried out. I don't think her mild epistaxis is really related to the medication.  Her sense of altered taste could be related to the everolimus but it also could be related to the  Tumors in the liver. In general whenever the liver is irritated loss of appetite, altered taste and some nausea are possible. The rash certainly could be related to the everolimus or it could also be related to dry skin.   In short while there are some possible side effects, I would not want to change your dose at this point.   I do think we need to do something about the anemia. This is partly due to lack of erythropoietin because of her chronic kidney disease, and possibly partly due to her chemotherapy. We are going to start our nest tomorrow and today we discussed the possible toxicities, side effects and complications of this agent. The goal is to try to get her hemoglobin between 10 and 11. She will receive it every 2 weeks. We will obtain a ferritin with the next set of labs to make sure there is not some underlying arm deficiency which might impede the EPO from working   We're going to repeat a liver MRI the first week in April and she will see me a few days after that to discuss results. She will follow-up with Dr. Tammi Marshall next week regarding her brain lesions.  Alyson has a good understanding of the overall  plan. She agrees with it. She knows the goal of treatment in her case is control. She will call with any problems that may develop before her next visit here.  Chauncey Cruel, MD

## 2014-08-04 ENCOUNTER — Ambulatory Visit (HOSPITAL_BASED_OUTPATIENT_CLINIC_OR_DEPARTMENT_OTHER): Payer: Medicare Other

## 2014-08-04 ENCOUNTER — Telehealth: Payer: Self-pay | Admitting: Oncology

## 2014-08-04 DIAGNOSIS — C50412 Malignant neoplasm of upper-outer quadrant of left female breast: Secondary | ICD-10-CM

## 2014-08-04 DIAGNOSIS — N183 Chronic kidney disease, stage 3 (moderate): Secondary | ICD-10-CM

## 2014-08-04 DIAGNOSIS — D631 Anemia in chronic kidney disease: Secondary | ICD-10-CM

## 2014-08-04 MED ORDER — DARBEPOETIN ALFA 200 MCG/0.4ML IJ SOSY
200.0000 ug | PREFILLED_SYRINGE | Freq: Once | INTRAMUSCULAR | Status: AC
  Administered 2014-08-04: 200 ug via SUBCUTANEOUS
  Filled 2014-08-04: qty 0.4

## 2014-08-04 NOTE — Telephone Encounter (Signed)
S/w pt confirming inj apt for 03/02 per 03/01 POF, put scheduled downstairs for pt to p/u b4 going to registration.... KJ

## 2014-08-09 ENCOUNTER — Ambulatory Visit
Admission: RE | Admit: 2014-08-09 | Discharge: 2014-08-09 | Disposition: A | Discharge status: Home / Self Care | Payer: Medicare Other | Source: Ambulatory Visit | Attending: Radiation Oncology | Admitting: Radiation Oncology

## 2014-08-09 ENCOUNTER — Encounter: Payer: Self-pay | Admitting: Radiation Oncology

## 2014-08-09 VITALS — Wt 112.8 lb

## 2014-08-09 DIAGNOSIS — C7931 Secondary malignant neoplasm of brain: Principal | ICD-10-CM

## 2014-08-09 DIAGNOSIS — C50919 Malignant neoplasm of unspecified site of unspecified female breast: Secondary | ICD-10-CM

## 2014-08-09 NOTE — Progress Notes (Signed)
  Radiation Oncology         [phone]   Name: Vicki Marshall   Date: 08/09/2014   MRN: [id number]  DOB: April 02, 1946    Multidisciplinary Brain and Spine Oncology Clinic Follow-Up Visit Note  CC: Lilian Coma, MD  Lilian Coma, MD    ICD-9-CM ICD-10-CM   1. Breast cancer metastasized to brain, unspecified laterality 174.9 C50.919    198.3 C79.31     Diagnosis:   69 yo woman with brain metastases from ER positive HER-2 negative invasive ductal carcinoma of the upper outer quadrant of the left breast - stage IV s/p SRS on  03/19/2014 - Left lateral cerebellar 4 mm to 20 Gy.   - Left medial cerebellar 3 mm to 20 Gy 07/05/2014   - Right parietal 3 mm to 20 Gy.    - Right parietal 2 mm to 20 Gy  Interval Since Last Radiation:  4  weeks  Narrative:  The patient returns today for routine follow-up.  The recent films were presented in our multidisciplinary conference with neuroradiology just prior to the clinic.  H and H down thus started aranesp in conjunction with xgeva. SOB noted. Denies headache, dizziness, nausea or vomiting. Denies diplopia or ringing in the ears. Steady gait noted. Weight and vitals stable. Denies pain.                               ALLERGIES:  has No Known Allergies.  Meds: Current Outpatient Prescriptions  Medication Sig Dispense Refill  . aspirin 81 MG EC tablet Take 81 mg by mouth daily. Swallow whole.    . calcium carbonate (TUMS - DOSED IN MG ELEMENTAL CALCIUM) 500 MG chewable tablet Chew 1 tablet by mouth daily.    . darbepoetin (ARANESP) 100 MCG/0.5ML SOLN injection Inject 100 mcg into the skin.    Marland Kitchen denosumab (XGEVA) 120 MG/1.7ML SOLN injection Inject 120 mg into the skin every 28 (twenty-eight) days.    Marland Kitchen everolimus (AFINITOR) 10 MG tablet Take 1 tablet (10 mg total) by mouth daily. 30 tablet 12  . exemestane (AROMASIN) 25 MG tablet Take 1 tablet (25 mg total) by mouth daily after breakfast. 30 tablet 12  . ALPRAZolam (XANAX) 0.5 MG  tablet Take 1 tablet (0.5 mg total) by mouth at bedtime as needed for anxiety. (Patient not taking: Reported on 08/09/2014) 10 tablet 0  . LORazepam (ATIVAN) 0.5 MG tablet      No current facility-administered medications for this encounter.    Physical Findings: The patient is in no acute distress. Patient is alert and oriented.  weight is 112 lb 12.8 oz (51.166 kg). .  No significant changes.  Lab Findings: Lab Results  Component Value Date   WBC 3.3* 08/03/2014   HGB 7.4* 08/03/2014   HCT 24.1* 08/03/2014   MCV 99.2 08/03/2014   PLT 86* 08/03/2014    Impression:  The patient is recovering from the effects of radiation.    Plan:  Brain MRI in 2 months, then, follow-up.  _____________________________________  Sheral Apley. Tammi Klippel, M.D.

## 2014-08-09 NOTE — Progress Notes (Signed)
H and H down thus started aranesp in conjunction with xgeva. SOB noted. Denies headache, dizziness, nausea or vomiting. Denies diplopia or ringing in the ears. Steady gait noted. Weight and vitals stable. Denies pain.

## 2014-08-17 ENCOUNTER — Other Ambulatory Visit: Payer: Self-pay | Admitting: *Deleted

## 2014-08-17 ENCOUNTER — Telehealth: Payer: Self-pay | Admitting: Oncology

## 2014-08-17 NOTE — Telephone Encounter (Signed)
per pof to sch pt appt-per Val pt aware

## 2014-08-18 ENCOUNTER — Other Ambulatory Visit: Payer: Medicare Other

## 2014-08-18 ENCOUNTER — Ambulatory Visit (HOSPITAL_BASED_OUTPATIENT_CLINIC_OR_DEPARTMENT_OTHER): Payer: Medicare Other

## 2014-08-18 ENCOUNTER — Telehealth: Payer: Self-pay | Admitting: Oncology

## 2014-08-18 ENCOUNTER — Other Ambulatory Visit (HOSPITAL_BASED_OUTPATIENT_CLINIC_OR_DEPARTMENT_OTHER): Payer: Medicare Other

## 2014-08-18 ENCOUNTER — Ambulatory Visit: Payer: Medicare Other

## 2014-08-18 DIAGNOSIS — C50412 Malignant neoplasm of upper-outer quadrant of left female breast: Secondary | ICD-10-CM | POA: Diagnosis not present

## 2014-08-18 DIAGNOSIS — D630 Anemia in neoplastic disease: Secondary | ICD-10-CM

## 2014-08-18 DIAGNOSIS — N1330 Unspecified hydronephrosis: Secondary | ICD-10-CM

## 2014-08-18 DIAGNOSIS — C7951 Secondary malignant neoplasm of bone: Secondary | ICD-10-CM | POA: Diagnosis not present

## 2014-08-18 DIAGNOSIS — C7931 Secondary malignant neoplasm of brain: Secondary | ICD-10-CM

## 2014-08-18 DIAGNOSIS — D631 Anemia in chronic kidney disease: Secondary | ICD-10-CM

## 2014-08-18 DIAGNOSIS — Z7951 Long term (current) use of inhaled steroids: Secondary | ICD-10-CM | POA: Diagnosis not present

## 2014-08-18 DIAGNOSIS — C50919 Malignant neoplasm of unspecified site of unspecified female breast: Secondary | ICD-10-CM

## 2014-08-18 DIAGNOSIS — N183 Chronic kidney disease, stage 3 (moderate): Secondary | ICD-10-CM

## 2014-08-18 DIAGNOSIS — C50912 Malignant neoplasm of unspecified site of left female breast: Secondary | ICD-10-CM

## 2014-08-18 LAB — CBC & DIFF AND RETIC
BASO%: 0.9 % (ref 0.0–2.0)
Basophils Absolute: 0 10*3/uL (ref 0.0–0.1)
EOS%: 2.6 % (ref 0.0–7.0)
Eosinophils Absolute: 0.1 10*3/uL (ref 0.0–0.5)
HEMATOCRIT: 23.4 % — AB (ref 34.8–46.6)
HGB: 7.3 g/dL — ABNORMAL LOW (ref 11.6–15.9)
Immature Retic Fract: 16 % — ABNORMAL HIGH (ref 1.60–10.00)
LYMPH%: 15.5 % (ref 14.0–49.7)
MCH: 30.2 pg (ref 25.1–34.0)
MCHC: 31.2 g/dL — ABNORMAL LOW (ref 31.5–36.0)
MCV: 96.7 fL (ref 79.5–101.0)
MONO#: 0.4 10*3/uL (ref 0.1–0.9)
MONO%: 10.5 % (ref 0.0–14.0)
NEUT#: 2.4 10*3/uL (ref 1.5–6.5)
NEUT%: 70.5 % (ref 38.4–76.8)
Platelets: 72 10*3/uL — ABNORMAL LOW (ref 145–400)
RBC: 2.42 10*6/uL — ABNORMAL LOW (ref 3.70–5.45)
RDW: 19.4 % — AB (ref 11.2–14.5)
Retic %: 2.9 % — ABNORMAL HIGH (ref 0.70–2.10)
Retic Ct Abs: 70.18 10*3/uL (ref 33.70–90.70)
WBC: 3.4 10*3/uL — ABNORMAL LOW (ref 3.9–10.3)
lymph#: 0.5 10*3/uL — ABNORMAL LOW (ref 0.9–3.3)

## 2014-08-18 LAB — COMPREHENSIVE METABOLIC PANEL (CC13)
ALT: 35 U/L (ref 0–55)
AST: 75 U/L — AB (ref 5–34)
Albumin: 2.6 g/dL — ABNORMAL LOW (ref 3.5–5.0)
Alkaline Phosphatase: 156 U/L — ABNORMAL HIGH (ref 40–150)
Anion Gap: 12 mEq/L — ABNORMAL HIGH (ref 3–11)
BILIRUBIN TOTAL: 0.56 mg/dL (ref 0.20–1.20)
BUN: 22.6 mg/dL (ref 7.0–26.0)
CO2: 21 mEq/L — ABNORMAL LOW (ref 22–29)
CREATININE: 1.3 mg/dL — AB (ref 0.6–1.1)
Calcium: 8.5 mg/dL (ref 8.4–10.4)
Chloride: 103 mEq/L (ref 98–109)
EGFR: 42 mL/min/{1.73_m2} — ABNORMAL LOW (ref >90)
Glucose: 117 mg/dl (ref 70–140)
Potassium: 4.2 mEq/L (ref 3.5–5.1)
Sodium: 136 mEq/L (ref 136–145)
TOTAL PROTEIN: 6.7 g/dL (ref 6.4–8.3)

## 2014-08-18 LAB — FERRITIN CHCC: Ferritin: 482 ng/ml — ABNORMAL HIGH (ref 9–269)

## 2014-08-18 MED ORDER — DARBEPOETIN ALFA 200 MCG/0.4ML IJ SOSY
200.0000 ug | PREFILLED_SYRINGE | Freq: Once | INTRAMUSCULAR | Status: AC
  Administered 2014-08-18: 200 ug via SUBCUTANEOUS
  Filled 2014-08-18: qty 0.4

## 2014-08-18 MED ORDER — DENOSUMAB 120 MG/1.7ML ~~LOC~~ SOLN
120.0000 mg | Freq: Once | SUBCUTANEOUS | Status: AC
  Administered 2014-08-18: 120 mg via SUBCUTANEOUS
  Filled 2014-08-18: qty 1.7

## 2014-08-18 NOTE — Telephone Encounter (Signed)
Pt confirmed labs/inj per 03/01 POF, s/w Val to confirm updated schedule with labs/inj for every other week.Marland Kitchen... KJ

## 2014-08-18 NOTE — Patient Instructions (Signed)
Darbepoetin Alfa injection What is this medicine? DARBEPOETIN ALFA (dar be POE e tin AL fa) helps your body make more red blood cells. It is used to treat anemia caused by chronic kidney failure and chemotherapy. This medicine may be used for other purposes; ask your health care provider or pharmacist if you have questions. COMMON BRAND NAME(S): Aranesp What should I tell my health care provider before I take this medicine? They need to know if you have any of these conditions: -blood clotting disorders or history of blood clots -cancer patient not on chemotherapy -cystic fibrosis -heart disease, such as angina, heart failure, or a history of a heart attack -hemoglobin level of 12 g/dL or greater -high blood pressure -low levels of folate, iron, or vitamin B12 -seizures -an unusual or allergic reaction to darbepoetin, erythropoietin, albumin, hamster proteins, latex, other medicines, foods, dyes, or preservatives -pregnant or trying to get pregnant -breast-feeding How should I use this medicine? This medicine is for injection into a vein or under the skin. It is usually given by a health care professional in a hospital or clinic setting. If you get this medicine at home, you will be taught how to prepare and give this medicine. Do not shake the solution before you withdraw a dose. Use exactly as directed. Take your medicine at regular intervals. Do not take your medicine more often than directed. It is important that you put your used needles and syringes in a special sharps container. Do not put them in a trash can. If you do not have a sharps container, call your pharmacist or healthcare provider to get one. Talk to your pediatrician regarding the use of this medicine in children. While this medicine may be used in children as young as 1 year for selected conditions, precautions do apply. Overdosage: If you think you have taken too much of this medicine contact a poison control center or  emergency room at once. NOTE: This medicine is only for you. Do not share this medicine with others. What if I miss a dose? If you miss a dose, take it as soon as you can. If it is almost time for your next dose, take only that dose. Do not take double or extra doses. What may interact with this medicine? Do not take this medicine with any of the following medications: -epoetin alfa This list may not describe all possible interactions. Give your health care provider a list of all the medicines, herbs, non-prescription drugs, or dietary supplements you use. Also tell them if you smoke, drink alcohol, or use illegal drugs. Some items may interact with your medicine. What should I watch for while using this medicine? Visit your prescriber or health care professional for regular checks on your progress and for the needed blood tests and blood pressure measurements. It is especially important for the doctor to make sure your hemoglobin level is in the desired range, to limit the risk of potential side effects and to give you the best benefit. Keep all appointments for any recommended tests. Check your blood pressure as directed. Ask your doctor what your blood pressure should be and when you should contact him or her. As your body makes more red blood cells, you may need to take iron, folic acid, or vitamin B supplements. Ask your doctor or health care provider which products are right for you. If you have kidney disease continue dietary restrictions, even though this medication can make you feel better. Talk with your doctor or health   care professional about the foods you eat and the vitamins that you take. What side effects may I notice from receiving this medicine? Side effects that you should report to your doctor or health care professional as soon as possible: -allergic reactions like skin rash, itching or hives, swelling of the face, lips, or tongue -breathing problems -changes in vision -chest  pain -confusion, trouble speaking or understanding -feeling faint or lightheaded, falls -high blood pressure -muscle aches or pains -pain, swelling, warmth in the leg -rapid weight gain -severe headaches -sudden numbness or weakness of the face, arm or leg -trouble walking, dizziness, loss of balance or coordination -seizures (convulsions) -swelling of the ankles, feet, hands -unusually weak or tired Side effects that usually do not require medical attention (report to your doctor or health care professional if they continue or are bothersome): -diarrhea -fever, chills (flu-like symptoms) -headaches -nausea, vomiting -redness, stinging, or swelling at site where injected This list may not describe all possible side effects. Call your doctor for medical advice about side effects. You may report side effects to FDA at 1-800-FDA-1088. Where should I keep my medicine? Keep out of the reach of children. Store in a refrigerator between 2 and 8 degrees C (36 and 46 degrees F). Do not freeze. Do not shake. Throw away any unused portion if using a single-dose vial. Throw away any unused medicine after the expiration date. NOTE: This sheet is a summary. It may not cover all possible information. If you have questions about this medicine, talk to your doctor, pharmacist, or health care provider.  2015, [employer]vier/[employer] Standard. (2008-05-04 10:23:57)  

## 2014-08-23 ENCOUNTER — Telehealth: Payer: Self-pay | Admitting: Nurse Practitioner

## 2014-08-23 NOTE — Telephone Encounter (Signed)
pt cld to r/s appt-gave pt updated time & date °

## 2014-08-25 ENCOUNTER — Other Ambulatory Visit: Payer: Medicare Other

## 2014-08-25 ENCOUNTER — Ambulatory Visit: Payer: Medicare Other

## 2014-08-30 ENCOUNTER — Other Ambulatory Visit: Payer: Self-pay | Admitting: Radiation Therapy

## 2014-08-30 DIAGNOSIS — C7931 Secondary malignant neoplasm of brain: Secondary | ICD-10-CM

## 2014-09-01 ENCOUNTER — Other Ambulatory Visit: Payer: Self-pay | Admitting: *Deleted

## 2014-09-01 ENCOUNTER — Ambulatory Visit (HOSPITAL_COMMUNITY)
Admission: RE | Admit: 2014-09-01 | Discharge: 2014-09-01 | Disposition: A | Discharge status: Home / Self Care | Payer: Medicare Other | Source: Ambulatory Visit | Attending: Oncology | Admitting: Oncology

## 2014-09-01 ENCOUNTER — Other Ambulatory Visit: Payer: Medicare Other

## 2014-09-01 ENCOUNTER — Ambulatory Visit (HOSPITAL_BASED_OUTPATIENT_CLINIC_OR_DEPARTMENT_OTHER): Payer: Medicare Other

## 2014-09-01 ENCOUNTER — Ambulatory Visit: Payer: Medicare Other

## 2014-09-01 ENCOUNTER — Other Ambulatory Visit (HOSPITAL_BASED_OUTPATIENT_CLINIC_OR_DEPARTMENT_OTHER): Payer: Medicare Other

## 2014-09-01 DIAGNOSIS — D631 Anemia in chronic kidney disease: Secondary | ICD-10-CM

## 2014-09-01 DIAGNOSIS — C50912 Malignant neoplasm of unspecified site of left female breast: Secondary | ICD-10-CM

## 2014-09-01 DIAGNOSIS — C7951 Secondary malignant neoplasm of bone: Secondary | ICD-10-CM

## 2014-09-01 DIAGNOSIS — N183 Chronic kidney disease, stage 3 (moderate): Secondary | ICD-10-CM

## 2014-09-01 DIAGNOSIS — D508 Other iron deficiency anemias: Secondary | ICD-10-CM | POA: Insufficient documentation

## 2014-09-01 DIAGNOSIS — C50919 Malignant neoplasm of unspecified site of unspecified female breast: Secondary | ICD-10-CM

## 2014-09-01 DIAGNOSIS — C7931 Secondary malignant neoplasm of brain: Secondary | ICD-10-CM

## 2014-09-01 DIAGNOSIS — C50412 Malignant neoplasm of upper-outer quadrant of left female breast: Secondary | ICD-10-CM

## 2014-09-01 DIAGNOSIS — N1330 Unspecified hydronephrosis: Secondary | ICD-10-CM

## 2014-09-01 LAB — CBC WITH DIFFERENTIAL/PLATELET
BASO%: 1.2 % (ref 0.0–2.0)
Basophils Absolute: 0.1 10*3/uL (ref 0.0–0.1)
EOS%: 1.9 % (ref 0.0–7.0)
Eosinophils Absolute: 0.1 10*3/uL (ref 0.0–0.5)
HCT: 22.3 % — ABNORMAL LOW (ref 34.8–46.6)
HGB: 6.9 g/dL — CL (ref 11.6–15.9)
LYMPH#: 1 10*3/uL (ref 0.9–3.3)
LYMPH%: 23.8 % (ref 14.0–49.7)
MCH: 30 pg (ref 25.1–34.0)
MCHC: 30.9 g/dL — AB (ref 31.5–36.0)
MCV: 97 fL (ref 79.5–101.0)
MONO#: 0.6 10*3/uL (ref 0.1–0.9)
MONO%: 14.1 % — ABNORMAL HIGH (ref 0.0–14.0)
NEUT#: 2.4 10*3/uL (ref 1.5–6.5)
NEUT%: 59 % (ref 38.4–76.8)
Platelets: 88 10*3/uL — ABNORMAL LOW (ref 145–400)
RBC: 2.3 10*6/uL — AB (ref 3.70–5.45)
RDW: 20.1 % — ABNORMAL HIGH (ref 11.2–14.5)
WBC: 4.1 10*3/uL (ref 3.9–10.3)
nRBC: 2 % — ABNORMAL HIGH (ref 0–0)

## 2014-09-01 LAB — COMPREHENSIVE METABOLIC PANEL (CC13)
ALBUMIN: 2.8 g/dL — AB (ref 3.5–5.0)
ALT: 35 U/L (ref 0–55)
ANION GAP: 14 meq/L — AB (ref 3–11)
AST: 80 U/L — AB (ref 5–34)
Alkaline Phosphatase: 204 U/L — ABNORMAL HIGH (ref 40–150)
BILIRUBIN TOTAL: 0.6 mg/dL (ref 0.20–1.20)
BUN: 20.6 mg/dL (ref 7.0–26.0)
CO2: 20 meq/L — AB (ref 22–29)
CREATININE: 1.1 mg/dL (ref 0.6–1.1)
Calcium: 8.6 mg/dL (ref 8.4–10.4)
Chloride: 104 mEq/L (ref 98–109)
EGFR: 51 mL/min/{1.73_m2} — ABNORMAL LOW (ref >90)
GLUCOSE: 111 mg/dL (ref 70–140)
POTASSIUM: 3.9 meq/L (ref 3.5–5.1)
SODIUM: 138 meq/L (ref 136–145)
TOTAL PROTEIN: 6.8 g/dL (ref 6.4–8.3)

## 2014-09-01 LAB — HOLD TUBE, BLOOD BANK

## 2014-09-01 LAB — ABO/RH: ABO/RH(D): A POS

## 2014-09-01 MED ORDER — DARBEPOETIN ALFA 200 MCG/0.4ML IJ SOSY
200.0000 ug | PREFILLED_SYRINGE | Freq: Once | INTRAMUSCULAR | Status: AC
  Administered 2014-09-01: 200 ug via SUBCUTANEOUS
  Filled 2014-09-01: qty 0.4

## 2014-09-01 NOTE — Progress Notes (Signed)
Patient's HGB came back at 6.9 today. Per Dr. Jana Hakim, I have scheduled patient to receive two units of blood tomorrow, 3/31 @ 8:30 am, at [facility]. Patient verbalized understanding of date and time of appointment and to leave the blue bracelet on. Orders placed.

## 2014-09-02 ENCOUNTER — Ambulatory Visit (HOSPITAL_COMMUNITY)
Admission: RE | Admit: 2014-09-02 | Discharge: 2014-09-02 | Disposition: A | Discharge status: Home / Self Care | Payer: Medicare Other | Source: Ambulatory Visit | Attending: Oncology | Admitting: Oncology

## 2014-09-02 VITALS — BP 133/66 | HR 80 | Temp 97.7°F | Resp 16

## 2014-09-02 DIAGNOSIS — C50412 Malignant neoplasm of upper-outer quadrant of left female breast: Secondary | ICD-10-CM | POA: Diagnosis not present

## 2014-09-02 DIAGNOSIS — D508 Other iron deficiency anemias: Secondary | ICD-10-CM

## 2014-09-02 LAB — PREPARE RBC (CROSSMATCH)

## 2014-09-02 MED ORDER — SODIUM CHLORIDE 0.9 % IV SOLN: 250.0000 mL | Freq: Once | INTRAVENOUS | Status: DC

## 2014-09-02 MED ORDER — SODIUM CHLORIDE 0.9 % IJ SOLN: 10.0000 mL | INTRAMUSCULAR | Status: DC | PRN

## 2014-09-02 MED ORDER — HEPARIN SOD (PORK) LOCK FLUSH 100 UNIT/ML IV SOLN: 500.0000 [IU] | Freq: Every day | INTRAVENOUS | Status: DC | PRN

## 2014-09-02 MED ORDER — HEPARIN SOD (PORK) LOCK FLUSH 100 UNIT/ML IV SOLN: 250.0000 [IU] | INTRAVENOUS | Status: DC | PRN

## 2014-09-02 MED ORDER — SODIUM CHLORIDE 0.9 % IJ SOLN: 3.0000 mL | INTRAMUSCULAR | Status: DC | PRN

## 2014-09-02 MED ORDER — ACETAMINOPHEN 325 MG PO TABS
650.0000 mg | ORAL_TABLET | Freq: Once | ORAL | Status: AC
  Administered 2014-09-02: 650 mg via ORAL
  Filled 2014-09-02: qty 2

## 2014-09-02 MED ORDER — DIPHENHYDRAMINE HCL 25 MG PO CAPS
25.0000 mg | ORAL_CAPSULE | Freq: Once | ORAL | Status: AC
  Administered 2014-09-02: 25 mg via ORAL
  Filled 2014-09-02: qty 1

## 2014-09-02 NOTE — Procedures (Signed)
[facility]  Procedure Note  TAKIERA MAYO GHW:[id number] DOB: 07/30/1945 DOA: 09/02/2014   Dr. Jana Hakim  Associated Diagnosis: D50. Other iron deficiency anemia  Procedure Note: IV started, pre medications given, consent obtained, 2 Units PRBC's transfused per order, IV discontinued   Condition During Procedure:  Patient stable, denied any discomforts or signs of reaction   Condition at Discharge:  Patient stable, husband at side for transport.   Roberto Scales, RN  [facility]

## 2014-09-03 LAB — TYPE AND SCREEN
ABO/RH(D): A POS
Antibody Screen: NEGATIVE
UNIT DIVISION: 0
Unit division: 0

## 2014-09-06 ENCOUNTER — Ambulatory Visit (HOSPITAL_COMMUNITY)
Admission: RE | Admit: 2014-09-06 | Discharge: 2014-09-06 | Disposition: A | Discharge status: Home / Self Care | Payer: Medicare Other | Source: Ambulatory Visit | Attending: Oncology | Admitting: Oncology

## 2014-09-06 ENCOUNTER — Other Ambulatory Visit: Payer: Self-pay | Admitting: Oncology

## 2014-09-06 DIAGNOSIS — C50919 Malignant neoplasm of unspecified site of unspecified female breast: Secondary | ICD-10-CM

## 2014-09-06 DIAGNOSIS — C7951 Secondary malignant neoplasm of bone: Secondary | ICD-10-CM

## 2014-09-06 DIAGNOSIS — C7931 Secondary malignant neoplasm of brain: Secondary | ICD-10-CM

## 2014-09-06 DIAGNOSIS — Z853 Personal history of malignant neoplasm of breast: Secondary | ICD-10-CM | POA: Diagnosis not present

## 2014-09-06 DIAGNOSIS — D630 Anemia in neoplastic disease: Secondary | ICD-10-CM

## 2014-09-06 DIAGNOSIS — C787 Secondary malignant neoplasm of liver and intrahepatic bile duct: Secondary | ICD-10-CM | POA: Diagnosis not present

## 2014-09-06 MED ORDER — GADOBENATE DIMEGLUMINE 529 MG/ML IV SOLN
10.0000 mL | Freq: Once | INTRAVENOUS | Status: AC | PRN
  Administered 2014-09-06: 10 mL via INTRAVENOUS

## 2014-09-07 ENCOUNTER — Telehealth: Payer: Self-pay | Admitting: Oncology

## 2014-09-07 NOTE — Telephone Encounter (Signed)
per Gm to move from HB to GM sch 4/7

## 2014-09-08 ENCOUNTER — Ambulatory Visit: Payer: Medicare Other

## 2014-09-08 ENCOUNTER — Other Ambulatory Visit: Payer: Medicare Other

## 2014-09-09 ENCOUNTER — Ambulatory Visit (HOSPITAL_BASED_OUTPATIENT_CLINIC_OR_DEPARTMENT_OTHER): Payer: Medicare Other | Admitting: Oncology

## 2014-09-09 ENCOUNTER — Ambulatory Visit: Payer: Medicare Other

## 2014-09-09 ENCOUNTER — Telehealth: Payer: Self-pay | Admitting: Oncology

## 2014-09-09 ENCOUNTER — Ambulatory Visit: Payer: Medicare Other | Admitting: Nurse Practitioner

## 2014-09-09 ENCOUNTER — Other Ambulatory Visit: Payer: Medicare Other

## 2014-09-09 ENCOUNTER — Other Ambulatory Visit (HOSPITAL_BASED_OUTPATIENT_CLINIC_OR_DEPARTMENT_OTHER): Payer: Medicare Other

## 2014-09-09 VITALS — BP 143/80 | HR 105 | Temp 98.0°F | Resp 18 | Ht 65.0 in | Wt 107.2 lb

## 2014-09-09 DIAGNOSIS — C787 Secondary malignant neoplasm of liver and intrahepatic bile duct: Secondary | ICD-10-CM | POA: Diagnosis not present

## 2014-09-09 DIAGNOSIS — C7951 Secondary malignant neoplasm of bone: Secondary | ICD-10-CM

## 2014-09-09 DIAGNOSIS — C7931 Secondary malignant neoplasm of brain: Secondary | ICD-10-CM

## 2014-09-09 DIAGNOSIS — R040 Epistaxis: Secondary | ICD-10-CM | POA: Diagnosis not present

## 2014-09-09 DIAGNOSIS — D631 Anemia in chronic kidney disease: Secondary | ICD-10-CM

## 2014-09-09 DIAGNOSIS — N189 Chronic kidney disease, unspecified: Secondary | ICD-10-CM | POA: Diagnosis not present

## 2014-09-09 DIAGNOSIS — N183 Chronic kidney disease, stage 3 unspecified: Secondary | ICD-10-CM

## 2014-09-09 DIAGNOSIS — D630 Anemia in neoplastic disease: Secondary | ICD-10-CM

## 2014-09-09 DIAGNOSIS — C50919 Malignant neoplasm of unspecified site of unspecified female breast: Secondary | ICD-10-CM

## 2014-09-09 DIAGNOSIS — Z170 Estrogen receptor positive status [ER+]: Secondary | ICD-10-CM

## 2014-09-09 DIAGNOSIS — C773 Secondary and unspecified malignant neoplasm of axilla and upper limb lymph nodes: Secondary | ICD-10-CM | POA: Diagnosis not present

## 2014-09-09 DIAGNOSIS — C50912 Malignant neoplasm of unspecified site of left female breast: Secondary | ICD-10-CM

## 2014-09-09 DIAGNOSIS — C50412 Malignant neoplasm of upper-outer quadrant of left female breast: Secondary | ICD-10-CM

## 2014-09-09 DIAGNOSIS — R634 Abnormal weight loss: Secondary | ICD-10-CM | POA: Diagnosis not present

## 2014-09-09 DIAGNOSIS — N1330 Unspecified hydronephrosis: Secondary | ICD-10-CM

## 2014-09-09 LAB — CBC WITH DIFFERENTIAL/PLATELET
BASO%: 0.7 % (ref 0.0–2.0)
BASOS ABS: 0 10*3/uL (ref 0.0–0.1)
EOS ABS: 0.1 10*3/uL (ref 0.0–0.5)
EOS%: 2.3 % (ref 0.0–7.0)
HCT: 31.6 % — ABNORMAL LOW (ref 34.8–46.6)
HEMOGLOBIN: 9.9 g/dL — AB (ref 11.6–15.9)
LYMPH%: 17.5 % (ref 14.0–49.7)
MCH: 29.7 pg (ref 25.1–34.0)
MCHC: 31.3 g/dL — ABNORMAL LOW (ref 31.5–36.0)
MCV: 94.9 fL (ref 79.5–101.0)
MONO#: 0.4 10*3/uL (ref 0.1–0.9)
MONO%: 12.2 % (ref 0.0–14.0)
NEUT%: 67.3 % (ref 38.4–76.8)
NEUTROS ABS: 2 10*3/uL (ref 1.5–6.5)
Platelets: 88 10*3/uL — ABNORMAL LOW (ref 145–400)
RBC: 3.33 10*6/uL — AB (ref 3.70–5.45)
RDW: 19.3 % — AB (ref 11.2–14.5)
WBC: 3 10*3/uL — ABNORMAL LOW (ref 3.9–10.3)
lymph#: 0.5 10*3/uL — ABNORMAL LOW (ref 0.9–3.3)

## 2014-09-09 LAB — COMPREHENSIVE METABOLIC PANEL (CC13)
ALBUMIN: 2.9 g/dL — AB (ref 3.5–5.0)
ALT: 44 U/L (ref 0–55)
ANION GAP: 12 meq/L — AB (ref 3–11)
AST: 97 U/L — ABNORMAL HIGH (ref 5–34)
Alkaline Phosphatase: 246 U/L — ABNORMAL HIGH (ref 40–150)
BUN: 16.8 mg/dL (ref 7.0–26.0)
CALCIUM: 8.5 mg/dL (ref 8.4–10.4)
CHLORIDE: 105 meq/L (ref 98–109)
CO2: 20 meq/L — AB (ref 22–29)
Creatinine: 1 mg/dL (ref 0.6–1.1)
EGFR: 60 mL/min/{1.73_m2} — ABNORMAL LOW (ref >90)
Glucose: 124 mg/dl (ref 70–140)
POTASSIUM: 3.5 meq/L (ref 3.5–5.1)
Sodium: 137 mEq/L (ref 136–145)
Total Bilirubin: 0.74 mg/dL (ref 0.20–1.20)
Total Protein: 7 g/dL (ref 6.4–8.3)

## 2014-09-09 MED ORDER — LORATADINE 10 MG PO TABS: 10.0000 mg | ORAL_TABLET | Freq: Every day | ORAL | Status: DC

## 2014-09-09 NOTE — Telephone Encounter (Signed)
per pof ot sch pt appt-cld & spoke to pt and adv of time & date of appt

## 2014-09-09 NOTE — Progress Notes (Signed)
[place]  [phone] [phone]     ID: [name] OB: 01/05/1946  MR#: [id number]  EZM#:[id number]  PCP: Lilian Coma, MD GYN:   SU: Excell Seltzer OTHER MD: Arloa Koh, Altamese Cabal, Scott Minor DDS, Tyler Pita, Franchot Gallo, Consuella Lose  CHIEF COMPLAINT: stage IV breast cancer, estrogen receptor positive  TREATMENT: exemestane, everolimus and denosumab  BREAST CANCER HISTORY: From the original intake note 11/04/2013:  Celie noted a change in her left breast approximately mid 2014. This was a flattening of the lateral aspect of the left breast. Eventually she noted an indentation and eventually a "sore" in that area. She had not shared this information with her husband, and she did not have a primary care physician. More recently, as she started to have symptoms related to this, she mentioned it to her husband [name] and she establish yourself with Dr. Stephanie Acre, who set her up for bilateral diagnostic mammography and ultrasonography at the [facility] 10/23/2013.. This showed a breast density to be category C. The right breast was negative.  The left breast contained an ulcerated mass laterally measuring at least 4 cm mammography. On physical exam there was an ulcerated mass in the 2:00 location of the left breast, associated with erythematous skin nodules in the upper inner quadrant. The left breast was smaller than the right, and firm. There were palpable left axillary lymph nodes. In addition, on the right upper chest wall, infraclavicular like, there was a firm nodule separate from the bony structures. There was also a right preauricular node and a firm left submandibular node.  Ultrasound showed multiple nodules throughout the lower portion of the left breast. There was a large mass in the upper outer quadrant of the left breast contiguous with the skin ulceration, measuring greater than 5 cm. The left axilla  showed at least 3 suspicious masses, the largest measuring 1.9 cm. Ultrasound of the palpable abnormality of the right chest wall showed a superficial irregular mass, subdermal, measuring 0.8 cm.  Biopsies of the left breast mass, a left axillary lymph node, and the right infra-clavicular mass on 10/23/2013, all showed ([id number]) and invasive ductal carcinoma, grade 1 or 2, estrogen receptor 100% positive, progesterone receptor 23% positive, both with strong staining intensity, with an MIB-1 of 61%, and no HER-2 amplification, the signals ratio being 0.90, and the number per cell 1.80.  On 11/02/2013 the patient underwent bilateral breast MRI and this showed, in the left breast, a 5.8 cm mass eroding through the overlying skin, and also invading the underlying pectoralis muscle and chest wall. There were multiple enhancing nodules throughout the left breast and an adjacent 4.8 cm irregular enhancing mass inferiorly. There are multiple large irregular left axillary lymph nodes, the largest measuring 2.4 cm. There was a 0.9 cm left subpectoral lymph node as well as prevascular and precarinal lymph nodes.  In the right breast far upper inner quadrant there was a 0.8 cm irregular enhancing mass abutting the overlying skin. This is the mass that was biopsied and shown to be positive. In addition there was patchy heterogeneous enhancement of the sternum.  The patient's subsequent history is as detailed below  INTERVAL HISTORY: Galilee returns today for follow-up of her metastatic breast cancer accompanied by her husband, [name].  She continues on exemestane and everolimus and is tolerating those drugs with no side effects that she is aware of. She does have poor appetite, which she attributes to the everolimus, but it is more  likely due to her liver involvement by cancer. Since her last visit here she has had a restaging MRI of the liver. This shows a mixed response, with some lesions shrinking by a few  millimeters and some lesions growing by few millimeters.  REVIEW OF SYSTEMS: Appetite is a major issue. She just does not feel like eating. Taste is less of a problem. She is trying to add ice cream in between meals. [name] is doing most of the cooking. She also has problems with epistaxis. This can be moderately severe. She has had an aspirin a day she does not know why and we are going to stop that and see if that helps. She does not have significant sinus symptoms but I think it might be worthwhile to try loratadine and see if we can get ahead of the epistaxis problem. She felt very fatigued until she had her blood transfusion last week and then she did 7 loads of laundry. She has problems with her dentures at times, she feels fatigued and has shortness of breath, but overall she is maintaining a fair functional status considering the extent of her metastatic disease. Weight is a major issue a detailed review of systems is otherwise stable  PAST MEDICAL HISTORY: Past Medical History  Diagnosis Date  . Urinary tract infection   . Anemia   . Breast cancer     T3N2M1 stage IV invasive ductal carcinoma   . Brain metastases   . Hydronephrosis, left   . Lung metastases   . Bone metastases     PAST SURGICAL HISTORY: Past Surgical History  Procedure Laterality Date  . Cataract extraction  2013  . Mouth surgery  1981    FAMILY HISTORY: The patient's father died at the age of 2, from pneumonia. The patient's mother lived to be 64. The patient had no brothers, one sister. There is no history of breast or ovarian cancer in the family  GYNECOLOGIC HISTORY:  Menarche age 35. The patient is GX P0. She stopped having periods approximately 2003. She did not take hormone replacement. She used birth control pills for approximately 8 years remotely, with no complications   SOCIAL HISTORY:  Briony retired about a year ago. 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 Her  husband [name] is retired from working in Charity fundraiser. They live alone, with no pets.    ADVANCED DIRECTIVES: Not in place   HEALTH MAINTENANCE: History  Substance Use Topics  . Smoking status: Never Smoker   . Smokeless tobacco: Never Used  . Alcohol Use: Yes     Comment: 10 12 glasses/week     Colonoscopy: Never  PAP: 1990  Bone density: Never  Lipid panel:  No Known Allergies  Current Outpatient Prescriptions  Medication Sig Dispense Refill  . ALPRAZolam (XANAX) 0.5 MG tablet Take 1 tablet (0.5 mg total) by mouth at bedtime as needed for anxiety. (Patient not taking: Reported on 08/09/2014) 10 tablet 0  . aspirin 81 MG EC tablet Take 81 mg by mouth daily. Swallow whole.    . calcium carbonate (TUMS - DOSED IN MG ELEMENTAL CALCIUM) 500 MG chewable tablet Chew 1 tablet by mouth daily.    . darbepoetin (ARANESP) 100 MCG/0.5ML SOLN injection Inject 100 mcg into the skin.    Marland Kitchen denosumab (XGEVA) 120 MG/1.7ML SOLN injection Inject 120 mg into the skin every 28 (twenty-eight) days.    Marland Kitchen everolimus (AFINITOR) 10 MG tablet Take 1 tablet (10 mg total) by  mouth daily. 30 tablet 12  . exemestane (AROMASIN) 25 MG tablet Take 1 tablet (25 mg total) by mouth daily after breakfast. 30 tablet 12  . LORazepam (ATIVAN) 0.5 MG tablet      No current facility-administered medications for this visit.    OBJECTIVE: Middle-aged white woman who appears stated age 61 Vitals:   09/09/14 0849  BP: 143/80  Pulse: 105  Temp: 98 F (36.7 C)  Resp: 18     Body mass index is 17.84 kg/(m^2).    ECOG FS:1 - Symptomatic but completely ambulatory  Sclerae unicteric, pupils equal and reactive Oropharynx clear, full upper plate No cervical or supraclavicular adenopathy Lungs no rales or rhonchi Heart regular rate and rhythm Abd soft, nontender, positive bowel sounds MSK no focal spinal tenderness, no upper extremity lymphedema Neuro: nonfocal, well oriented, appropriate affect Breasts: Deferred   LAB  RESULTS:  CMP     Component Value Date/Time   NA 137 09/09/2014 0825   K 3.5 09/09/2014 0825   CO2 20* 09/09/2014 0825   GLUCOSE 124 09/09/2014 0825   BUN 16.8 09/09/2014 0825   CREATININE 1.0 09/09/2014 0825   CALCIUM 8.5 09/09/2014 0825   PROT 7.0 09/09/2014 0825   ALBUMIN 2.9* 09/09/2014 0825   AST 97* 09/09/2014 0825   ALT 44 09/09/2014 0825   ALKPHOS 246* 09/09/2014 0825   BILITOT 0.74 09/09/2014 0825    I No results found for: SPEP  Lab Results  Component Value Date   WBC 3.0* 09/09/2014   NEUTROABS 2.0 09/09/2014   HGB 9.9* 09/09/2014   HCT 31.6* 09/09/2014   MCV 94.9 09/09/2014   PLT 88* 09/09/2014      Chemistry      Component Value Date/Time   NA 137 09/09/2014 0825   K 3.5 09/09/2014 0825   CO2 20* 09/09/2014 0825   BUN 16.8 09/09/2014 0825   CREATININE 1.0 09/09/2014 0825      Component Value Date/Time   CALCIUM 8.5 09/09/2014 0825   ALKPHOS 246* 09/09/2014 0825   AST 97* 09/09/2014 0825   ALT 44 09/09/2014 0825   BILITOT 0.74 09/09/2014 0825       No results found for: LABCA2  No components found for: LABCA125  No results for input(s): INR in the last 168 hours.  Urinalysis No results found for: COLORURINE  STUDIES: Mr Liver W Wo Contrast  09-14-14   CLINICAL DATA:  History of breast cancer with liver metastasis. Evaluate for disease progression.  EXAM: MRI ABDOMEN WITHOUT AND WITH CONTRAST  TECHNIQUE: Multiplanar multisequence MR imaging of the abdomen was performed both before and after the administration of intravenous contrast.  CONTRAST:  58m MULTIHANCE GADOBENATE DIMEGLUMINE 529 MG/ML IV SOLN  COMPARISON:  06/02/2014  FINDINGS: Lower chest:  Very small bilateral pleural effusions are noted.  Hepatobiliary: Innumerable liver metastasis are identified in both lobes. Index lesion in the left hepatic lobe measures 1.4 x 2.4 cm, image 24/series 5. On the previous exam this measured 2.4 x 2.9 cm. The index lesion in the right hepatic lobe  measures 3.1 x 2.7 cm, image 18 series 5. This is compared with 3.0 x 2.9 cm previously. Index lesion within the anterior lateral right hepatic lobe measures 2.6 x 2.4 cm, image 27/series 5. Previously 2.5 x 2.0 cm. Also larger is a lesion in the central right hepatic lobe measuring 2.2 x 2.8 cm, image 13/series 5. Previously 2.1 x 2.3 cm.  There is new diffuse gallbladder wall thickening and edema.  The common bile duct has a normal caliber. No significant intrahepatic bile duct dilatation.  Pancreas: Negative.  Spleen: Negative.  Adrenals/Urinary Tract: The adrenal glands are negative. Normal appearance of the left kidney. There is asymmetric atrophy of the right kidney, similar to previous exam.  Stomach/Bowel: The upper abdominal bowel loops are unremarkable.  Vascular/Lymphatic: Normal caliber of the abdominal aorta. No upper abdominal adenopathy identified.  Other: There is no free fluid or fluid collections within the upper abdomen.  Musculoskeletal: Diffuse bone metastasis again noted.  IMPRESSION: 1. Again noted are multi focal liver metastasis. Compared with previous exam today's study reflects a mixed response to therapy. The index lesion in the left lobe of liver has decreased in the interval. The previous index lesion in right lobe of liver has remained stable. However, there are other lesions in the right lobe of liver (referenced above) which have increased in size.   Electronically Signed   By: Kerby Moors M.D.   On: 09/06/2014 11:10     ASSESSMENT: 69 y.o. [place] woman status post left breast and left axillary lymph node biopsy as well as right infraclavicular chest wall biopsy 10/23/2013 for a clinical T3, N2, M1, stage IV invasive ductal carcinoma, grade 2, estrogen receptor 100% positive, progesterone receptor 23% positive, with an MIB-1 of 61%, and no HER-2 amplification.  (1) staging studies 11/05/2013 showed metastatic involvement of bone, liver, lungs, and brain  (2)  antiestrogen therapy with letrozole and fulvestrant as well as targeted therapy with palbociclib started 11/11/2013, discontinued January 2015 with progression  (3) denosumab monthly started 11/11/2013  (4) s/p stereotactic radiosurgery to brain metastases 03/19/2014 - Left lateral cerebellar 4 mm to 20 Gy.   - Left medial cerebellar 3 mm to 20 Gy  (5) Right hydronephrosis w/o obstruction-- followed by urology ([facility])--resolved as of abd MRI 06/02/2014  (6) exemestane and everolimus started 06/07/2014  (7) s/p further stereotactic radiosurgery 07/05/2014  R parietal 3 mm target: 20 Gy  R parietal 2 mm target: 20 Gy  (8) anemia of CKD;  started darbepoietin 08/04/2014; dose increased 09/15/2014  PLAN: Shanyla is having a mixed response to the combination of exemestane and everolimus. She is actually tolerating them well. I do not have a better antiestrogen combination than that, so if we move away from that we are going to go to chemotherapy. She is extremely reluctant to do that at this point.  I think there is a small possibility that if we persist with this treatment the areas that were growing we'll stop growing. There can be a delayed response. For that reason I have set her up for repeat MRI of the liver the first week in June. She will see me a few days later. If there has been continued growth, even if only in part of the cancer, we will have to consider chemotherapy at that point  Of course she already is scheduled for repeat brain MRI in May. She will follow-up with Dr. Tammi Klippel regarding that portion of her cancer  I am opting the Aranesp to 300 g and I will start following her reticulocyte response on a monthly basis. She will be transfused as needed if her hemoglobin drops below 9 or she develops symptoms.  She has continued to lose some weight. Appetite is a real problem, more so than altered taste. We can always start Megace. She will let me know if she loses any  more weight at all and that is what we would implement at that  time  Finally as far as her epistaxis is concerned we are stopping her aspirin and adding loratadine.  Anusha has a good understanding of the overall plan. She agrees with it. She knows the goal of treatment in her case is control. She will call with any problems that may develop before her next visit here.  Chauncey Cruel, MD

## 2014-09-15 ENCOUNTER — Ambulatory Visit (HOSPITAL_BASED_OUTPATIENT_CLINIC_OR_DEPARTMENT_OTHER): Payer: Medicare Other

## 2014-09-15 ENCOUNTER — Other Ambulatory Visit (HOSPITAL_BASED_OUTPATIENT_CLINIC_OR_DEPARTMENT_OTHER): Payer: Medicare Other

## 2014-09-15 VITALS — BP 133/80 | HR 96 | Temp 98.0°F

## 2014-09-15 DIAGNOSIS — C7951 Secondary malignant neoplasm of bone: Secondary | ICD-10-CM

## 2014-09-15 DIAGNOSIS — C7931 Secondary malignant neoplasm of brain: Secondary | ICD-10-CM | POA: Diagnosis not present

## 2014-09-15 DIAGNOSIS — C787 Secondary malignant neoplasm of liver and intrahepatic bile duct: Secondary | ICD-10-CM

## 2014-09-15 DIAGNOSIS — C50919 Malignant neoplasm of unspecified site of unspecified female breast: Secondary | ICD-10-CM

## 2014-09-15 DIAGNOSIS — N1330 Unspecified hydronephrosis: Secondary | ICD-10-CM

## 2014-09-15 DIAGNOSIS — N189 Chronic kidney disease, unspecified: Secondary | ICD-10-CM

## 2014-09-15 DIAGNOSIS — C50412 Malignant neoplasm of upper-outer quadrant of left female breast: Secondary | ICD-10-CM

## 2014-09-15 DIAGNOSIS — D631 Anemia in chronic kidney disease: Secondary | ICD-10-CM

## 2014-09-15 DIAGNOSIS — D630 Anemia in neoplastic disease: Secondary | ICD-10-CM

## 2014-09-15 DIAGNOSIS — C773 Secondary and unspecified malignant neoplasm of axilla and upper limb lymph nodes: Secondary | ICD-10-CM | POA: Diagnosis not present

## 2014-09-15 DIAGNOSIS — C50912 Malignant neoplasm of unspecified site of left female breast: Secondary | ICD-10-CM

## 2014-09-15 LAB — CBC & DIFF AND RETIC
BASO%: 0.6 % (ref 0.0–2.0)
Basophils Absolute: 0 10*3/uL (ref 0.0–0.1)
EOS ABS: 0.1 10*3/uL (ref 0.0–0.5)
EOS%: 2.7 % (ref 0.0–7.0)
HCT: 29.5 % — ABNORMAL LOW (ref 34.8–46.6)
HGB: 9.3 g/dL — ABNORMAL LOW (ref 11.6–15.9)
Immature Retic Fract: 12.3 % — ABNORMAL HIGH (ref 1.60–10.00)
LYMPH%: 22.4 % (ref 14.0–49.7)
MCH: 29.8 pg (ref 25.1–34.0)
MCHC: 31.5 g/dL (ref 31.5–36.0)
MCV: 94.6 fL (ref 79.5–101.0)
MONO#: 0.5 10*3/uL (ref 0.1–0.9)
MONO%: 16.4 % — AB (ref 0.0–14.0)
NEUT#: 1.9 10*3/uL (ref 1.5–6.5)
NEUT%: 57.9 % (ref 38.4–76.8)
PLATELETS: 100 10*3/uL — AB (ref 145–400)
RBC: 3.12 10*6/uL — AB (ref 3.70–5.45)
RDW: 19 % — AB (ref 11.2–14.5)
RETIC CT ABS: 71.76 10*3/uL (ref 33.70–90.70)
Retic %: 2.3 % — ABNORMAL HIGH (ref 0.70–2.10)
WBC: 3.3 10*3/uL — ABNORMAL LOW (ref 3.9–10.3)
lymph#: 0.7 10*3/uL — ABNORMAL LOW (ref 0.9–3.3)

## 2014-09-15 LAB — COMPREHENSIVE METABOLIC PANEL
ALT: 41 U/L — AB (ref 0–35)
AST: 83 U/L — ABNORMAL HIGH (ref 0–37)
Albumin: 3.4 g/dL — ABNORMAL LOW (ref 3.5–5.2)
Alkaline Phosphatase: 238 U/L — ABNORMAL HIGH (ref 39–117)
BILIRUBIN TOTAL: 0.6 mg/dL (ref 0.2–1.2)
BUN: 17 mg/dL (ref 6–23)
CALCIUM: 8.9 mg/dL (ref 8.4–10.5)
CHLORIDE: 101 meq/L (ref 96–112)
CO2: 22 mEq/L (ref 19–32)
CREATININE: 0.93 mg/dL (ref 0.50–1.10)
GLUCOSE: 130 mg/dL — AB (ref 70–99)
Potassium: 4 mEq/L (ref 3.5–5.3)
Sodium: 134 mEq/L — ABNORMAL LOW (ref 135–145)
Total Protein: 6.6 g/dL (ref 6.0–8.3)

## 2014-09-15 MED ORDER — DARBEPOETIN ALFA 300 MCG/0.6ML IJ SOSY
300.0000 ug | PREFILLED_SYRINGE | Freq: Once | INTRAMUSCULAR | Status: AC
  Administered 2014-09-15: 300 ug via SUBCUTANEOUS
  Filled 2014-09-15: qty 0.6

## 2014-09-15 MED ORDER — DENOSUMAB 120 MG/1.7ML ~~LOC~~ SOLN
120.0000 mg | Freq: Once | SUBCUTANEOUS | Status: AC
  Administered 2014-09-15: 120 mg via SUBCUTANEOUS
  Filled 2014-09-15: qty 1.7

## 2014-09-15 NOTE — Patient Instructions (Signed)
Denosumab injection What is this medicine? DENOSUMAB (den oh sue mab) slows bone breakdown. Prolia is used to treat osteoporosis in women after menopause and in men. Xgeva is used to prevent bone fractures and other bone problems caused by cancer bone metastases. Xgeva is also used to treat giant cell tumor of the bone. This medicine may be used for other purposes; ask your health care provider or pharmacist if you have questions. COMMON BRAND NAME(S): Prolia, XGEVA What should I tell my health care provider before I take this medicine? They need to know if you have any of these conditions: -dental disease -eczema -infection or history of infections -kidney disease or on dialysis -low blood calcium or vitamin D -malabsorption syndrome -scheduled to have surgery or tooth extraction -taking medicine that contains denosumab -thyroid or parathyroid disease -an unusual reaction to denosumab, other medicines, foods, dyes, or preservatives -pregnant or trying to get pregnant -breast-feeding How should I use this medicine? This medicine is for injection under the skin. It is given by a health care professional in a hospital or clinic setting. If you are getting Prolia, a special MedGuide will be given to you by the pharmacist with each prescription and refill. Be sure to read this information carefully each time. For Prolia, talk to your pediatrician regarding the use of this medicine in children. Special care may be needed. For Xgeva, talk to your pediatrician regarding the use of this medicine in children. While this drug may be prescribed for children as young as 13 years for selected conditions, precautions do apply. Overdosage: If you think you've taken too much of this medicine contact a poison control center or emergency room at once. Overdosage: If you think you have taken too much of this medicine contact a poison control center or emergency room at once. NOTE: This medicine is only for  you. Do not share this medicine with others. What if I miss a dose? It is important not to miss your dose. Call your doctor or health care professional if you are unable to keep an appointment. What may interact with this medicine? Do not take this medicine with any of the following medications: -other medicines containing denosumab This medicine may also interact with the following medications: -medicines that suppress the immune system -medicines that treat cancer -steroid medicines like prednisone or cortisone This list may not describe all possible interactions. Give your health care provider a list of all the medicines, herbs, non-prescription drugs, or dietary supplements you use. Also tell them if you smoke, drink alcohol, or use illegal drugs. Some items may interact with your medicine. What should I watch for while using this medicine? Visit your doctor or health care professional for regular checks on your progress. Your doctor or health care professional may order blood tests and other tests to see how you are doing. Call your doctor or health care professional if you get a cold or other infection while receiving this medicine. Do not treat yourself. This medicine may decrease your body's ability to fight infection. You should make sure you get enough calcium and vitamin D while you are taking this medicine, unless your doctor tells you not to. Discuss the foods you eat and the vitamins you take with your health care professional. See your dentist regularly. Brush and floss your teeth as directed. Before you have any dental work done, tell your dentist you are receiving this medicine. Do not become pregnant while taking this medicine or for 5 months after stopping   it. Women should inform their doctor if they wish to become pregnant or think they might be pregnant. There is a potential for serious side effects to an unborn child. Talk to your health care professional or pharmacist for more  information. What side effects may I notice from receiving this medicine? Side effects that you should report to your doctor or health care professional as soon as possible: -allergic reactions like skin rash, itching or hives, swelling of the face, lips, or tongue -breathing problems -chest pain -fast, irregular heartbeat -feeling faint or lightheaded, falls -fever, chills, or any other sign of infection -muscle spasms, tightening, or twitches -numbness or tingling -skin blisters or bumps, or is dry, peels, or red -slow healing or unexplained pain in the mouth or jaw -unusual bleeding or bruising Side effects that usually do not require medical attention (Report these to your doctor or health care professional if they continue or are bothersome.): -muscle pain -stomach upset, gas This list may not describe all possible side effects. Call your doctor for medical advice about side effects. You may report side effects to FDA at 1-800-FDA-1088. Where should I keep my medicine? This medicine is only given in a clinic, doctor's office, or other health care setting and will not be stored at home. NOTE: This sheet is a summary. It may not cover all possible information. If you have questions about this medicine, talk to your doctor, pharmacist, or health care provider.  2015, [employer] Standard. (2011-11-19 12:37:47) Darbepoetin Alfa injection What is this medicine? DARBEPOETIN ALFA (dar be POE e tin AL fa) helps your body make more red blood cells. It is used to treat anemia caused by chronic kidney failure and chemotherapy. This medicine may be used for other purposes; ask your health care provider or pharmacist if you have questions. COMMON BRAND NAME(S): Aranesp What should I tell my health care provider before I take this medicine? They need to know if you have any of these conditions: -blood clotting disorders or history of blood clots -cancer patient not on chemotherapy -cystic  fibrosis -heart disease, such as angina, heart failure, or a history of a heart attack -hemoglobin level of 12 g/dL or greater -high blood pressure -low levels of folate, iron, or vitamin B12 -seizures -an unusual or allergic reaction to darbepoetin, erythropoietin, albumin, hamster proteins, latex, other medicines, foods, dyes, or preservatives -pregnant or trying to get pregnant -breast-feeding How should I use this medicine? This medicine is for injection into a vein or under the skin. It is usually given by a health care professional in a hospital or clinic setting. If you get this medicine at home, you will be taught how to prepare and give this medicine. Do not shake the solution before you withdraw a dose. Use exactly as directed. Take your medicine at regular intervals. Do not take your medicine more often than directed. It is important that you put your used needles and syringes in a special sharps container. Do not put them in a trash can. If you do not have a sharps container, call your pharmacist or healthcare provider to get one. Talk to your pediatrician regarding the use of this medicine in children. While this medicine may be used in children as young as 1 year for selected conditions, precautions do apply. Overdosage: If you think you have taken too much of this medicine contact a poison control center or emergency room at once. NOTE: This medicine is only for you. Do not share this medicine with  others. What if I miss a dose? If you miss a dose, take it as soon as you can. If it is almost time for your next dose, take only that dose. Do not take double or extra doses. What may interact with this medicine? Do not take this medicine with any of the following medications: -epoetin alfa This list may not describe all possible interactions. Give your health care provider a list of all the medicines, herbs, non-prescription drugs, or dietary supplements you use. Also tell them if you  smoke, drink alcohol, or use illegal drugs. Some items may interact with your medicine. What should I watch for while using this medicine? Visit your prescriber or health care professional for regular checks on your progress and for the needed blood tests and blood pressure measurements. It is especially important for the doctor to make sure your hemoglobin level is in the desired range, to limit the risk of potential side effects and to give you the best benefit. Keep all appointments for any recommended tests. Check your blood pressure as directed. Ask your doctor what your blood pressure should be and when you should contact him or her. As your body makes more red blood cells, you may need to take iron, folic acid, or vitamin B supplements. Ask your doctor or health care provider which products are right for you. If you have kidney disease continue dietary restrictions, even though this medication can make you feel better. Talk with your doctor or health care professional about the foods you eat and the vitamins that you take. What side effects may I notice from receiving this medicine? Side effects that you should report to your doctor or health care professional as soon as possible: -allergic reactions like skin rash, itching or hives, swelling of the face, lips, or tongue -breathing problems -changes in vision -chest pain -confusion, trouble speaking or understanding -feeling faint or lightheaded, falls -high blood pressure -muscle aches or pains -pain, swelling, warmth in the leg -rapid weight gain -severe headaches -sudden numbness or weakness of the face, arm or leg -trouble walking, dizziness, loss of balance or coordination -seizures (convulsions) -swelling of the ankles, feet, hands -unusually weak or tired Side effects that usually do not require medical attention (report to your doctor or health care professional if they continue or are bothersome): -diarrhea -fever, chills  (flu-like symptoms) -headaches -nausea, vomiting -redness, stinging, or swelling at site where injected This list may not describe all possible side effects. Call your doctor for medical advice about side effects. You may report side effects to FDA at 1-800-FDA-1088. Where should I keep my medicine? Keep out of the reach of children. Store in a refrigerator between 2 and 8 degrees C (36 and 46 degrees F). Do not freeze. Do not shake. Throw away any unused portion if using a single-dose vial. Throw away any unused medicine after the expiration date. NOTE: This sheet is a summary. It may not cover all possible information. If you have questions about this medicine, talk to your doctor, pharmacist, or health care provider.  2015, [employer] Standard. (2008-05-04 10:23:57)

## 2014-09-15 NOTE — Progress Notes (Signed)
Patient states she is still getting nose bleeds a couple of days a week and that 1 day per week she gets a nose bleed heavy enough to fill a kleenex.  No other bleeding noted.

## 2014-09-29 ENCOUNTER — Ambulatory Visit (HOSPITAL_BASED_OUTPATIENT_CLINIC_OR_DEPARTMENT_OTHER): Payer: Medicare Other

## 2014-09-29 ENCOUNTER — Other Ambulatory Visit (HOSPITAL_BASED_OUTPATIENT_CLINIC_OR_DEPARTMENT_OTHER): Payer: Medicare Other

## 2014-09-29 VITALS — BP 149/74 | HR 97 | Temp 98.3°F

## 2014-09-29 DIAGNOSIS — D631 Anemia in chronic kidney disease: Secondary | ICD-10-CM | POA: Diagnosis not present

## 2014-09-29 DIAGNOSIS — C7951 Secondary malignant neoplasm of bone: Secondary | ICD-10-CM

## 2014-09-29 DIAGNOSIS — N1330 Unspecified hydronephrosis: Secondary | ICD-10-CM

## 2014-09-29 DIAGNOSIS — N189 Chronic kidney disease, unspecified: Secondary | ICD-10-CM

## 2014-09-29 DIAGNOSIS — C50912 Malignant neoplasm of unspecified site of left female breast: Secondary | ICD-10-CM

## 2014-09-29 DIAGNOSIS — C50412 Malignant neoplasm of upper-outer quadrant of left female breast: Secondary | ICD-10-CM | POA: Diagnosis present

## 2014-09-29 DIAGNOSIS — C7931 Secondary malignant neoplasm of brain: Secondary | ICD-10-CM

## 2014-09-29 DIAGNOSIS — C50919 Malignant neoplasm of unspecified site of unspecified female breast: Secondary | ICD-10-CM

## 2014-09-29 LAB — COMPREHENSIVE METABOLIC PANEL (CC13)
ALBUMIN: 2.8 g/dL — AB (ref 3.5–5.0)
ALK PHOS: 286 U/L — AB (ref 40–150)
ALT: 41 U/L (ref 0–55)
ANION GAP: 14 meq/L — AB (ref 3–11)
AST: 98 U/L — AB (ref 5–34)
BILIRUBIN TOTAL: 0.56 mg/dL (ref 0.20–1.20)
BUN: 21.2 mg/dL (ref 7.0–26.0)
CALCIUM: 8.9 mg/dL (ref 8.4–10.4)
CO2: 19 mEq/L — ABNORMAL LOW (ref 22–29)
Chloride: 106 mEq/L (ref 98–109)
Creatinine: 1.2 mg/dL — ABNORMAL HIGH (ref 0.6–1.1)
EGFR: 45 mL/min/{1.73_m2} — ABNORMAL LOW (ref >90)
Glucose: 147 mg/dl — ABNORMAL HIGH (ref 70–140)
POTASSIUM: 4 meq/L (ref 3.5–5.1)
SODIUM: 139 meq/L (ref 136–145)
TOTAL PROTEIN: 6.6 g/dL (ref 6.4–8.3)

## 2014-09-29 LAB — CBC WITH DIFFERENTIAL/PLATELET
BASO%: 0.8 % (ref 0.0–2.0)
BASOS ABS: 0 10*3/uL (ref 0.0–0.1)
EOS%: 2.7 % (ref 0.0–7.0)
Eosinophils Absolute: 0.1 10*3/uL (ref 0.0–0.5)
HCT: 28.6 % — ABNORMAL LOW (ref 34.8–46.6)
HEMOGLOBIN: 9.2 g/dL — AB (ref 11.6–15.9)
LYMPH#: 0.7 10*3/uL — AB (ref 0.9–3.3)
LYMPH%: 27.4 % (ref 14.0–49.7)
MCH: 30.5 pg (ref 25.1–34.0)
MCHC: 32.2 g/dL (ref 31.5–36.0)
MCV: 94.7 fL (ref 79.5–101.0)
MONO#: 0.3 10*3/uL (ref 0.1–0.9)
MONO%: 12.2 % (ref 0.0–14.0)
NEUT#: 1.5 10*3/uL (ref 1.5–6.5)
NEUT%: 56.9 % (ref 38.4–76.8)
Platelets: 90 10*3/uL — ABNORMAL LOW (ref 145–400)
RBC: 3.02 10*6/uL — AB (ref 3.70–5.45)
RDW: 19.2 % — AB (ref 11.2–14.5)
WBC: 2.6 10*3/uL — AB (ref 3.9–10.3)

## 2014-09-29 MED ORDER — DARBEPOETIN ALFA 300 MCG/0.6ML IJ SOSY
300.0000 ug | PREFILLED_SYRINGE | Freq: Once | INTRAMUSCULAR | Status: AC
  Administered 2014-09-29: 300 ug via SUBCUTANEOUS
  Filled 2014-09-29: qty 0.6

## 2014-10-08 ENCOUNTER — Ambulatory Visit
Admission: RE | Admit: 2014-10-08 | Discharge: 2014-10-08 | Disposition: A | Discharge status: Home / Self Care | Payer: Medicare Other | Source: Ambulatory Visit | Attending: Radiation Oncology | Admitting: Radiation Oncology

## 2014-10-08 DIAGNOSIS — C7931 Secondary malignant neoplasm of brain: Secondary | ICD-10-CM

## 2014-10-08 MED ORDER — GADOBENATE DIMEGLUMINE 529 MG/ML IV SOLN: 10.0000 mL | Freq: Once | INTRAVENOUS | Status: DC | PRN

## 2014-10-08 MED ORDER — GADOBENATE DIMEGLUMINE 529 MG/ML IV SOLN: 9.0000 mL | Freq: Once | INTRAVENOUS | Status: AC | PRN

## 2014-10-10 ENCOUNTER — Encounter: Payer: Self-pay | Admitting: Radiation Therapy

## 2014-10-11 ENCOUNTER — Ambulatory Visit
Admission: RE | Admit: 2014-10-11 | Discharge: 2014-10-11 | Disposition: A | Discharge status: Home / Self Care | Payer: Medicare Other | Source: Ambulatory Visit | Attending: Radiation Oncology | Admitting: Radiation Oncology

## 2014-10-11 ENCOUNTER — Encounter: Payer: Self-pay | Admitting: Radiation Oncology

## 2014-10-11 VITALS — BP 121/74 | HR 97 | Resp 16 | Wt 105.8 lb

## 2014-10-11 DIAGNOSIS — C50412 Malignant neoplasm of upper-outer quadrant of left female breast: Secondary | ICD-10-CM

## 2014-10-11 NOTE — Progress Notes (Signed)
  Radiation Oncology         [phone]   Name: Vicki Marshall   Date: 10/11/2014   MRN: [id number]  DOB: 10-22-1945    [facility] Follow-Up Visit Note  CC: Lilian Coma, MD  Magrinat, Virgie Dad, MD    ICD-9-CM ICD-10-CM   1. 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   - Left medial cerebellar 3 mm to 20 Gy 07/05/2014   - Right parietal 3 mm to 20 Gy.    - Right parietal 2 mm to 20 Gy  Interval Since Last Radiation:  3 months  Narrative:  The patient returns today for routine follow-up. Vitals stable. Denies pain. 7 lb weight loss noted since 08/09/2014. Reports appetite is improving. Reports SOB has improved following blood transfusion on March 31st. Continues Aranesp every two weeks. Denies headache, dizziness, nausea, vomiting, diplopia or ringing in the ears.      ALLERGIES:  has No Known Allergies.  Meds: Current Outpatient Prescriptions  Medication Sig Dispense Refill  . calcium carbonate (TUMS - DOSED IN MG ELEMENTAL CALCIUM) 500 MG chewable tablet Chew 1 tablet by mouth daily.    . darbepoetin (ARANESP) 100 MCG/0.5ML SOLN injection Inject 300 mcg into the skin.     Marland Kitchen denosumab (XGEVA) 120 MG/1.7ML SOLN injection Inject 120 mg into the skin every 28 (twenty-eight) days.    Marland Kitchen everolimus (AFINITOR) 10 MG tablet Take 1 tablet (10 mg total) by mouth daily. 30 tablet 12  . exemestane (AROMASIN) 25 MG tablet Take 1 tablet (25 mg total) by mouth daily after breakfast. 30 tablet 12  . loratadine (CLARITIN) 10 MG tablet Take 1 tablet (10 mg total) by mouth daily. 100 tablet 4  . ALPRAZolam (XANAX) 0.5 MG tablet Take 1 tablet (0.5 mg total) by mouth at bedtime as needed  for anxiety. (Patient not taking: Reported on 08/09/2014) 10 tablet 0   No current facility-administered medications for this encounter.    Physical Findings: The patient is in no acute distress. Patient is alert and oriented.  weight is 105 lb 12.8 oz (47.991 kg). Her blood pressure is 121/74 and her pulse is 97. Her respiration is 16 and oxygen saturation is 100%. .  No significant changes.   Lab Findings: Lab Results  Component Value Date   WBC 2.6* 09/29/2014   HGB 9.2* 09/29/2014   HCT 28.6* 09/29/2014   MCV 94.7 09/29/2014   PLT 90* 09/29/2014    Impression:  The patient is recovering from the effects of radiation.    Plan: MRI in 3 months and follow-up.  _____________________________________  Sheral Apley. Tammi Klippel, M.D.

## 2014-10-11 NOTE — Progress Notes (Signed)
Vitals stable. Denies pain. 7 lb weight loss noted since 08/09/2014. Reports appetite is improving. Reports SOB has improved following blood transfusion on March 31st. Continues Aranesp every two weeks. Denies headache, dizziness, nausea, vomiting, diplopia or ringing in the ears.

## 2014-10-13 ENCOUNTER — Other Ambulatory Visit (HOSPITAL_BASED_OUTPATIENT_CLINIC_OR_DEPARTMENT_OTHER): Payer: Medicare Other

## 2014-10-13 ENCOUNTER — Ambulatory Visit (HOSPITAL_BASED_OUTPATIENT_CLINIC_OR_DEPARTMENT_OTHER): Payer: Medicare Other

## 2014-10-13 VITALS — BP 150/80 | HR 97 | Temp 98.3°F

## 2014-10-13 DIAGNOSIS — N189 Chronic kidney disease, unspecified: Secondary | ICD-10-CM

## 2014-10-13 DIAGNOSIS — C7951 Secondary malignant neoplasm of bone: Secondary | ICD-10-CM | POA: Diagnosis present

## 2014-10-13 DIAGNOSIS — D631 Anemia in chronic kidney disease: Secondary | ICD-10-CM

## 2014-10-13 DIAGNOSIS — C50912 Malignant neoplasm of unspecified site of left female breast: Secondary | ICD-10-CM

## 2014-10-13 DIAGNOSIS — N1330 Unspecified hydronephrosis: Secondary | ICD-10-CM

## 2014-10-13 DIAGNOSIS — C50919 Malignant neoplasm of unspecified site of unspecified female breast: Secondary | ICD-10-CM

## 2014-10-13 DIAGNOSIS — C50412 Malignant neoplasm of upper-outer quadrant of left female breast: Secondary | ICD-10-CM | POA: Diagnosis present

## 2014-10-13 DIAGNOSIS — C7931 Secondary malignant neoplasm of brain: Secondary | ICD-10-CM

## 2014-10-13 LAB — COMPREHENSIVE METABOLIC PANEL (CC13)
ALBUMIN: 2.9 g/dL — AB (ref 3.5–5.0)
ALK PHOS: 343 U/L — AB (ref 40–150)
ALT: 46 U/L (ref 0–55)
AST: 119 U/L — AB (ref 5–34)
Anion Gap: 10 mEq/L (ref 3–11)
BILIRUBIN TOTAL: 0.73 mg/dL (ref 0.20–1.20)
BUN: 23.6 mg/dL (ref 7.0–26.0)
CO2: 24 mEq/L (ref 22–29)
Calcium: 8.8 mg/dL (ref 8.4–10.4)
Chloride: 105 mEq/L (ref 98–109)
Creatinine: 1.2 mg/dL — ABNORMAL HIGH (ref 0.6–1.1)
EGFR: 47 mL/min/{1.73_m2} — AB (ref >90)
Glucose: 125 mg/dl (ref 70–140)
POTASSIUM: 4 meq/L (ref 3.5–5.1)
Sodium: 139 mEq/L (ref 136–145)
Total Protein: 6.6 g/dL (ref 6.4–8.3)

## 2014-10-13 LAB — CBC WITH DIFFERENTIAL/PLATELET
BASO%: 1 % (ref 0.0–2.0)
Basophils Absolute: 0 10*3/uL (ref 0.0–0.1)
EOS%: 2.4 % (ref 0.0–7.0)
Eosinophils Absolute: 0.1 10*3/uL (ref 0.0–0.5)
HCT: 27.3 % — ABNORMAL LOW (ref 34.8–46.6)
HGB: 8.8 g/dL — ABNORMAL LOW (ref 11.6–15.9)
LYMPH%: 18 % (ref 14.0–49.7)
MCH: 29.6 pg (ref 25.1–34.0)
MCHC: 32.3 g/dL (ref 31.5–36.0)
MCV: 91.8 fL (ref 79.5–101.0)
MONO#: 0.5 10*3/uL (ref 0.1–0.9)
MONO%: 19.8 % — ABNORMAL HIGH (ref 0.0–14.0)
NEUT%: 58.8 % (ref 38.4–76.8)
NEUTROS ABS: 1.5 10*3/uL (ref 1.5–6.5)
PLATELETS: 100 10*3/uL — AB (ref 145–400)
RBC: 2.98 10*6/uL — AB (ref 3.70–5.45)
RDW: 19.4 % — ABNORMAL HIGH (ref 11.2–14.5)
WBC: 2.6 10*3/uL — AB (ref 3.9–10.3)
lymph#: 0.5 10*3/uL — ABNORMAL LOW (ref 0.9–3.3)

## 2014-10-13 MED ORDER — DENOSUMAB 120 MG/1.7ML ~~LOC~~ SOLN
120.0000 mg | Freq: Once | SUBCUTANEOUS | Status: AC
  Administered 2014-10-13: 120 mg via SUBCUTANEOUS
  Filled 2014-10-13: qty 1.7

## 2014-10-13 MED ORDER — DARBEPOETIN ALFA 300 MCG/0.6ML IJ SOSY
300.0000 ug | PREFILLED_SYRINGE | Freq: Once | INTRAMUSCULAR | Status: AC
  Administered 2014-10-13: 300 ug via SUBCUTANEOUS
  Filled 2014-10-13: qty 0.6

## 2014-10-27 ENCOUNTER — Other Ambulatory Visit: Payer: Self-pay | Admitting: *Deleted

## 2014-10-27 ENCOUNTER — Ambulatory Visit (HOSPITAL_BASED_OUTPATIENT_CLINIC_OR_DEPARTMENT_OTHER): Payer: Medicare Other

## 2014-10-27 ENCOUNTER — Other Ambulatory Visit: Payer: Self-pay | Admitting: Oncology

## 2014-10-27 ENCOUNTER — Ambulatory Visit: Payer: Medicare Other

## 2014-10-27 ENCOUNTER — Other Ambulatory Visit (HOSPITAL_BASED_OUTPATIENT_CLINIC_OR_DEPARTMENT_OTHER): Payer: Medicare Other

## 2014-10-27 ENCOUNTER — Ambulatory Visit (HOSPITAL_COMMUNITY)
Admission: RE | Admit: 2014-10-27 | Discharge: 2014-10-27 | Disposition: A | Discharge status: Home / Self Care | Payer: Medicare Other | Source: Ambulatory Visit | Attending: Oncology | Admitting: Oncology

## 2014-10-27 VITALS — BP 135/77 | HR 93 | Temp 98.1°F

## 2014-10-27 DIAGNOSIS — N189 Chronic kidney disease, unspecified: Secondary | ICD-10-CM

## 2014-10-27 DIAGNOSIS — C7951 Secondary malignant neoplasm of bone: Secondary | ICD-10-CM

## 2014-10-27 DIAGNOSIS — D6489 Other specified anemias: Secondary | ICD-10-CM

## 2014-10-27 DIAGNOSIS — D631 Anemia in chronic kidney disease: Secondary | ICD-10-CM

## 2014-10-27 DIAGNOSIS — C50412 Malignant neoplasm of upper-outer quadrant of left female breast: Secondary | ICD-10-CM | POA: Diagnosis present

## 2014-10-27 DIAGNOSIS — C787 Secondary malignant neoplasm of liver and intrahepatic bile duct: Secondary | ICD-10-CM | POA: Diagnosis not present

## 2014-10-27 DIAGNOSIS — D630 Anemia in neoplastic disease: Secondary | ICD-10-CM

## 2014-10-27 DIAGNOSIS — C50919 Malignant neoplasm of unspecified site of unspecified female breast: Secondary | ICD-10-CM

## 2014-10-27 DIAGNOSIS — C7931 Secondary malignant neoplasm of brain: Secondary | ICD-10-CM

## 2014-10-27 DIAGNOSIS — D500 Iron deficiency anemia secondary to blood loss (chronic): Secondary | ICD-10-CM

## 2014-10-27 DIAGNOSIS — N1330 Unspecified hydronephrosis: Secondary | ICD-10-CM

## 2014-10-27 DIAGNOSIS — C50912 Malignant neoplasm of unspecified site of left female breast: Secondary | ICD-10-CM

## 2014-10-27 LAB — CBC & DIFF AND RETIC
BASO%: 1 % (ref 0.0–2.0)
BASOS ABS: 0 10*3/uL (ref 0.0–0.1)
EOS%: 2.2 % (ref 0.0–7.0)
Eosinophils Absolute: 0.1 10*3/uL (ref 0.0–0.5)
HEMATOCRIT: 27.3 % — AB (ref 34.8–46.6)
HGB: 8.4 g/dL — ABNORMAL LOW (ref 11.6–15.9)
Immature Retic Fract: 24.1 % — ABNORMAL HIGH (ref 1.60–10.00)
LYMPH%: 26.4 % (ref 14.0–49.7)
MCH: 29.4 pg (ref 25.1–34.0)
MCHC: 30.8 g/dL — AB (ref 31.5–36.0)
MCV: 95.5 fL (ref 79.5–101.0)
MONO#: 0.5 10*3/uL (ref 0.1–0.9)
MONO%: 15.3 % — AB (ref 0.0–14.0)
NEUT#: 1.7 10*3/uL (ref 1.5–6.5)
NEUT%: 55.1 % (ref 38.4–76.8)
NRBC: 0 % (ref 0–0)
PLATELETS: 88 10*3/uL — AB (ref 145–400)
RBC: 2.86 10*6/uL — AB (ref 3.70–5.45)
RDW: 19.6 % — AB (ref 11.2–14.5)
RETIC %: 3.13 % — AB (ref 0.70–2.10)
Retic Ct Abs: 89.52 10*3/uL (ref 33.70–90.70)
WBC: 3.1 10*3/uL — AB (ref 3.9–10.3)
lymph#: 0.8 10*3/uL — ABNORMAL LOW (ref 0.9–3.3)

## 2014-10-27 LAB — COMPREHENSIVE METABOLIC PANEL (CC13)
ALK PHOS: 356 U/L — AB (ref 40–150)
ALT: 49 U/L (ref 0–55)
AST: 124 U/L — AB (ref 5–34)
Albumin: 2.8 g/dL — ABNORMAL LOW (ref 3.5–5.0)
Anion Gap: 13 mEq/L — ABNORMAL HIGH (ref 3–11)
BILIRUBIN TOTAL: 0.68 mg/dL (ref 0.20–1.20)
BUN: 21 mg/dL (ref 7.0–26.0)
CHLORIDE: 107 meq/L (ref 98–109)
CO2: 19 meq/L — AB (ref 22–29)
Calcium: 8.2 mg/dL — ABNORMAL LOW (ref 8.4–10.4)
Creatinine: 1 mg/dL (ref 0.6–1.1)
EGFR: 57 mL/min/{1.73_m2} — AB (ref >90)
Glucose: 127 mg/dl (ref 70–140)
Potassium: 4.4 mEq/L (ref 3.5–5.1)
SODIUM: 139 meq/L (ref 136–145)
TOTAL PROTEIN: 6.5 g/dL (ref 6.4–8.3)

## 2014-10-27 MED ORDER — DARBEPOETIN ALFA 300 MCG/0.6ML IJ SOSY
300.0000 ug | PREFILLED_SYRINGE | Freq: Once | INTRAMUSCULAR | Status: AC
  Administered 2014-10-27: 300 ug via SUBCUTANEOUS
  Filled 2014-10-27: qty 0.6

## 2014-10-27 NOTE — Progress Notes (Signed)
Having some SOB, not extreme.  Is not worse than she was 2 weeks ago.  Just concerned that hemoglobin is continuing to down each time.  Discussed with Dr Jana Hakim   Will given blood tomorrow in the Sickle Cell Unit.

## 2014-10-28 ENCOUNTER — Other Ambulatory Visit: Payer: Self-pay | Admitting: Medical Oncology

## 2014-10-28 ENCOUNTER — Ambulatory Visit (HOSPITAL_COMMUNITY)
Admission: RE | Admit: 2014-10-28 | Discharge: 2014-10-28 | Disposition: A | Discharge status: Home / Self Care | Payer: Medicare Other | Source: Ambulatory Visit | Attending: Oncology | Admitting: Oncology

## 2014-10-28 VITALS — BP 141/70 | HR 86 | Temp 98.2°F | Resp 18

## 2014-10-28 DIAGNOSIS — C50412 Malignant neoplasm of upper-outer quadrant of left female breast: Secondary | ICD-10-CM | POA: Diagnosis not present

## 2014-10-28 DIAGNOSIS — D6489 Other specified anemias: Secondary | ICD-10-CM

## 2014-10-28 LAB — PREPARE RBC (CROSSMATCH)

## 2014-10-28 MED ORDER — HEPARIN SOD (PORK) LOCK FLUSH 100 UNIT/ML IV SOLN: 500.0000 [IU] | Freq: Every day | INTRAVENOUS | Status: DC | PRN

## 2014-10-28 MED ORDER — SODIUM CHLORIDE 0.9 % IV SOLN: 250.0000 mL | Freq: Once | INTRAVENOUS | Status: DC

## 2014-10-28 MED ORDER — SODIUM CHLORIDE 0.9 % IJ SOLN: 3.0000 mL | INTRAMUSCULAR | Status: DC | PRN

## 2014-10-28 MED ORDER — SODIUM CHLORIDE 0.9 % IJ SOLN: 10.0000 mL | INTRAMUSCULAR | Status: DC | PRN

## 2014-10-28 MED ORDER — HEPARIN SOD (PORK) LOCK FLUSH 100 UNIT/ML IV SOLN: 250.0000 [IU] | INTRAVENOUS | Status: DC | PRN

## 2014-10-28 NOTE — Procedures (Signed)
Associated diagnosis: Anemia due to other cause 285.8 MD: Magrinat Procedure note: IV started, 2 units transfused, IV removed Condition during procedure: Tolerated well Condition after procedure: Alert, oriented and ambulatory. No complaints.

## 2014-10-29 LAB — TYPE AND SCREEN
ABO/RH(D): A POS
Antibody Screen: NEGATIVE
UNIT DIVISION: 0
Unit division: 0

## 2014-11-04 ENCOUNTER — Other Ambulatory Visit: Payer: Self-pay | Admitting: Oncology

## 2014-11-04 ENCOUNTER — Ambulatory Visit (HOSPITAL_COMMUNITY)
Admission: RE | Admit: 2014-11-04 | Discharge: 2014-11-04 | Disposition: A | Discharge status: Home / Self Care | Payer: Medicare Other | Source: Ambulatory Visit | Attending: Oncology | Admitting: Oncology

## 2014-11-04 DIAGNOSIS — C787 Secondary malignant neoplasm of liver and intrahepatic bile duct: Secondary | ICD-10-CM | POA: Insufficient documentation

## 2014-11-04 DIAGNOSIS — C7931 Secondary malignant neoplasm of brain: Secondary | ICD-10-CM | POA: Insufficient documentation

## 2014-11-04 DIAGNOSIS — J90 Pleural effusion, not elsewhere classified: Secondary | ICD-10-CM | POA: Diagnosis not present

## 2014-11-04 DIAGNOSIS — C50919 Malignant neoplasm of unspecified site of unspecified female breast: Secondary | ICD-10-CM | POA: Diagnosis not present

## 2014-11-04 DIAGNOSIS — D630 Anemia in neoplastic disease: Secondary | ICD-10-CM

## 2014-11-04 DIAGNOSIS — R911 Solitary pulmonary nodule: Secondary | ICD-10-CM | POA: Insufficient documentation

## 2014-11-04 DIAGNOSIS — C7951 Secondary malignant neoplasm of bone: Secondary | ICD-10-CM | POA: Insufficient documentation

## 2014-11-04 MED ORDER — GADOBENATE DIMEGLUMINE 529 MG/ML IV SOLN
10.0000 mL | Freq: Once | INTRAVENOUS | Status: AC | PRN
  Administered 2014-11-04: 10 mL via INTRAVENOUS

## 2014-11-04 NOTE — Progress Notes (Unsigned)
I was called by Dr. Leonia Reeves to report that Vicki Marshall is having possibly a consolidation in the right base. I checked with her and she is having no fever, shortness of breath, or pleurisy. She has a little bit of cough in the morning after waking up but it is dry. She actually feels better since she just had a transfusion. She will call me if she develops any symptoms such as those have just listed, but at this point I am not starting her on antibiotics.

## 2014-11-05 ENCOUNTER — Other Ambulatory Visit: Payer: Self-pay | Admitting: *Deleted

## 2014-11-05 DIAGNOSIS — C7931 Secondary malignant neoplasm of brain: Secondary | ICD-10-CM

## 2014-11-05 DIAGNOSIS — C7949 Secondary malignant neoplasm of other parts of nervous system: Principal | ICD-10-CM

## 2014-11-10 ENCOUNTER — Ambulatory Visit: Payer: Medicare Other

## 2014-11-10 ENCOUNTER — Ambulatory Visit (HOSPITAL_BASED_OUTPATIENT_CLINIC_OR_DEPARTMENT_OTHER): Payer: Medicare Other | Admitting: Oncology

## 2014-11-10 ENCOUNTER — Other Ambulatory Visit: Payer: Medicare Other

## 2014-11-10 ENCOUNTER — Ambulatory Visit (HOSPITAL_BASED_OUTPATIENT_CLINIC_OR_DEPARTMENT_OTHER): Payer: Medicare Other

## 2014-11-10 ENCOUNTER — Other Ambulatory Visit (HOSPITAL_BASED_OUTPATIENT_CLINIC_OR_DEPARTMENT_OTHER): Payer: Medicare Other

## 2014-11-10 ENCOUNTER — Telehealth: Payer: Self-pay | Admitting: Oncology

## 2014-11-10 ENCOUNTER — Other Ambulatory Visit: Payer: Self-pay | Admitting: *Deleted

## 2014-11-10 VITALS — BP 149/86 | HR 99 | Temp 97.9°F | Resp 18 | Ht 65.0 in | Wt 105.8 lb

## 2014-11-10 DIAGNOSIS — C50919 Malignant neoplasm of unspecified site of unspecified female breast: Secondary | ICD-10-CM

## 2014-11-10 DIAGNOSIS — C7951 Secondary malignant neoplasm of bone: Secondary | ICD-10-CM

## 2014-11-10 DIAGNOSIS — D631 Anemia in chronic kidney disease: Secondary | ICD-10-CM | POA: Diagnosis not present

## 2014-11-10 DIAGNOSIS — C787 Secondary malignant neoplasm of liver and intrahepatic bile duct: Secondary | ICD-10-CM

## 2014-11-10 DIAGNOSIS — Z170 Estrogen receptor positive status [ER+]: Secondary | ICD-10-CM | POA: Diagnosis not present

## 2014-11-10 DIAGNOSIS — N189 Chronic kidney disease, unspecified: Secondary | ICD-10-CM

## 2014-11-10 DIAGNOSIS — C7800 Secondary malignant neoplasm of unspecified lung: Secondary | ICD-10-CM

## 2014-11-10 DIAGNOSIS — C50412 Malignant neoplasm of upper-outer quadrant of left female breast: Secondary | ICD-10-CM | POA: Diagnosis present

## 2014-11-10 DIAGNOSIS — C50912 Malignant neoplasm of unspecified site of left female breast: Secondary | ICD-10-CM

## 2014-11-10 DIAGNOSIS — C773 Secondary and unspecified malignant neoplasm of axilla and upper limb lymph nodes: Secondary | ICD-10-CM | POA: Diagnosis not present

## 2014-11-10 DIAGNOSIS — N1330 Unspecified hydronephrosis: Secondary | ICD-10-CM

## 2014-11-10 DIAGNOSIS — C7931 Secondary malignant neoplasm of brain: Secondary | ICD-10-CM

## 2014-11-10 LAB — CBC WITH DIFFERENTIAL/PLATELET
BASO%: 0.7 % (ref 0.0–2.0)
BASOS ABS: 0 10*3/uL (ref 0.0–0.1)
EOS ABS: 0 10*3/uL (ref 0.0–0.5)
EOS%: 1.3 % (ref 0.0–7.0)
HCT: 36.7 % (ref 34.8–46.6)
HGB: 11.7 g/dL (ref 11.6–15.9)
LYMPH#: 0.7 10*3/uL — AB (ref 0.9–3.3)
LYMPH%: 23.9 % (ref 14.0–49.7)
MCH: 29.4 pg (ref 25.1–34.0)
MCHC: 31.9 g/dL (ref 31.5–36.0)
MCV: 92.2 fL (ref 79.5–101.0)
MONO#: 0.6 10*3/uL (ref 0.1–0.9)
MONO%: 18.5 % — ABNORMAL HIGH (ref 0.0–14.0)
NEUT%: 55.6 % (ref 38.4–76.8)
NEUTROS ABS: 1.7 10*3/uL (ref 1.5–6.5)
Platelets: 78 10*3/uL — ABNORMAL LOW (ref 145–400)
RBC: 3.98 10*6/uL (ref 3.70–5.45)
RDW: 18.9 % — AB (ref 11.2–14.5)
WBC: 3 10*3/uL — AB (ref 3.9–10.3)

## 2014-11-10 LAB — COMPREHENSIVE METABOLIC PANEL (CC13)
ALT: 57 U/L — ABNORMAL HIGH (ref 0–55)
ANION GAP: 10 meq/L (ref 3–11)
AST: 161 U/L — ABNORMAL HIGH (ref 5–34)
Albumin: 2.8 g/dL — ABNORMAL LOW (ref 3.5–5.0)
Alkaline Phosphatase: 507 U/L — ABNORMAL HIGH (ref 40–150)
BUN: 16.4 mg/dL (ref 7.0–26.0)
CALCIUM: 9.3 mg/dL (ref 8.4–10.4)
CO2: 23 meq/L (ref 22–29)
Chloride: 107 mEq/L (ref 98–109)
Creatinine: 1 mg/dL (ref 0.6–1.1)
EGFR: 58 mL/min/{1.73_m2} — AB (ref >90)
GLUCOSE: 86 mg/dL (ref 70–140)
Potassium: 4 mEq/L (ref 3.5–5.1)
Sodium: 139 mEq/L (ref 136–145)
TOTAL PROTEIN: 6.9 g/dL (ref 6.4–8.3)
Total Bilirubin: 1.21 mg/dL — ABNORMAL HIGH (ref 0.20–1.20)

## 2014-11-10 LAB — HOLD TUBE, BLOOD BANK

## 2014-11-10 MED ORDER — DARBEPOETIN ALFA 300 MCG/0.6ML IJ SOSY: 300.0000 ug | PREFILLED_SYRINGE | Freq: Once | INTRAMUSCULAR | Status: DC

## 2014-11-10 MED ORDER — DENOSUMAB 120 MG/1.7ML ~~LOC~~ SOLN
120.0000 mg | Freq: Once | SUBCUTANEOUS | Status: DC
  Administered 2014-11-10: 120 mg via SUBCUTANEOUS
  Filled 2014-11-10: qty 1.7

## 2014-11-10 MED ORDER — CAPECITABINE 500 MG PO TABS: 1000.0000 mg/m2 | ORAL_TABLET | Freq: Two times a day (BID) | ORAL | Status: AC

## 2014-11-10 NOTE — Telephone Encounter (Signed)
Appointments made and avs printed for patient °

## 2014-11-10 NOTE — Progress Notes (Signed)
[place]  [phone]) [phone] [phone]) [phone]     ID: [name] OB: 04/09/1946  MR#: [id number]  PFX#:[id number]  PCP: Lilian Coma, MD GYN:   SU: Excell Seltzer OTHER MD: Arloa Koh, Altamese Cabal, Scott Minor DDS, Tyler Pita, Franchot Gallo, Consuella Lose  CHIEF COMPLAINT: stage IV breast cancer, estrogen receptor positive  TREATMENT: exemestane, everolimus and denosumab  BREAST CANCER HISTORY: From the original intake note 11/04/2013:  Vicki Marshall noted a change in her left breast approximately mid 2014. This was a flattening of the lateral aspect of the left breast. Eventually she noted an indentation and eventually a "sore" in that area. She had not shared this information with her husband, and she did not have a primary care physician. More recently, as she started to have symptoms related to this, she mentioned it to her husband [name] and she establish yourself with Dr. Stephanie Acre, who set her up for bilateral diagnostic mammography and ultrasonography at the [facility] 10/23/2013.. This showed a breast density to be category C. The right breast was negative.  The left breast contained an ulcerated mass laterally measuring at least 4 cm mammography. On physical exam there was an ulcerated mass in the 2:00 location of the left breast, associated with erythematous skin nodules in the upper inner quadrant. The left breast was smaller than the right, and firm. There were palpable left axillary lymph nodes. In addition, on the right upper chest wall, infraclavicular like, there was a firm nodule separate from the bony structures. There was also a right preauricular node and a firm left submandibular node.  Ultrasound showed multiple nodules throughout the lower portion of the left breast. There was a large mass in the upper outer quadrant of the left breast contiguous with the skin ulceration, measuring greater than 5 cm. The left axilla  showed at least 3 suspicious masses, the largest measuring 1.9 cm. Ultrasound of the palpable abnormality of the right chest wall showed a superficial irregular mass, subdermal, measuring 0.8 cm.  Biopsies of the left breast mass, a left axillary lymph node, and the right infra-clavicular mass on 10/23/2013, all showed ([id number]) and invasive ductal carcinoma, grade 1 or 2, estrogen receptor 100% positive, progesterone receptor 23% positive, both with strong staining intensity, with an MIB-1 of 61%, and no HER-2 amplification, the signals ratio being 0.90, and the number per cell 1.80.  On 11/02/2013 the patient underwent bilateral breast MRI and this showed, in the left breast, a 5.8 cm mass eroding through the overlying skin, and also invading the underlying pectoralis muscle and chest wall. There were multiple enhancing nodules throughout the left breast and an adjacent 4.8 cm irregular enhancing mass inferiorly. There are multiple large irregular left axillary lymph nodes, the largest measuring 2.4 cm. There was a 0.9 cm left subpectoral lymph node as well as prevascular and precarinal lymph nodes.  In the right breast far upper inner quadrant there was a 0.8 cm irregular enhancing mass abutting the overlying skin. This is the mass that was biopsied and shown to be positive. In addition there was patchy heterogeneous enhancement of the sternum.  The patient's subsequent history is as detailed below  INTERVAL HISTORY: Edelyn returns today for follow-up of her metastatic breast cancer accompanied by her husband, [name].  She continues on exemestane and everolimus, which she is generally tolerating well. She has also been very anemic. She did receive 2 units of blood and she immediately felt considerably better. This was lucky she  says because "everything at home is breaking down", meaning that many of the appliances need to be replaced and there is some work to be done and she is trying to help [name]  gets through these minor emergencies.  REVIEW OF SYSTEMS: She continues to feel tired particular when her hemoglobin is low. She is short of breath with exertion. Her appetite remains poor. She has back and joint pain which is not more persistent or intense than before. There have been no unusual headaches, visual changes, nausea, vomiting dizziness, or gait imbalance. A detailed review of systems today was otherwise stable  PAST MEDICAL HISTORY: Past Medical History  Diagnosis Date  . Urinary tract infection   . Anemia   . Breast cancer     T3N2M1 stage IV invasive ductal carcinoma   . Brain metastases   . Hydronephrosis, left   . Lung metastases   . Bone metastases     PAST SURGICAL HISTORY: Past Surgical History  Procedure Laterality Date  . Cataract extraction  2013  . Mouth surgery  1981    FAMILY HISTORY: The patient's father died at the age of 70, from pneumonia. The patient's mother lived to be 70. The patient had no brothers, one sister. There is no history of breast or ovarian cancer in the family  GYNECOLOGIC HISTORY:  Menarche age 8. The patient is GX P0. She stopped having periods approximately 2003. She did not take hormone replacement. She used birth control pills for approximately 8 years remotely, with no complications   SOCIAL HISTORY:  Berania retired about a year ago. 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 Her husband [name] is retired from working in Char[employer] fundraiser. They live alone, with no pets.    ADVANCED DIRECTIVES: Not in place   HEALTH MAINTENANCE: History  Substance Use Topics  . Smoking status: Never Smoker   . Smokeless tobacco: Never Used  . Alcohol Use: Yes     Comment: 10 12 glasses/week     Colonoscopy: Never  PAP: 1990  Bone density: Never  Lipid panel:  No Known Allergies  Current Outpatient Prescriptions  Medication Sig Dispense Refill  . ALPRAZolam (XANAX) 0.5 MG tablet Take 1 tablet (0.5 mg total)  by mouth at bedtime as needed for anxiety. (Patient not taking: Reported on 08/09/2014) 10 tablet 0  . calcium carbonate (TUMS - DOSED IN MG ELEMENTAL CALCIUM) 500 MG chewable tablet Chew 1 tablet by mouth daily.    . darbepoetin (ARANESP) 100 MCG/0.5ML SOLN injection Inject 300 mcg into the skin.     Marland Kitchen denosumab (XGEVA) 120 MG/1.7ML SOLN injection Inject 120 mg into the skin every 28 (twenty-eight) days.    Marland Kitchen everolimus (AFINITOR) 10 MG tablet Take 1 tablet (10 mg total) by mouth daily. 30 tablet 12  . exemestane (AROMASIN) 25 MG tablet Take 1 tablet (25 mg total) by mouth daily after breakfast. 30 tablet 12  . loratadine (CLARITIN) 10 MG tablet Take 1 tablet (10 mg total) by mouth daily. 100 tablet 4   No current facility-administered medications for this visit.    OBJECTIVE: Middle-aged white woman in no acute distress Filed Vitals:   11/10/14 1115  BP: 149/86  Pulse: 99  Temp: 97.9 F (36.6 C)  Resp: 18     Body mass index is 17.61 kg/(m^2).    ECOG FS:1 - Symptomatic but completely ambulatory  Sclerae unicteric, EOMs intact Oropharynx clear, dentition in good repair No cervical or supraclavicular adenopathy Lungs  no rales or rhonchi Heart regular rate and rhythm Abd soft, nontender, positive bowel sounds MSK no focal spinal tenderness, no upper extremity lymphedema Neuro: nonfocal, well oriented, appropriate affect Breasts: Deferred   LAB RESULTS:  CMP     Component Value Date/Time   NA 139 10/27/2014 1454   NA 134* 09/15/2014 1449   K 4.4 10/27/2014 1454   K 4.0 09/15/2014 1449   CL 101 09/15/2014 1449   CO2 19* 10/27/2014 1454   CO2 22 09/15/2014 1449   GLUCOSE 127 10/27/2014 1454   GLUCOSE 130* 09/15/2014 1449   BUN 21.0 10/27/2014 1454   BUN 17 09/15/2014 1449   CREATININE 1.0 10/27/2014 1454   CREATININE 0.93 09/15/2014 1449   CALCIUM 8.2* 10/27/2014 1454   CALCIUM 8.9 09/15/2014 1449   PROT 6.5 10/27/2014 1454   PROT 6.6 09/15/2014 1449   ALBUMIN 2.8*  10/27/2014 1454   ALBUMIN 3.4* 09/15/2014 1449   AST 124* 10/27/2014 1454   AST 83* 09/15/2014 1449   ALT 49 10/27/2014 1454   ALT 41* 09/15/2014 1449   ALKPHOS 356* 10/27/2014 1454   ALKPHOS 238* 09/15/2014 1449   BILITOT 0.68 10/27/2014 1454   BILITOT 0.6 09/15/2014 1449    I No results found for: SPEP  Lab Results  Component Value Date   WBC 3.0* 11/10/2014   NEUTROABS 1.7 11/10/2014   HGB 11.7 11/10/2014   HCT 36.7 11/10/2014   MCV 92.2 11/10/2014   PLT 78* 11/10/2014      Chemistry      Component Value Date/Time   NA 139 10/27/2014 1454   NA 134* 09/15/2014 1449   K 4.4 10/27/2014 1454   K 4.0 09/15/2014 1449   CL 101 09/15/2014 1449   CO2 19* 10/27/2014 1454   CO2 22 09/15/2014 1449   BUN 21.0 10/27/2014 1454   BUN 17 09/15/2014 1449   CREATININE 1.0 10/27/2014 1454   CREATININE 0.93 09/15/2014 1449      Component Value Date/Time   CALCIUM 8.2* 10/27/2014 1454   CALCIUM 8.9 09/15/2014 1449   ALKPHOS 356* 10/27/2014 1454   ALKPHOS 238* 09/15/2014 1449   AST 124* 10/27/2014 1454   AST 83* 09/15/2014 1449   ALT 49 10/27/2014 1454   ALT 41* 09/15/2014 1449   BILITOT 0.68 10/27/2014 1454   BILITOT 0.6 09/15/2014 1449       No results found for: LABCA2  No components found for: ESPQZ300  No results for input(s): INR in the last 168 hours.  Urinalysis No results found for: COLORURINE  STUDIES: Mr Liver W Wo Contrast  11-09-2014   CLINICAL DATA:  Breast cancer. Subsequent treatment evaluation. Hepatic metastasis.  EXAM: MRI ABDOMEN WITHOUT AND WITH CONTRAST  TECHNIQUE: Multiplanar multisequence MR imaging of the abdomen was performed both before and after the administration of intravenous contrast.  CONTRAST:  27m MULTIHANCE GADOBENATE DIMEGLUMINE 529 MG/ML IV SOLN  COMPARISON:  MRI 09/06/2014  FINDINGS: Lower chest: Bilateral pleural effusions and peripheral consolidation at the right lung base (image 7, series 901). 6 mm pulmonary nodule at the left  lung base on image number 4, series 4 is not changed from 8 mm on comparison PET-CT scan  Hepatobiliary: Again demonstrated bilateral enhancing hepatic metastases. These lesions are well demarcated on the fat-suppressed T2 weighted series 4.  Example lesion in the central right hepatic lobe measures 37 x 27 mm compared to 28 x 22 mm for slight increase.  Inferior right hepatic lobe lesion measures 35 x 34  mm increased from 27 x 31 mm.  Posterior right lobe lesion measures 21 x 17 mm (image number 9 series 4) increased from 16 x 13 mm. Lesion in the left lateral hepatic lobe measures 24 mm increased from 19 mm. No new lesions are identified  The portal veins are patent. Gallbladder is normal. No biliary duct dilatation.  Pancreas: Normal pancreatic parenchymal intensity. No ductal dilatation or inflammation.  Spleen: Normal spleen.  Adrenals/urinary tract: Adrenal glands and kidneys are normal.  Stomach/Bowel: Stomach and limited of the small bowel is unremarkable  Vascular/Lymphatic: Abdominal aortic normal caliber. No retroperitoneal periportal lymphadenopathy.  Musculoskeletal: No aggressive osseous lesion  IMPRESSION: 1. Mild progression of multifocal hepatic metastasis. 2. New peripheral consolidation at the right lung base. Correlate clinically for pulmonary infection. 3. Stable sclerotic osseous metastasis.   Electronically Signed   By: Suzy Bouchard M.D.   On: 11/04/2014 11:05     ASSESSMENT: 69 y.o. [place] woman status post left breast and left axillary lymph node biopsy as well as right infraclavicular chest wall biopsy 10/23/2013 for a clinical T3, N2, M1, stage IV invasive ductal carcinoma, grade 2, estrogen receptor 100% positive, progesterone receptor 23% positive, with an MIB-1 of 61%, and no HER-2 amplification.  (1) staging studies 11/05/2013 showed metastatic involvement of bone, liver, lungs, and brain  (2) antiestrogen therapy with letrozole and fulvestrant as well as targeted  therapy with palbociclib started 11/11/2013, discontinued January 2015 with progression  (3) denosumab monthly started 11/11/2013  (4) s/p stereotactic radiosurgery to brain metastases 03/19/2014 - Left lateral cerebellar 4 mm to 20 Gy.   - Left medial cerebellar 3 mm to 20 Gy  (5) Right hydronephrosis w/o obstruction-- followed by urology ([facility])--resolved as of abd MRI 06/02/2014  (6) exemestane and everolimus started 06/07/2014, discontinued 11/10/2014 with rogression  (7) s/p further stereotactic radiosurgery 07/05/2014  R parietal 3 mm target: 20 Gy  R parietal 2 mm target: 20 Gy  (8) anemia of CKD;  started darbepoietin 08/04/2014; dose increased 09/15/2014 and again 11/10/2014; ferritin checked regularly  (9) capecitabine 1.5 g BID 7 days on/ 7days off started  PLAN: Marisha's liver lesions have grown despite the exemestane and everolimus. She understands we need to move to different agents. As usual at this point we reviewed the fact that stage IV breast cancer is not curable with our current knowledge base. The goal of treatment is control. The strategy of treatment is to do only the minimum necessary to control the growth of the tumor so that the patient can have as normal a life as possible. There is no survival advantage in treating aggressively if treating less aggressively results in tumor control. With this strategy stage IV breast cancer in many cases can function as a "chronic illness": something that cannot be quite gotten rid of but can be controlled for an indefinite period of time  There are always 3 questions to go over and so we reviewed those today. The first question is do we treat or not. In some cases the patient's overall situation is so discouraging that palliative/comfort care alone is appropriate. If the decision is made to treat, then the next question is whether anti-estrogens or chemotherapy is more appropriate. Once that decision is made than the  third question is: Which agent or combination of agents in particular should be used?  Murial is very clear that she would like treatment, even aggressive treatment if necessary. As far as the second question is concerned, I am "out"  of anti-estrogens. Certainly we could try anastrozole or tamoxifen, but that would just duplicate some of the treatments that proved ineffective before  Accordingly we need to move to chemotherapy. We have many options there. 2 options we discussed today were capecitabine versus CMF. We discussed the possible toxicities, side effects and complications of these agents. We also discussed the possibility of her participating in a trial such as might be available at [facility] or [place].  After much discussion she is attracted to capecitabine because of the fact that she would not need a port. She has a good understanding of concerns regarding diarrhea, mucositis, and palmar plantar erythrodysesthesia in particular. I have gone ahead and entered the order and as soon as that medication is available she will start it. I have tentatively made her an appointment here in about 10 days just to make sure returning is in place. We would then check her counts on day 1 of each treatment week, and plan to restage her as far as the peripheral disease is concerned after 3 months. Of course we will be following her counts as well during this time. She is already set up for a repeat brain MRI in August  Today I also increased her Aranesp dose to 500. The goal is to keep her hemoglobin at least over 9, preferably over 10. We are also going to be following her ferritin on a regular basis.  Amiyrah has a good understanding of the overall plan. She agrees with it. She knows the goal of treatment in her case is control. She will call with any problems that may develop before her next visit here.  Chauncey Cruel, MD

## 2014-11-24 ENCOUNTER — Other Ambulatory Visit (HOSPITAL_BASED_OUTPATIENT_CLINIC_OR_DEPARTMENT_OTHER): Payer: Medicare Other

## 2014-11-24 ENCOUNTER — Encounter: Payer: Self-pay | Admitting: Nurse Practitioner

## 2014-11-24 ENCOUNTER — Telehealth: Payer: Self-pay | Admitting: Oncology

## 2014-11-24 ENCOUNTER — Ambulatory Visit: Payer: Medicare Other

## 2014-11-24 ENCOUNTER — Other Ambulatory Visit: Payer: Medicare Other

## 2014-11-24 ENCOUNTER — Ambulatory Visit (HOSPITAL_BASED_OUTPATIENT_CLINIC_OR_DEPARTMENT_OTHER): Payer: Medicare Other | Admitting: Nurse Practitioner

## 2014-11-24 VITALS — BP 142/74 | HR 101 | Temp 98.2°F | Resp 18 | Wt 113.6 lb

## 2014-11-24 DIAGNOSIS — C7800 Secondary malignant neoplasm of unspecified lung: Secondary | ICD-10-CM

## 2014-11-24 DIAGNOSIS — D631 Anemia in chronic kidney disease: Secondary | ICD-10-CM

## 2014-11-24 DIAGNOSIS — C50412 Malignant neoplasm of upper-outer quadrant of left female breast: Secondary | ICD-10-CM

## 2014-11-24 DIAGNOSIS — C773 Secondary and unspecified malignant neoplasm of axilla and upper limb lymph nodes: Secondary | ICD-10-CM | POA: Diagnosis not present

## 2014-11-24 DIAGNOSIS — C787 Secondary malignant neoplasm of liver and intrahepatic bile duct: Secondary | ICD-10-CM

## 2014-11-24 DIAGNOSIS — C50919 Malignant neoplasm of unspecified site of unspecified female breast: Secondary | ICD-10-CM

## 2014-11-24 DIAGNOSIS — N1330 Unspecified hydronephrosis: Secondary | ICD-10-CM

## 2014-11-24 DIAGNOSIS — D630 Anemia in neoplastic disease: Secondary | ICD-10-CM

## 2014-11-24 DIAGNOSIS — N189 Chronic kidney disease, unspecified: Secondary | ICD-10-CM | POA: Diagnosis not present

## 2014-11-24 DIAGNOSIS — C7951 Secondary malignant neoplasm of bone: Secondary | ICD-10-CM

## 2014-11-24 DIAGNOSIS — C7931 Secondary malignant neoplasm of brain: Secondary | ICD-10-CM

## 2014-11-24 DIAGNOSIS — Z170 Estrogen receptor positive status [ER+]: Secondary | ICD-10-CM | POA: Diagnosis not present

## 2014-11-24 DIAGNOSIS — C50912 Malignant neoplasm of unspecified site of left female breast: Secondary | ICD-10-CM

## 2014-11-24 LAB — COMPREHENSIVE METABOLIC PANEL (CC13)
ALBUMIN: 2.6 g/dL — AB (ref 3.5–5.0)
ALT: 56 U/L — ABNORMAL HIGH (ref 0–55)
AST: 231 U/L — AB (ref 5–34)
Alkaline Phosphatase: 562 U/L — ABNORMAL HIGH (ref 40–150)
Anion Gap: 8 mEq/L (ref 3–11)
BILIRUBIN TOTAL: 1.14 mg/dL (ref 0.20–1.20)
BUN: 20.5 mg/dL (ref 7.0–26.0)
CO2: 24 mEq/L (ref 22–29)
Calcium: 8.7 mg/dL (ref 8.4–10.4)
Chloride: 105 mEq/L (ref 98–109)
Creatinine: 0.9 mg/dL (ref 0.6–1.1)
EGFR: 67 mL/min/{1.73_m2} — ABNORMAL LOW (ref >90)
Glucose: 97 mg/dl (ref 70–140)
Potassium: 4.2 mEq/L (ref 3.5–5.1)
Sodium: 137 mEq/L (ref 136–145)
Total Protein: 6.1 g/dL — ABNORMAL LOW (ref 6.4–8.3)

## 2014-11-24 LAB — CBC & DIFF AND RETIC
BASO%: 0.4 % (ref 0.0–2.0)
BASOS ABS: 0 10*3/uL (ref 0.0–0.1)
EOS%: 0.8 % (ref 0.0–7.0)
Eosinophils Absolute: 0 10*3/uL (ref 0.0–0.5)
HEMATOCRIT: 32.8 % — AB (ref 34.8–46.6)
HGB: 10.5 g/dL — ABNORMAL LOW (ref 11.6–15.9)
IMMATURE RETIC FRACT: 23.6 % — AB (ref 1.60–10.00)
LYMPH%: 23.5 % (ref 14.0–49.7)
MCH: 29.5 pg (ref 25.1–34.0)
MCHC: 32 g/dL (ref 31.5–36.0)
MCV: 92.1 fL (ref 79.5–101.0)
MONO#: 1 10*3/uL — ABNORMAL HIGH (ref 0.1–0.9)
MONO%: 19.8 % — AB (ref 0.0–14.0)
NEUT%: 55.5 % (ref 38.4–76.8)
NEUTROS ABS: 2.7 10*3/uL (ref 1.5–6.5)
PLATELETS: 165 10*3/uL (ref 145–400)
RBC: 3.56 10*6/uL — ABNORMAL LOW (ref 3.70–5.45)
RDW: 19.5 % — ABNORMAL HIGH (ref 11.2–14.5)
Retic %: 2.54 % — ABNORMAL HIGH (ref 0.70–2.10)
Retic Ct Abs: 90.42 10*3/uL (ref 33.70–90.70)
WBC: 4.8 10*3/uL (ref 3.9–10.3)
lymph#: 1.1 10*3/uL (ref 0.9–3.3)

## 2014-11-24 NOTE — Progress Notes (Signed)
[facility]  [phone] Fax:([phone]) [phone]     ID: [name] OB: [birth year]  MR#: [id number]  AJO#:[id number]  PCP: Lilian Coma, MD GYN:   SU: Excell Seltzer OTHER MD: Arloa Koh, Altamese Cabal, Scott Minor DDS, Tyler Pita, Ottis Stain  CHIEF COMPLAINT: stage IV breast cancer, estrogen receptor positive  TREATMENT: capecitabine, aranesp, and denosumab  BREAST CANCER HISTORY: From the original intake note 11/04/2013:  Timica noted a change in her left breast approximately mid 2014. This was a flattening of the lateral aspect of the left breast. Eventually she noted an indentation and eventually a "sore" in that area. She had not shared this information with her husband, and she did not have a primary care physician. More recently, as she started to have symptoms related to this, she mentioned it to her husband [name] and she establish yourself with Dr. Stephanie Acre, who set her up for bilateral diagnostic mammography and ultrasonography at the [facility] 10/23/2013.. This showed a breast density to be category C. The right breast was negative.  The left breast contained an ulcerated mass laterally measuring at least 4 cm mammography. On physical exam there was an ulcerated mass in the 2:00 location of the left breast, associated with erythematous skin nodules in the upper inner quadrant. The left breast was smaller than the right, and firm. There were palpable left axillary lymph nodes. In addition, on the right upper chest wall, infraclavicular like, there was a firm nodule separate from the bony structures. There was also a right preauricular node and a firm left submandibular node.  Ultrasound showed multiple nodules throughout the lower portion of the left breast. There was a large mass in the upper outer quadrant of the left breast contiguous with the skin ulceration, measuring greater than 5 cm. The left axilla  showed at least 3 suspicious masses, the largest measuring 1.9 cm. Ultrasound of the palpable abnormality of the right chest wall showed a superficial irregular mass, subdermal, measuring 0.8 cm.  Biopsies of the left breast mass, a left axillary lymph node, and the right infra-clavicular mass on 10/23/2013, all showed ([id number]) and invasive ductal carcinoma, grade 1 or 2, estrogen receptor 100% positive, progesterone receptor 23% positive, both with strong staining intensity, with an MIB-1 of 61%, and no HER-2 amplification, the signals ratio being 0.90, and the number per cell 1.80.  On 11/02/2013 the patient underwent bilateral breast MRI and this showed, in the left breast, a 5.8 cm mass eroding through the overlying skin, and also invading the underlying pectoralis muscle and chest wall. There were multiple enhancing nodules throughout the left breast and an adjacent 4.8 cm irregular enhancing mass inferiorly. There are multiple large irregular left axillary lymph nodes, the largest measuring 2.4 cm. There was a 0.9 cm left subpectoral lymph node as well as prevascular and precarinal lymph nodes.  In the right breast far upper inner quadrant there was a 0.8 cm irregular enhancing mass abutting the overlying skin. This is the mass that was biopsied and shown to be positive. In addition there was patchy heterogeneous enhancement of the sternum.  The patient's subsequent history is as detailed below  INTERVAL HISTORY: Omya returns today for follow-up of her metastatic breast cancer, accompanied by her husband, [name].  At her last visit she was started on 1.5g capecitabine BID 1 week on, 1 week off. She finished her 7th day last Friday, and is due to start cycle 2 this Saturday. Overall she  is tolerating it well. She denies mucositis, pneumonitis symptoms, plantar palmar rashes, or diarrhea. She is mildly "queezy" but she wouldn't even call this nausea.  REVIEW OF SYSTEMS: Sidonia denies fever  or chills. She is chronically fatigued, but no more than usual. She uses tylenol PRN for her joint and back pain. Her appetite is decreased, but she does well with protein supplements and has been able to maintain her weight. She is short of breath with exertion, but denies cough, palpitations, or chest pain. She has no headaches, dizziness, vision changes, or weakness. A detailed review of systems is otherwise stable.  PAST MEDICAL HISTORY: Past Medical History  Diagnosis Date  . Urinary tract infection   . Anemia   . Breast cancer     T3N2M1 stage IV invasive ductal carcinoma   . Brain metastases   . Hydronephrosis, left   . Lung metastases   . Bone metastases     PAST SURGICAL HISTORY: Past Surgical History  Procedure Laterality Date  . Cataract extraction  2013  . Mouth surgery  1981    FAMILY HISTORY: The patient's father died at the age of 93, from pneumonia. The patient's mother lived to be 4. The patient had no brothers, one sister. There is no history of breast or ovarian cancer in the family  GYNECOLOGIC HISTORY:  Menarche age 66. The patient is GX P0. She stopped having periods approximately 2003. She did not take hormone replacement. She used birth control pills for approximately 8 years remotely, with no complications   SOCIAL HISTORY:  Lizabeth retired about a year ago. 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 Her husband [name] is retired from working in Char[employer] fundraiser. They live alone, with no pets.    ADVANCED DIRECTIVES: Not in place   HEALTH MAINTENANCE: History  Substance Use Topics  . Smoking status: Never Smoker   . Smokeless tobacco: Never Used  . Alcohol Use: Yes     Comment: 10 12 glasses/week     Colonoscopy: Never  PAP: 1990  Bone density: Never  Lipid panel:  No Known Allergies  Current Outpatient Prescriptions  Medication Sig Dispense Refill  . calcium carbonate (TUMS - DOSED IN MG ELEMENTAL CALCIUM) 500 MG  chewable tablet Chew 1 tablet by mouth daily.    . capecitabine (XELODA) 500 MG tablet Take 3 tablets (1,500 mg total) by mouth 2 (two) times daily after a meal. 84 tablet 6  . denosumab (XGEVA) 120 MG/1.7ML SOLN injection Inject 120 mg into the skin every 28 (twenty-eight) days.    . ALPRAZolam (XANAX) 0.5 MG tablet Take 1 tablet (0.5 mg total) by mouth at bedtime as needed for anxiety. (Patient not taking: Reported on 08/09/2014) 10 tablet 0  . darbepoetin (ARANESP) 100 MCG/0.5ML SOLN injection Inject 500 mcg into the skin.     Marland Kitchen loratadine (CLARITIN) 10 MG tablet Take 1 tablet (10 mg total) by mouth daily. (Patient not taking: Reported on 11/24/2014) 100 tablet 4   No current facility-administered medications for this visit.    OBJECTIVE: Middle-aged white woman in no acute distress Filed Vitals:   11/24/14 1527  BP: 142/74  Pulse: 101  Temp: 98.2 F (36.8 C)  Resp: 18     Body mass index is 18.9 kg/(m^2).    ECOG FS:1 - Symptomatic but completely ambulatory  Skin: warm, dry  HEENT: sclerae anicteric, conjunctivae pink, oropharynx clear. No thrush or mucositis.  Lymph Nodes: No cervical or supraclavicular lymphadenopathy  Lungs: clear to auscultation bilaterally, no rales, wheezes, or rhonci  Heart: regular rate and rhythm  Abdomen: round, soft, non tender, positive bowel sounds  Musculoskeletal: No focal spinal tenderness, no peripheral edema  Neuro: non focal, well oriented, positive affect  Breasts: deferred   LAB RESULTS:  CMP     Component Value Date/Time   NA 137 11/24/2014 1511   NA 134* 09/15/2014 1449   K 4.2 11/24/2014 1511   K 4.0 09/15/2014 1449   CL 101 09/15/2014 1449   CO2 24 11/24/2014 1511   CO2 22 09/15/2014 1449   GLUCOSE 97 11/24/2014 1511   GLUCOSE 130* 09/15/2014 1449   BUN 20.5 11/24/2014 1511   BUN 17 09/15/2014 1449   CREATININE 0.9 11/24/2014 1511   CREATININE 0.93 09/15/2014 1449   CALCIUM 8.7 11/24/2014 1511   CALCIUM 8.9 09/15/2014 1449    PROT 6.1* 11/24/2014 1511   PROT 6.6 09/15/2014 1449   ALBUMIN 2.6* 11/24/2014 1511   ALBUMIN 3.4* 09/15/2014 1449   AST 231* 11/24/2014 1511   AST 83* 09/15/2014 1449   ALT 56* 11/24/2014 1511   ALT 41* 09/15/2014 1449   ALKPHOS 562* 11/24/2014 1511   ALKPHOS 238* 09/15/2014 1449   BILITOT 1.14 11/24/2014 1511   BILITOT 0.6 09/15/2014 1449    I No results found for: SPEP  Lab Results  Component Value Date   WBC 4.8 11/24/2014   NEUTROABS 2.7 11/24/2014   HGB 10.5* 11/24/2014   HCT 32.8* 11/24/2014   MCV 92.1 11/24/2014   PLT 165 11/24/2014      Chemistry      Component Value Date/Time   NA 137 11/24/2014 1511   NA 134* 09/15/2014 1449   K 4.2 11/24/2014 1511   K 4.0 09/15/2014 1449   CL 101 09/15/2014 1449   CO2 24 11/24/2014 1511   CO2 22 09/15/2014 1449   BUN 20.5 11/24/2014 1511   BUN 17 09/15/2014 1449   CREATININE 0.9 11/24/2014 1511   CREATININE 0.93 09/15/2014 1449      Component Value Date/Time   CALCIUM 8.7 11/24/2014 1511   CALCIUM 8.9 09/15/2014 1449   ALKPHOS 562* 11/24/2014 1511   ALKPHOS 238* 09/15/2014 1449   AST 231* 11/24/2014 1511   AST 83* 09/15/2014 1449   ALT 56* 11/24/2014 1511   ALT 41* 09/15/2014 1449   BILITOT 1.14 11/24/2014 1511   BILITOT 0.6 09/15/2014 1449       No results found for: LABCA2  No components found for: [id number]  No results for input(s): INR in the last 168 hours.  Urinalysis No results found for: COLORURINE  STUDIES: Mr Liver W Wo Contrast  12/04/2014   CLINICAL DATA:  Breast cancer. Subsequent treatment evaluation. Hepatic metastasis.  EXAM: MRI ABDOMEN WITHOUT AND WITH CONTRAST  TECHNIQUE: Multiplanar multisequence MR imaging of the abdomen was performed both before and after the administration of intravenous contrast.  CONTRAST:  82m MULTIHANCE GADOBENATE DIMEGLUMINE 529 MG/ML IV SOLN  COMPARISON:  MRI 09/06/2014  FINDINGS: Lower chest: Bilateral pleural effusions and peripheral consolidation at the  right lung base (image 7, series 901). 6 mm pulmonary nodule at the left lung base on image number 4, series 4 is not changed from 8 mm on comparison PET-CT scan  Hepatobiliary: Again demonstrated bilateral enhancing hepatic metastases. These lesions are well demarcated on the fat-suppressed T2 weighted series 4.  Example lesion in the central right hepatic lobe measures 37 x 27 mm compared to 28 x 22 mm  for slight increase.  Inferior right hepatic lobe lesion measures 35 x 34 mm increased from 27 x 31 mm.  Posterior right lobe lesion measures 21 x 17 mm (image number 9 series 4) increased from 16 x 13 mm. Lesion in the left lateral hepatic lobe measures 24 mm increased from 19 mm. No new lesions are identified  The portal veins are patent. Gallbladder is normal. No biliary duct dilatation.  Pancreas: Normal pancreatic parenchymal intensity. No ductal dilatation or inflammation.  Spleen: Normal spleen.  Adrenals/urinary tract: Adrenal glands and kidneys are normal.  Stomach/Bowel: Stomach and limited of the small bowel is unremarkable  Vascular/Lymphatic: Abdominal aortic normal caliber. No retroperitoneal periportal lymphadenopathy.  Musculoskeletal: No aggressive osseous lesion  IMPRESSION: 1. Mild progression of multifocal hepatic metastasis. 2. New peripheral consolidation at the right lung base. Correlate clinically for pulmonary infection. 3. Stable sclerotic osseous metastasis.   Electronically Signed   By: Suzy Bouchard M.D.   On: 11/04/2014 11:05     ASSESSMENT: 69 y.o. [place] woman status post left breast and left axillary lymph node biopsy as well as right infraclavicular chest wall biopsy 10/23/2013 for a clinical T3, N2, M1, stage IV invasive ductal carcinoma, grade 2, estrogen receptor 100% positive, progesterone receptor 23% positive, with an MIB-1 of 61%, and no HER-2 amplification.  (1) staging studies 11/05/2013 showed metastatic involvement of bone, liver, lungs, and brain  (2)  antiestrogen therapy with letrozole and fulvestrant as well as targeted therapy with palbociclib started 11/11/2013, discontinued January 2015 with progression  (3) denosumab monthly started 11/11/2013  (4) s/p stereotactic radiosurgery to brain metastases 03/19/2014 - Left lateral cerebellar 4 mm to 20 Gy.   - Left medial cerebellar 3 mm to 20 Gy  (5) Right hydronephrosis w/o obstruction-- followed by urology ([facility])--resolved as of abd MRI 06/02/2014  (6) exemestane and everolimus started 06/07/2014, discontinued 11/10/2014 with rogression  (7) s/p further stereotactic radiosurgery 07/05/2014  R parietal 3 mm target: 20 Gy  R parietal 2 mm target: 20 Gy  (8) anemia of CKD;  started darbepoietin 08/04/2014; dose increased 09/15/2014 and again 11/10/2014; ferritin checked regularly  (9) capecitabine 1.5 g BID 7 days on/ 7days off started  PLAN: So far Timeka is tolerating the capecitabine well. The CBC was reviewed in detail. Her hgb is 10.5, so we will hold the aranesp today. She will receive the injection when her hgb is 10 or lower. The CMET was not yet available to review. She will restart the capecitabine on Saturday for her 2nd cycle. She will continue to be vigilant for side effects.   Belisa will return in 2 weeks prior to the start of cycle 3. She will be reevaluated for aranesp at that time, and will also receive her xgeva injection. She understands and agrees with this plan. She knows the goal of treatment in her case is control. She has been encouraged to call with any issues that might arise before her next visit here.  Laurie Panda, NP

## 2014-11-24 NOTE — Telephone Encounter (Signed)
Appointments made and avs printed for patient °

## 2014-11-25 LAB — FERRITIN CHCC: Ferritin: 1022 ng/ml — ABNORMAL HIGH (ref 9–269)

## 2014-12-08 ENCOUNTER — Other Ambulatory Visit: Payer: Medicare Other

## 2014-12-08 ENCOUNTER — Ambulatory Visit: Payer: Medicare Other

## 2014-12-09 ENCOUNTER — Encounter: Payer: Self-pay | Admitting: Oncology

## 2014-12-09 ENCOUNTER — Ambulatory Visit (HOSPITAL_BASED_OUTPATIENT_CLINIC_OR_DEPARTMENT_OTHER): Payer: Medicare Other

## 2014-12-09 ENCOUNTER — Other Ambulatory Visit (HOSPITAL_BASED_OUTPATIENT_CLINIC_OR_DEPARTMENT_OTHER): Payer: Medicare Other

## 2014-12-09 ENCOUNTER — Ambulatory Visit (HOSPITAL_BASED_OUTPATIENT_CLINIC_OR_DEPARTMENT_OTHER): Payer: Medicare Other | Admitting: Oncology

## 2014-12-09 VITALS — BP 136/67 | HR 89 | Temp 98.1°F | Resp 18 | Ht 65.0 in | Wt 111.7 lb

## 2014-12-09 DIAGNOSIS — Z170 Estrogen receptor positive status [ER+]: Secondary | ICD-10-CM | POA: Diagnosis not present

## 2014-12-09 DIAGNOSIS — C7800 Secondary malignant neoplasm of unspecified lung: Secondary | ICD-10-CM | POA: Diagnosis not present

## 2014-12-09 DIAGNOSIS — C7931 Secondary malignant neoplasm of brain: Secondary | ICD-10-CM

## 2014-12-09 DIAGNOSIS — N189 Chronic kidney disease, unspecified: Secondary | ICD-10-CM

## 2014-12-09 DIAGNOSIS — C50412 Malignant neoplasm of upper-outer quadrant of left female breast: Secondary | ICD-10-CM

## 2014-12-09 DIAGNOSIS — C7951 Secondary malignant neoplasm of bone: Secondary | ICD-10-CM

## 2014-12-09 DIAGNOSIS — C787 Secondary malignant neoplasm of liver and intrahepatic bile duct: Secondary | ICD-10-CM | POA: Diagnosis not present

## 2014-12-09 DIAGNOSIS — C50912 Malignant neoplasm of unspecified site of left female breast: Secondary | ICD-10-CM

## 2014-12-09 DIAGNOSIS — D631 Anemia in chronic kidney disease: Secondary | ICD-10-CM

## 2014-12-09 DIAGNOSIS — C7989 Secondary malignant neoplasm of other specified sites: Secondary | ICD-10-CM

## 2014-12-09 DIAGNOSIS — C773 Secondary and unspecified malignant neoplasm of axilla and upper limb lymph nodes: Secondary | ICD-10-CM

## 2014-12-09 DIAGNOSIS — C50919 Malignant neoplasm of unspecified site of unspecified female breast: Secondary | ICD-10-CM

## 2014-12-09 DIAGNOSIS — N1330 Unspecified hydronephrosis: Secondary | ICD-10-CM

## 2014-12-09 LAB — COMPREHENSIVE METABOLIC PANEL (CC13)
ALK PHOS: 525 U/L — AB (ref 40–150)
ALT: 54 U/L (ref 0–55)
AST: 154 U/L — ABNORMAL HIGH (ref 5–34)
Albumin: 2.7 g/dL — ABNORMAL LOW (ref 3.5–5.0)
Anion Gap: 8 mEq/L (ref 3–11)
BILIRUBIN TOTAL: 1.36 mg/dL — AB (ref 0.20–1.20)
BUN: 22.1 mg/dL (ref 7.0–26.0)
CO2: 25 mEq/L (ref 22–29)
Calcium: 9.1 mg/dL (ref 8.4–10.4)
Chloride: 105 mEq/L (ref 98–109)
Creatinine: 0.9 mg/dL (ref 0.6–1.1)
EGFR: 62 mL/min/{1.73_m2} — ABNORMAL LOW (ref >90)
Glucose: 94 mg/dl (ref 70–140)
Potassium: 4.8 mEq/L (ref 3.5–5.1)
SODIUM: 138 meq/L (ref 136–145)
Total Protein: 6.2 g/dL — ABNORMAL LOW (ref 6.4–8.3)

## 2014-12-09 LAB — CBC WITH DIFFERENTIAL/PLATELET
BASO%: 1.3 % (ref 0.0–2.0)
Basophils Absolute: 0.1 10*3/uL (ref 0.0–0.1)
EOS ABS: 0.1 10*3/uL (ref 0.0–0.5)
EOS%: 2.2 % (ref 0.0–7.0)
HCT: 31.2 % — ABNORMAL LOW (ref 34.8–46.6)
HGB: 9.8 g/dL — ABNORMAL LOW (ref 11.6–15.9)
LYMPH%: 24.4 % (ref 14.0–49.7)
MCH: 30.7 pg (ref 25.1–34.0)
MCHC: 31.4 g/dL — ABNORMAL LOW (ref 31.5–36.0)
MCV: 97.8 fL (ref 79.5–101.0)
MONO#: 1 10*3/uL — AB (ref 0.1–0.9)
MONO%: 22.2 % — ABNORMAL HIGH (ref 0.0–14.0)
NEUT%: 49.9 % (ref 38.4–76.8)
NEUTROS ABS: 2.3 10*3/uL (ref 1.5–6.5)
Platelets: 134 10*3/uL — ABNORMAL LOW (ref 145–400)
RBC: 3.19 10*6/uL — AB (ref 3.70–5.45)
RDW: 23.7 % — ABNORMAL HIGH (ref 11.2–14.5)
WBC: 4.6 10*3/uL (ref 3.9–10.3)
lymph#: 1.1 10*3/uL (ref 0.9–3.3)
nRBC: 2 % — ABNORMAL HIGH (ref 0–0)

## 2014-12-09 LAB — TECHNOLOGIST REVIEW

## 2014-12-09 MED ORDER — DARBEPOETIN ALFA 500 MCG/ML IJ SOSY
500.0000 ug | PREFILLED_SYRINGE | Freq: Once | INTRAMUSCULAR | Status: AC
  Administered 2014-12-09: 500 ug via SUBCUTANEOUS
  Filled 2014-12-09: qty 1

## 2014-12-09 MED ORDER — DENOSUMAB 120 MG/1.7ML ~~LOC~~ SOLN
120.0000 mg | Freq: Once | SUBCUTANEOUS | Status: AC
  Administered 2014-12-09: 120 mg via SUBCUTANEOUS
  Filled 2014-12-09: qty 1.7

## 2014-12-09 NOTE — Progress Notes (Signed)
Pt is approved with [employer] for [employer] from 12/03/14 to 12/02/15 or when the benefit cap has been met.  PAN has a 90 day look back period.  The amount of the grant is $12,000.

## 2014-12-09 NOTE — Progress Notes (Signed)
[facility]  Telephone:(336) [phone] Fax:(336) [phone]     ID: Vicki Marshall OB: 27-Jul-1955  MR#: [id number]  WYO#:[id number]  PCP: Lilian Coma, MD GYN:   SU: Excell Seltzer OTHER MD: Arloa Koh, Altamese Cabal, Scott Minor DDS, Tyler Pita, Ottis Stain  CHIEF COMPLAINT: stage IV breast cancer, estrogen receptor positive  TREATMENT: capecitabine, aranesp, and denosumab  BREAST CANCER HISTORY: From the original intake note 11/04/2013:  Vicki Marshall noted a change in her left breast approximately mid 2014. This was a flattening of the lateral aspect of the left breast. Eventually she noted an indentation and eventually a "sore" in that area. She had not shared this information with her husband, and she did not have a primary care physician. More recently, as she started to have symptoms related to this, she mentioned it to her husband [name] and she establish yourself with Dr. Stephanie Acre, who set her up for bilateral diagnostic mammography and ultrasonography at the [facility] 10/23/2013.. This showed a breast density to be category C. The right breast was negative.  The left breast contained an ulcerated mass laterally measuring at least 4 cm mammography. On physical exam there was an ulcerated mass in the 2:00 location of the left breast, associated with erythematous skin nodules in the upper inner quadrant. The left breast was smaller than the right, and firm. There were palpable left axillary lymph nodes. In addition, on the right upper chest wall, infraclavicular like, there was a firm nodule separate from the bony structures. There was also a right preauricular node and a firm left submandibular node.  Ultrasound showed multiple nodules throughout the lower portion of the left breast. There was a large mass in the upper outer quadrant of the left breast contiguous with the skin ulceration, measuring greater than 5 cm. The left axilla  showed at least 3 suspicious masses, the largest measuring 1.9 cm. Ultrasound of the palpable abnormality of the right chest wall showed a superficial irregular mass, subdermal, measuring 0.8 cm.  Biopsies of the left breast mass, a left axillary lymph node, and the right infra-clavicular mass on 10/23/2013, all showed ([id number]) and invasive ductal carcinoma, grade 1 or 2, estrogen receptor 100% positive, progesterone receptor 23% positive, both with strong staining intensity, with an MIB-1 of 61%, and no HER-2 amplification, the signals ratio being 0.90, and the number per cell 1.80.  On 11/02/2013 the patient underwent bilateral breast MRI and this showed, in the left breast, a 5.8 cm mass eroding through the overlying skin, and also invading the underlying pectoralis muscle and chest wall. There were multiple enhancing nodules throughout the left breast and an adjacent 4.8 cm irregular enhancing mass inferiorly. There are multiple large irregular left axillary lymph nodes, the largest measuring 2.4 cm. There was a 0.9 cm left subpectoral lymph node as well as prevascular and precarinal lymph nodes.  In the right breast far upper inner quadrant there was a 0.8 cm irregular enhancing mass abutting the overlying skin. This is the mass that was biopsied and shown to be positive. In addition there was patchy heterogeneous enhancement of the sternum.  The patient's subsequent history is as detailed below  INTERVAL HISTORY: Vicki Marshall returns today for follow-up of her metastatic breast cancer, accompanied by her husband, [name].  She has completed her second cycle of capecitabine and is tolerating it well per she has had no mouth sores, no palmar or plantar erythrodysesthesia, and no diarrhea. In fact her bowel movements are now  closer to normal. She has had some minor nail changes.  REVIEW OF SYSTEMS: Vicki Marshall does feel tired. She feels better on the off week from capecitabine. The fatigue of course is  also related to her anemia. She has less cough, no shortness of breath, and her sense of taste is okay. She has no nausea and no vomiting. She just doesn't feel hungry. She is struggling to maintain her weight. She keeps herself well-hydrated. There have been no unusual headaches, visual changes, or problems with balance. A detailed review of systems today was otherwise stable.  PAST MEDICAL HISTORY: Past Medical History  Diagnosis Date  . Urinary tract infection   . Anemia   . Breast cancer     T3N2M1 stage IV invasive ductal carcinoma   . Brain metastases   . Hydronephrosis, left   . Lung metastases   . Bone metastases     PAST SURGICAL HISTORY: Past Surgical History  Procedure Laterality Date  . Cataract extraction  2013  . Mouth surgery  1981    FAMILY HISTORY: The patient's father died at the age of 85, from pneumonia. The patient's mother lived to be 69. The patient had no brothers, one sister. There is no history of breast or ovarian cancer in the family  GYNECOLOGIC HISTORY:  Menarche age 69. The patient is GX P0. She stopped having periods approximately 2003. She did not take hormone replacement. She used birth control pills for approximately 8 years remotely, with no complications   SOCIAL HISTORY:  [place] retired about a year ago. 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 Her husband [name] is retired from working in Char[employer] fundraiser. They live alone, with no pets.    ADVANCED DIRECTIVES: Not in place   HEALTH MAINTENANCE: History  Substance Use Topics  . Smoking status: Never Smoker   . Smokeless tobacco: Never Used  . Alcohol Use: Yes     Comment: 10 12 glasses/week     Colonoscopy: Never  PAP: 1990  Bone density: Never  Lipid panel:  No Known Allergies  Current Outpatient Prescriptions  Medication Sig Dispense Refill  . ALPRAZolam (XANAX) 0.5 MG tablet Take 1 tablet (0.5 mg total) by mouth at bedtime as needed for anxiety.  (Patient not taking: Reported on 08/09/2014) 10 tablet 0  . calcium carbonate (TUMS - DOSED IN MG ELEMENTAL CALCIUM) 500 MG chewable tablet Chew 1 tablet by mouth daily.    . capecitabine (XELODA) 500 MG tablet Take 3 tablets (1,500 mg total) by mouth 2 (two) times daily after a meal. 84 tablet 6  . darbepoetin (ARANESP) 100 MCG/0.5ML SOLN injection Inject 500 mcg into the skin.     Marland Kitchen denosumab (XGEVA) 120 MG/1.7ML SOLN injection Inject 120 mg into the skin every 28 (twenty-eight) days.    Marland Kitchen loratadine (CLARITIN) 10 MG tablet Take 1 tablet (10 mg total) by mouth daily. (Patient not taking: Reported on 11/24/2014) 100 tablet 4   No current facility-administered medications for this visit.    OBJECTIVE: Middle-aged white woman who appears stated age 29 Vitals:   12/09/14 1050  BP: 136/67  Pulse: 89  Temp: 98.1 F (36.7 C)  Resp: 18     Body mass index is 18.59 kg/(m^2).    ECOG FS:1 - Symptomatic but completely ambulatory  Sclerae unicteric, EOMs intact Oropharynx clear, dentition in good repair No cervical or supraclavicular adenopathy Lungs no rales or rhonchi Heart regular rate and rhythm Abd soft, nontender, positive bowel sounds  MSK no focal spinal tenderness, no upper extremity lymphedema Neuro: nonfocal, well oriented, appropriate affect Breasts: Deferred Skin: No erythema or peeling over the palmar aspects of the hands   LAB RESULTS:  CMP     Component Value Date/Time   NA 137 11/24/2014 1511   NA 134* 09/15/2014 1449   K 4.2 11/24/2014 1511   K 4.0 09/15/2014 1449   CL 101 09/15/2014 1449   CO2 24 11/24/2014 1511   CO2 22 09/15/2014 1449   GLUCOSE 97 11/24/2014 1511   GLUCOSE 130* 09/15/2014 1449   BUN 20.5 11/24/2014 1511   BUN 17 09/15/2014 1449   CREATININE 0.9 11/24/2014 1511   CREATININE 0.93 09/15/2014 1449   CALCIUM 8.7 11/24/2014 1511   CALCIUM 8.9 09/15/2014 1449   PROT 6.1* 11/24/2014 1511   PROT 6.6 09/15/2014 1449   ALBUMIN 2.6* 11/24/2014 1511    ALBUMIN 3.4* 09/15/2014 1449   AST 231* 11/24/2014 1511   AST 83* 09/15/2014 1449   ALT 56* 11/24/2014 1511   ALT 41* 09/15/2014 1449   ALKPHOS 562* 11/24/2014 1511   ALKPHOS 238* 09/15/2014 1449   BILITOT 1.14 11/24/2014 1511   BILITOT 0.6 09/15/2014 1449    I No results found for: SPEP  Lab Results  Component Value Date   WBC 4.6 12/09/2014   NEUTROABS 2.3 12/09/2014   HGB 9.8* 12/09/2014   HCT 31.2* 12/09/2014   MCV 97.8 12/09/2014   PLT 134* 12/09/2014      Chemistry      Component Value Date/Time   NA 137 11/24/2014 1511   NA 134* 09/15/2014 1449   K 4.2 11/24/2014 1511   K 4.0 09/15/2014 1449   CL 101 09/15/2014 1449   CO2 24 11/24/2014 1511   CO2 22 09/15/2014 1449   BUN 20.5 11/24/2014 1511   BUN 17 09/15/2014 1449   CREATININE 0.9 11/24/2014 1511   CREATININE 0.93 09/15/2014 1449      Component Value Date/Time   CALCIUM 8.7 11/24/2014 1511   CALCIUM 8.9 09/15/2014 1449   ALKPHOS 562* 11/24/2014 1511   ALKPHOS 238* 09/15/2014 1449   AST 231* 11/24/2014 1511   AST 83* 09/15/2014 1449   ALT 56* 11/24/2014 1511   ALT 41* 09/15/2014 1449   BILITOT 1.14 11/24/2014 1511   BILITOT 0.6 09/15/2014 1449       No results found for: LABCA2  No components found for: [id number]  No results for input(s): INR in the last 168 hours.  Urinalysis No results found for: COLORURINE  STUDIES: No results found.   ASSESSMENT: 69 y.o. [place] woman status post left breast and left axillary lymph node biopsy as well as right infraclavicular chest wall biopsy 10/23/2013 for a clinical T3, N2, M1, stage IV invasive ductal carcinoma, grade 2, estrogen receptor 100% positive, progesterone receptor 23% positive, with an MIB-1 of 61%, and no HER-2 amplification.  (1) staging studies 11/05/2013 showed metastatic involvement of bone, liver, lungs, and brain  (2) antiestrogen therapy with letrozole and fulvestrant as well as targeted therapy with palbociclib started  11/11/2013, discontinued January 2015 with progression  (3) denosumab monthly started 11/11/2013  (4) s/p stereotactic radiosurgery to brain metastases 03/19/2014 - Left lateral cerebellar 4 mm to 20 Gy.   - Left medial cerebellar 3 mm to 20 Gy  (5) Right hydronephrosis w/o obstruction-- followed by urology ([facility])--resolved as of abd MRI 06/02/2014  (6) exemestane and everolimus started 06/07/2014, discontinued 11/10/2014 with progression  (7) s/p further stereotactic radiosurgery 07/05/2014  R parietal 3 mm target: 20 Gy  R parietal 2 mm target: 20 Gy  (8) anemia of CKD;  started darbepoietin 08/04/2014; dose increased 09/15/2014 and again 11/10/2014; ferritin checked regularly  (9) capecitabine 1.5 g BID 7 days on/ 7days off started 11/13/2014  PLAN: Orion is tolerating the capecitabine remarkably well. We are going to try to do a total of 6 cycles, or 3 months, before repeating a liver MRI.  She is scheduled for a brain MRI August 11, and will see Dr. Tammi Klippel to review those results August 15. I would like to see her August 17 but had no availability, so I have made her a one-hour appointment with my 107 assistant for August 17 and I would like to see her then to discuss results of the brain MRI and its implications. If all is going well we will set her up then for a liver MRI before her mid September this.  [place] has a good understanding of the possible toxicities, side effects and complications of capecitabine and knows to call us if she develops any of them. We are also trying to boost her red cells with Aranesp, but if she becomes very fatigue or short of breath we will transfuse as needed.   Chauncey Cruel, MD

## 2014-12-09 NOTE — Progress Notes (Signed)
Aranesp injection given by infusion nurse. 

## 2014-12-22 ENCOUNTER — Encounter: Payer: Self-pay | Admitting: Nurse Practitioner

## 2014-12-22 ENCOUNTER — Other Ambulatory Visit: Payer: Medicare Other

## 2014-12-22 ENCOUNTER — Ambulatory Visit: Payer: Medicare Other

## 2014-12-22 ENCOUNTER — Ambulatory Visit (HOSPITAL_BASED_OUTPATIENT_CLINIC_OR_DEPARTMENT_OTHER): Payer: Medicare Other | Admitting: Nurse Practitioner

## 2014-12-22 ENCOUNTER — Ambulatory Visit (HOSPITAL_BASED_OUTPATIENT_CLINIC_OR_DEPARTMENT_OTHER): Payer: Medicare Other

## 2014-12-22 ENCOUNTER — Other Ambulatory Visit (HOSPITAL_BASED_OUTPATIENT_CLINIC_OR_DEPARTMENT_OTHER): Payer: Medicare Other

## 2014-12-22 VITALS — BP 131/68 | HR 98 | Temp 97.8°F | Resp 18 | Ht 65.0 in | Wt 112.2 lb

## 2014-12-22 DIAGNOSIS — C7931 Secondary malignant neoplasm of brain: Secondary | ICD-10-CM | POA: Diagnosis not present

## 2014-12-22 DIAGNOSIS — C7951 Secondary malignant neoplasm of bone: Secondary | ICD-10-CM

## 2014-12-22 DIAGNOSIS — C7989 Secondary malignant neoplasm of other specified sites: Secondary | ICD-10-CM | POA: Diagnosis not present

## 2014-12-22 DIAGNOSIS — D649 Anemia, unspecified: Secondary | ICD-10-CM

## 2014-12-22 DIAGNOSIS — C50912 Malignant neoplasm of unspecified site of left female breast: Secondary | ICD-10-CM

## 2014-12-22 DIAGNOSIS — C50412 Malignant neoplasm of upper-outer quadrant of left female breast: Secondary | ICD-10-CM

## 2014-12-22 DIAGNOSIS — C787 Secondary malignant neoplasm of liver and intrahepatic bile duct: Secondary | ICD-10-CM

## 2014-12-22 DIAGNOSIS — D630 Anemia in neoplastic disease: Secondary | ICD-10-CM

## 2014-12-22 DIAGNOSIS — C7800 Secondary malignant neoplasm of unspecified lung: Secondary | ICD-10-CM

## 2014-12-22 DIAGNOSIS — N189 Chronic kidney disease, unspecified: Secondary | ICD-10-CM

## 2014-12-22 DIAGNOSIS — C773 Secondary and unspecified malignant neoplasm of axilla and upper limb lymph nodes: Secondary | ICD-10-CM

## 2014-12-22 DIAGNOSIS — N1330 Unspecified hydronephrosis: Secondary | ICD-10-CM

## 2014-12-22 DIAGNOSIS — C50919 Malignant neoplasm of unspecified site of unspecified female breast: Secondary | ICD-10-CM

## 2014-12-22 LAB — CBC & DIFF AND RETIC
BASO%: 1.1 % (ref 0.0–2.0)
Basophils Absolute: 0 10*3/uL (ref 0.0–0.1)
EOS%: 2.3 % (ref 0.0–7.0)
Eosinophils Absolute: 0.1 10*3/uL (ref 0.0–0.5)
HCT: 31 % — ABNORMAL LOW (ref 34.8–46.6)
HGB: 9.7 g/dL — ABNORMAL LOW (ref 11.6–15.9)
Immature Retic Fract: 24.9 % — ABNORMAL HIGH (ref 1.60–10.00)
LYMPH#: 1 10*3/uL (ref 0.9–3.3)
LYMPH%: 29.8 % (ref 14.0–49.7)
MCH: 31.9 pg (ref 25.1–34.0)
MCHC: 31.3 g/dL — ABNORMAL LOW (ref 31.5–36.0)
MCV: 102 fL — ABNORMAL HIGH (ref 79.5–101.0)
MONO#: 0.6 10*3/uL (ref 0.1–0.9)
MONO%: 17.8 % — ABNORMAL HIGH (ref 0.0–14.0)
NEUT#: 1.7 10*3/uL (ref 1.5–6.5)
NEUT%: 49 % (ref 38.4–76.8)
PLATELETS: 109 10*3/uL — AB (ref 145–400)
RBC: 3.04 10*6/uL — ABNORMAL LOW (ref 3.70–5.45)
RDW: 27.4 % — AB (ref 11.2–14.5)
RETIC CT ABS: 142.58 10*3/uL — AB (ref 33.70–90.70)
Retic %: 4.69 % — ABNORMAL HIGH (ref 0.70–2.10)
WBC: 3.5 10*3/uL — ABNORMAL LOW (ref 3.9–10.3)

## 2014-12-22 LAB — COMPREHENSIVE METABOLIC PANEL (CC13)
ALK PHOS: 524 U/L — AB (ref 40–150)
ALT: 48 U/L (ref 0–55)
AST: 127 U/L — ABNORMAL HIGH (ref 5–34)
Albumin: 2.8 g/dL — ABNORMAL LOW (ref 3.5–5.0)
Anion Gap: 7 mEq/L (ref 3–11)
BILIRUBIN TOTAL: 1.77 mg/dL — AB (ref 0.20–1.20)
BUN: 18.8 mg/dL (ref 7.0–26.0)
CO2: 24 mEq/L (ref 22–29)
CREATININE: 1 mg/dL (ref 0.6–1.1)
Calcium: 8 mg/dL — ABNORMAL LOW (ref 8.4–10.4)
Chloride: 107 mEq/L (ref 98–109)
EGFR: 59 mL/min/{1.73_m2} — AB (ref >90)
GLUCOSE: 167 mg/dL — AB (ref 70–140)
Potassium: 4.9 mEq/L (ref 3.5–5.1)
Sodium: 138 mEq/L (ref 136–145)
TOTAL PROTEIN: 6.2 g/dL — AB (ref 6.4–8.3)

## 2014-12-22 LAB — TECHNOLOGIST REVIEW

## 2014-12-22 LAB — FERRITIN CHCC: Ferritin: 1235 ng/ml — ABNORMAL HIGH (ref 9–269)

## 2014-12-22 MED ORDER — DARBEPOETIN ALFA 500 MCG/ML IJ SOSY
500.0000 ug | PREFILLED_SYRINGE | Freq: Once | INTRAMUSCULAR | Status: AC
  Administered 2014-12-22: 500 ug via SUBCUTANEOUS
  Filled 2014-12-22: qty 1

## 2014-12-22 NOTE — Progress Notes (Signed)
[facility] [place]  Telephone:(336) [phone] Fax:(336) [phone]     ID: Vicki Marshall OB: [birth year]  MR#: [id number]  SFK#:[id number]  PCP: Vicki Coma, MD GYN:   SU: Vicki Marshall OTHER MD: Vicki Marshall, Vicki Marshall, Vicki Marshall DDS, Vicki Marshall, Vicki Marshall  CHIEF COMPLAINT: stage IV breast cancer, estrogen receptor positive  TREATMENT: capecitabine, aranesp, and denosumab  BREAST CANCER HISTORY: From the original intake note 11/04/2013:  Vicki Marshall noted a change in her left breast approximately mid 2014. This was a flattening of the lateral aspect of the left breast. Eventually she noted an indentation and eventually a "sore" in that area. She had not shared this information with her husband, and she did not have a primary care physician. More recently, as she started to have symptoms related to this, she mentioned it to her husband Vicki Marshall and she establish yourself with Dr. Stephanie Marshall, who set her up for bilateral diagnostic mammography and ultrasonography at the [facility] 10/23/2013.. This showed a breast density to be category C. The right breast was negative.  The left breast contained an ulcerated mass laterally measuring at least 4 cm mammography. On physical exam there was an ulcerated mass in the 2:00 location of the left breast, associated with erythematous skin nodules in the upper inner quadrant. The left breast was smaller than the right, and firm. There were palpable left axillary lymph nodes. In addition, on the right upper chest wall, infraclavicular like, there was a firm nodule separate from the bony structures. There was also a right preauricular node and a firm left submandibular node.  Ultrasound showed multiple nodules throughout the lower portion of the left breast. There was a large mass in the upper outer quadrant of the left breast contiguous with the skin ulceration, measuring greater than 5 cm. The left axilla  showed at least 3 suspicious masses, the largest measuring 1.9 cm. Ultrasound of the palpable abnormality of the right chest wall showed a superficial irregular mass, subdermal, measuring 0.8 cm.  Biopsies of the left breast mass, a left axillary lymph node, and the right infra-clavicular mass on 10/23/2013, all showed ([id number]) and invasive ductal carcinoma, grade 1 or 2, estrogen receptor 100% positive, progesterone receptor 23% positive, both with strong staining intensity, with an MIB-1 of 61%, and no HER-2 amplification, the signals ratio being 0.90, and the number per cell 1.80.  On 11/02/2013 the patient underwent bilateral breast MRI and this showed, in the left breast, a 5.8 cm mass eroding through the overlying skin, and also invading the underlying pectoralis muscle and chest wall. There were multiple enhancing nodules throughout the left breast and an adjacent 4.8 cm irregular enhancing mass inferiorly. There are multiple large irregular left axillary lymph nodes, the largest measuring 2.4 cm. There was a 0.9 cm left subpectoral lymph node as well as prevascular and precarinal lymph nodes.  In the right breast far upper inner quadrant there was a 0.8 cm irregular enhancing mass abutting the overlying skin. This is the mass that was biopsied and shown to be positive. In addition there was patchy heterogeneous enhancement of the sternum.  The patient's subsequent history is as detailed below  INTERVAL HISTORY: Vicki Marshall returns today for follow-up of her metastatic breast cancer, accompanied by her husband, Vicki Marshall.  Today is day 12, cycle 3 of capecitabine 1.27m taken BID 7 days on, 7 days off. This cycle so far has been the best of them all. She denies mouth sores, palmar/plantar rashes or  peeling, and diarrhea. She has some fatigue on the week on the drug, but the following week she regains her energy.   REVIEW OF SYSTEMS: Juliette denies fevers, chills, nausea, or vomiting. Her appetite is  fair, and she is supplementing with ice cream and ensure. She is managing the mild back pain with tylenol alone. She has some trace edema to her bilateral ankles at the end of the day, but this resolves with elevation. She denies headaches, dizziness, weakness, or vision changes. A detailed review of systems is otherwise stable.  PAST MEDICAL HISTORY: Past Medical History  Diagnosis Date  . Urinary tract infection   . Anemia   . Breast cancer     T3N2M1 stage IV invasive ductal carcinoma   . Brain metastases   . Hydronephrosis, left   . Lung metastases   . Bone metastases     PAST SURGICAL HISTORY: Past Surgical History  Procedure Laterality Date  . Cataract extraction  2013  . Mouth surgery  1981    FAMILY HISTORY: The patient's father died at the age of 40, from pneumonia. The patient's mother lived to be 66. The patient had no brothers, one sister. There is no history of breast or ovarian cancer in the family  GYNECOLOGIC HISTORY:  Menarche age 16. The patient is GX P0. She stopped having periods approximately 2003. She did not take hormone replacement. She used birth control pills for approximately 8 years remotely, with no complications   SOCIAL HISTORY:  [place] retired about a year ago. 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 Her husband Vicki Marshall is retired from working in Char[employer] fundraiser. They live alone, with no pets.    ADVANCED DIRECTIVES: Not in place   HEALTH MAINTENANCE: History  Substance Use Topics  . Smoking status: Never Smoker   . Smokeless tobacco: Never Used  . Alcohol Use: Yes     Comment: 10 12 glasses/week     Colonoscopy: Never  PAP: 1990  Bone density: Never  Lipid panel:  No Known Allergies  Current Outpatient Prescriptions  Medication Sig Dispense Refill  . calcium carbonate (TUMS - DOSED IN MG ELEMENTAL CALCIUM) 500 MG chewable tablet Chew 1 tablet by mouth daily.    . capecitabine (XELODA) 500 MG tablet Take 3  tablets (1,500 mg total) by mouth 2 (two) times daily after a meal. 84 tablet 6  . darbepoetin (ARANESP) 100 MCG/0.5ML SOLN injection Inject 500 mcg into the skin.     Marland Kitchen denosumab (XGEVA) 120 MG/1.7ML SOLN injection Inject 120 mg into the skin every 28 (twenty-eight) days.    . ALPRAZolam (XANAX) 0.5 MG tablet Take 1 tablet (0.5 mg total) by mouth at bedtime as needed for anxiety. (Patient not taking: Reported on 08/09/2014) 10 tablet 0  . loratadine (CLARITIN) 10 MG tablet Take 1 tablet (10 mg total) by mouth daily. (Patient not taking: Reported on 11/24/2014) 100 tablet 4   No current facility-administered medications for this visit.    OBJECTIVE: Middle-aged white woman who appears stated age 52 Vitals:   12/22/14 1403  BP: 131/68  Pulse: 98  Temp: 97.8 F (36.6 C)  Resp: 18     Body mass index is 18.67 kg/(m^2).    ECOG FS:1 - Symptomatic but completely ambulatory   Skin: warm, dry, no rash HEENT: sclerae anicteric, conjunctivae pink, oropharynx clear. No thrush or mucositis.  Lymph Nodes: No cervical or supraclavicular lymphadenopathy  Lungs: clear to auscultation bilaterally, no rales, wheezes, or  rhonci  Heart: regular rate and rhythm  Abdomen: round, soft, non tender, positive bowel sounds  Musculoskeletal: No focal spinal tenderness, no peripheral edema  Neuro: non focal, well oriented, positive affect  Breasts: deferred   LAB RESULTS:  CMP     Component Value Date/Time   NA 138 12/09/2014 1037   NA 134* 09/15/2014 1449   K 4.8 12/09/2014 1037   K 4.0 09/15/2014 1449   CL 101 09/15/2014 1449   CO2 25 12/09/2014 1037   CO2 22 09/15/2014 1449   GLUCOSE 94 12/09/2014 1037   GLUCOSE 130* 09/15/2014 1449   BUN 22.1 12/09/2014 1037   BUN 17 09/15/2014 1449   CREATININE 0.9 12/09/2014 1037   CREATININE 0.93 09/15/2014 1449   CALCIUM 9.1 12/09/2014 1037   CALCIUM 8.9 09/15/2014 1449   PROT 6.2* 12/09/2014 1037   PROT 6.6 09/15/2014 1449   ALBUMIN 2.7* 12/09/2014  1037   ALBUMIN 3.4* 09/15/2014 1449   AST 154* 12/09/2014 1037   AST 83* 09/15/2014 1449   ALT 54 12/09/2014 1037   ALT 41* 09/15/2014 1449   ALKPHOS 525* 12/09/2014 1037   ALKPHOS 238* 09/15/2014 1449   BILITOT 1.36* 12/09/2014 1037   BILITOT 0.6 09/15/2014 1449    I No results found for: SPEP  Lab Results  Component Value Date   WBC 3.5* 12/22/2014   NEUTROABS 1.7 12/22/2014   HGB 9.7* 12/22/2014   HCT 31.0* 12/22/2014   MCV 102.0* 12/22/2014   PLT 109* 12/22/2014      Chemistry      Component Value Date/Time   NA 138 12/09/2014 1037   NA 134* 09/15/2014 1449   K 4.8 12/09/2014 1037   K 4.0 09/15/2014 1449   CL 101 09/15/2014 1449   CO2 25 12/09/2014 1037   CO2 22 09/15/2014 1449   BUN 22.1 12/09/2014 1037   BUN 17 09/15/2014 1449   CREATININE 0.9 12/09/2014 1037   CREATININE 0.93 09/15/2014 1449      Component Value Date/Time   CALCIUM 9.1 12/09/2014 1037   CALCIUM 8.9 09/15/2014 1449   ALKPHOS 525* 12/09/2014 1037   ALKPHOS 238* 09/15/2014 1449   AST 154* 12/09/2014 1037   AST 83* 09/15/2014 1449   ALT 54 12/09/2014 1037   ALT 41* 09/15/2014 1449   BILITOT 1.36* 12/09/2014 1037   BILITOT 0.6 09/15/2014 1449       No results found for: LABCA2  No components found for: [id number]  No results for input(s): INR in the last 168 hours.  Urinalysis No results found for: COLORURINE  STUDIES: No results found.   ASSESSMENT: 69 y.o. [place] woman status post left breast and left axillary lymph node biopsy as well as right infraclavicular chest wall biopsy 10/23/2013 for a clinical T3, N2, M1, stage IV invasive ductal carcinoma, grade 2, estrogen receptor 100% positive, progesterone receptor 23% positive, with an MIB-1 of 61%, and no HER-2 amplification.  (1) staging studies 11/05/2013 showed metastatic involvement of bone, liver, lungs, and brain  (2) antiestrogen therapy with letrozole and fulvestrant as well as targeted therapy with palbociclib  started 11/11/2013, discontinued January 2015 with progression  (3) denosumab monthly started 11/11/2013  (4) s/p stereotactic radiosurgery to brain metastases 03/19/2014 - Left lateral cerebellar 4 mm to 20 Gy.   - Left medial cerebellar 3 mm to 20 Gy  (5) Right hydronephrosis w/o obstruction-- followed by urology ([facility])--resolved as of abd MRI 06/02/2014  (6) exemestane and everolimus started 06/07/2014, discontinued 11/10/2014 with progression  (  7) s/p further stereotactic radiosurgery 07/05/2014  R parietal 3 mm target: 20 Gy  R parietal 2 mm target: 20 Gy  (8) anemia of CKD;  started darbepoietin 08/04/2014; dose increased 09/15/2014 and again 11/10/2014; ferritin checked regularly  (9) capecitabine 1.5 g BID 7 days on/ 7days off started 11/13/2014  PLAN: Chelise continues to perform well on the capecitabine. She will start cycle 4 this Saturday. The labs were reviewed in detail, and her hgb is steady at 9.7. She will proceed with her next aranesp injection today.   The plan is to continue the capecitabine for a total of 6 cycles before repeating a liver MRI. She will have a brain MRI of August 11 and will review the results with Vicki Marshall. She will follow up with [place] every 2 weeks before the beginning of a new cycle. She understands and agrees with this plan. She knows the goal of treatment in her case is control. She has been encouraged to call with any issues that might arise before her next visit here.   Vicki Panda, NP

## 2015-01-05 ENCOUNTER — Ambulatory Visit (HOSPITAL_BASED_OUTPATIENT_CLINIC_OR_DEPARTMENT_OTHER): Payer: Medicare Other | Admitting: Nurse Practitioner

## 2015-01-05 ENCOUNTER — Other Ambulatory Visit (HOSPITAL_BASED_OUTPATIENT_CLINIC_OR_DEPARTMENT_OTHER): Payer: Medicare Other

## 2015-01-05 ENCOUNTER — Ambulatory Visit (HOSPITAL_BASED_OUTPATIENT_CLINIC_OR_DEPARTMENT_OTHER): Payer: Medicare Other

## 2015-01-05 ENCOUNTER — Encounter: Payer: Self-pay | Admitting: Nurse Practitioner

## 2015-01-05 ENCOUNTER — Ambulatory Visit: Payer: Medicare Other

## 2015-01-05 VITALS — BP 138/76 | HR 94 | Temp 97.7°F | Resp 18 | Ht 65.0 in | Wt 111.8 lb

## 2015-01-05 DIAGNOSIS — C7951 Secondary malignant neoplasm of bone: Secondary | ICD-10-CM | POA: Diagnosis not present

## 2015-01-05 DIAGNOSIS — N1330 Unspecified hydronephrosis: Secondary | ICD-10-CM

## 2015-01-05 DIAGNOSIS — C787 Secondary malignant neoplasm of liver and intrahepatic bile duct: Secondary | ICD-10-CM

## 2015-01-05 DIAGNOSIS — Z170 Estrogen receptor positive status [ER+]: Secondary | ICD-10-CM | POA: Diagnosis not present

## 2015-01-05 DIAGNOSIS — C7931 Secondary malignant neoplasm of brain: Secondary | ICD-10-CM | POA: Diagnosis not present

## 2015-01-05 DIAGNOSIS — N189 Chronic kidney disease, unspecified: Secondary | ICD-10-CM | POA: Diagnosis not present

## 2015-01-05 DIAGNOSIS — C50412 Malignant neoplasm of upper-outer quadrant of left female breast: Secondary | ICD-10-CM

## 2015-01-05 DIAGNOSIS — C7800 Secondary malignant neoplasm of unspecified lung: Secondary | ICD-10-CM | POA: Diagnosis not present

## 2015-01-05 DIAGNOSIS — R17 Unspecified jaundice: Secondary | ICD-10-CM | POA: Insufficient documentation

## 2015-01-05 DIAGNOSIS — D631 Anemia in chronic kidney disease: Secondary | ICD-10-CM | POA: Diagnosis not present

## 2015-01-05 DIAGNOSIS — C773 Secondary and unspecified malignant neoplasm of axilla and upper limb lymph nodes: Secondary | ICD-10-CM | POA: Diagnosis not present

## 2015-01-05 DIAGNOSIS — C50919 Malignant neoplasm of unspecified site of unspecified female breast: Secondary | ICD-10-CM

## 2015-01-05 DIAGNOSIS — C50912 Malignant neoplasm of unspecified site of left female breast: Secondary | ICD-10-CM

## 2015-01-05 LAB — COMPREHENSIVE METABOLIC PANEL (CC13)
ALT: 51 U/L (ref 0–55)
AST: 134 U/L — AB (ref 5–34)
Albumin: 2.8 g/dL — ABNORMAL LOW (ref 3.5–5.0)
Alkaline Phosphatase: 513 U/L — ABNORMAL HIGH (ref 40–150)
Anion Gap: 7 mEq/L (ref 3–11)
BILIRUBIN TOTAL: 2.08 mg/dL — AB (ref 0.20–1.20)
BUN: 21.9 mg/dL (ref 7.0–26.0)
CALCIUM: 8.9 mg/dL (ref 8.4–10.4)
CHLORIDE: 106 meq/L (ref 98–109)
CO2: 25 mEq/L (ref 22–29)
Creatinine: 0.9 mg/dL (ref 0.6–1.1)
EGFR: 67 mL/min/{1.73_m2} — ABNORMAL LOW (ref >90)
GLUCOSE: 142 mg/dL — AB (ref 70–140)
POTASSIUM: 5 meq/L (ref 3.5–5.1)
Sodium: 137 mEq/L (ref 136–145)
Total Protein: 6.4 g/dL (ref 6.4–8.3)

## 2015-01-05 LAB — CBC WITH DIFFERENTIAL/PLATELET
BASO%: 1.1 % (ref 0.0–2.0)
BASOS ABS: 0.1 10*3/uL (ref 0.0–0.1)
EOS%: 1.4 % (ref 0.0–7.0)
Eosinophils Absolute: 0.1 10*3/uL (ref 0.0–0.5)
HEMATOCRIT: 29.6 % — AB (ref 34.8–46.6)
HGB: 9.4 g/dL — ABNORMAL LOW (ref 11.6–15.9)
LYMPH#: 1 10*3/uL (ref 0.9–3.3)
LYMPH%: 22.7 % (ref 14.0–49.7)
MCH: 33.6 pg (ref 25.1–34.0)
MCHC: 31.8 g/dL (ref 31.5–36.0)
MCV: 105.7 fL — AB (ref 79.5–101.0)
MONO#: 0.7 10*3/uL (ref 0.1–0.9)
MONO%: 16.2 % — ABNORMAL HIGH (ref 0.0–14.0)
NEUT%: 58.6 % (ref 38.4–76.8)
NEUTROS ABS: 2.6 10*3/uL (ref 1.5–6.5)
Platelets: 122 10*3/uL — ABNORMAL LOW (ref 145–400)
RBC: 2.8 10*6/uL — ABNORMAL LOW (ref 3.70–5.45)
RDW: 30.3 % — ABNORMAL HIGH (ref 11.2–14.5)
WBC: 4.4 10*3/uL (ref 3.9–10.3)
nRBC: 1 % — ABNORMAL HIGH (ref 0–0)

## 2015-01-05 LAB — TECHNOLOGIST REVIEW

## 2015-01-05 MED ORDER — DENOSUMAB 120 MG/1.7ML ~~LOC~~ SOLN
120.0000 mg | Freq: Once | SUBCUTANEOUS | Status: AC
  Administered 2015-01-05: 120 mg via SUBCUTANEOUS
  Filled 2015-01-05: qty 1.7

## 2015-01-05 MED ORDER — DARBEPOETIN ALFA 500 MCG/ML IJ SOSY
500.0000 ug | PREFILLED_SYRINGE | Freq: Once | INTRAMUSCULAR | Status: AC
  Administered 2015-01-05: 500 ug via SUBCUTANEOUS
  Filled 2015-01-05: qty 1

## 2015-01-05 NOTE — Progress Notes (Signed)
[place]  [phone] [phone]     ID: Vicki Marshall OB: Feb 21, 1946  MR#: [id number]  JYN#:[id number]  PCP: Lilian Coma, MD GYN:   SU: Excell Seltzer OTHER MD: Arloa Koh, Altamese Cabal, Scott Minor DDS, Tyler Pita, Ottis Stain  CHIEF COMPLAINT: stage IV breast cancer, estrogen receptor positive  TREATMENT: capecitabine, aranesp, and denosumab  BREAST CANCER HISTORY: From the original intake note 11/04/2013:  Vicki Marshall noted a change in her left breast approximately mid 2014. This was a flattening of the lateral aspect of the left breast. Eventually she noted an indentation and eventually a "sore" in that area. She had not shared this information with her husband, and she did not have a primary care physician. More recently, as she started to have symptoms related to this, she mentioned it to her husband Vicki Marshall and she establish yourself with Dr. Stephanie Acre, who set her up for bilateral diagnostic mammography and ultrasonography at the [facility] 10/23/2013.. This showed a breast density to be category C. The right breast was negative.  The left breast contained an ulcerated mass laterally measuring at least 4 cm mammography. On physical exam there was an ulcerated mass in the 2:00 location of the left breast, associated with erythematous skin nodules in the upper inner quadrant. The left breast was smaller than the right, and firm. There were palpable left axillary lymph nodes. In addition, on the right upper chest wall, infraclavicular like, there was a firm nodule separate from the bony structures. There was also a right preauricular node and a firm left submandibular node.  Ultrasound showed multiple nodules throughout the lower portion of the left breast. There was a large mass in the upper outer quadrant of the left breast contiguous with the skin ulceration, measuring greater than 5 cm. The left axilla  showed at least 3 suspicious masses, the largest measuring 1.9 cm. Ultrasound of the palpable abnormality of the right chest wall showed a superficial irregular mass, subdermal, measuring 0.8 cm.  Biopsies of the left breast mass, a left axillary lymph node, and the right infra-clavicular mass on 10/23/2013, all showed ([id number]) and invasive ductal carcinoma, grade 1 or 2, estrogen receptor 100% positive, progesterone receptor 23% positive, both with strong staining intensity, with an MIB-1 of 61%, and no HER-2 amplification, the signals ratio being 0.90, and the number per cell 1.80.  On 11/02/2013 the patient underwent bilateral breast MRI and this showed, in the left breast, a 5.8 cm mass eroding through the overlying skin, and also invading the underlying pectoralis muscle and chest wall. There were multiple enhancing nodules throughout the left breast and an adjacent 4.8 cm irregular enhancing mass inferiorly. There are multiple large irregular left axillary lymph nodes, the largest measuring 2.4 cm. There was a 0.9 cm left subpectoral lymph node as well as prevascular and precarinal lymph nodes.  In the right breast far upper inner quadrant there was a 0.8 cm irregular enhancing mass abutting the overlying skin. This is the mass that was biopsied and shown to be positive. In addition there was patchy heterogeneous enhancement of the sternum.  The patient's subsequent history is as detailed below  INTERVAL HISTORY: Vicki Marshall returns today for follow-up of her metastatic breast cancer, accompanied by her husband, Vicki Marshall.  Today is day 12, cycle 4 of capecitabine 1.31m taken BID 7 days on, 7 days off. She continues to tolerate treatment well. She denies mouth sores, palmar/plantar rashes or peeling, and diarrhea. Fatigue continues  to be her main complaint.  REVIEW OF SYSTEMS: Vicki Marshall denies fevers, chills, nausea, or vomiting. Her appetite is no better, but she does feel like she has been able to  eat more. She has no shortness of breath, chest pain, cough, or palpitations. She has some trace edema to her bilateral ankles at the end of the day, but this resolves with elevation. She denies headaches, dizziness, weakness, or vision changes. A detailed review of systems is otherwise stable.  PAST MEDICAL HISTORY: Past Medical History  Diagnosis Date  . Urinary tract infection   . Anemia   . Breast cancer     T3N2M1 stage IV invasive ductal carcinoma   . Brain metastases   . Hydronephrosis, left   . Lung metastases   . Bone metastases     PAST SURGICAL HISTORY: Past Surgical History  Procedure Laterality Date  . Cataract extraction  2013  . Mouth surgery  1981    FAMILY HISTORY: The patient's father died at the age of 47, from pneumonia. The patient's mother lived to be 73. The patient had no brothers, one sister. There is no history of breast or ovarian cancer in the family  GYNECOLOGIC HISTORY:  Menarche age 63. The patient is GX P0. She stopped having periods approximately 2003. She did not take hormone replacement. She used birth control pills for approximately 8 years remotely, with no complications   SOCIAL HISTORY:  Vicki Marshall retired about a year ago. 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 Her husband Vicki Marshall is retired from working in Char[employer] fundraiser. They live alone, with no pets.    ADVANCED DIRECTIVES: Not in place   HEALTH MAINTENANCE: History  Substance Use Topics  . Smoking status: Never Smoker   . Smokeless tobacco: Never Used  . Alcohol Use: Yes     Comment: 10 12 glasses/week     Colonoscopy: Never  PAP: 1990  Bone density: Never  Lipid panel:  No Known Allergies  Current Outpatient Prescriptions  Medication Sig Dispense Refill  . calcium carbonate (TUMS - DOSED IN MG ELEMENTAL CALCIUM) 500 MG chewable tablet Chew 1 tablet by mouth daily.    . capecitabine (XELODA) 500 MG tablet Take 3 tablets (1,500 mg total) by mouth 2  (two) times daily after a meal. 84 tablet 6  . darbepoetin (ARANESP) 100 MCG/0.5ML SOLN injection Inject 500 mcg into the skin.     Marland Kitchen denosumab (XGEVA) 120 MG/1.7ML SOLN injection Inject 120 mg into the skin every 28 (twenty-eight) days.    . ALPRAZolam (XANAX) 0.5 MG tablet Take 1 tablet (0.5 mg total) by mouth at bedtime as needed for anxiety. (Patient not taking: Reported on 08/09/2014) 10 tablet 0  . loratadine (CLARITIN) 10 MG tablet Take 1 tablet (10 mg total) by mouth daily. (Patient not taking: Reported on 11/24/2014) 100 tablet 4   No current facility-administered medications for this visit.    OBJECTIVE: Middle-aged white woman who appears stated age 20 Vitals:   01/05/15 1401  BP: 138/76  Pulse: 94  Temp: 97.7 F (36.5 C)  Resp: 18     Body mass index is 18.6 kg/(m^2).    ECOG FS:1 - Symptomatic but completely ambulatory   Skin: warm, dry HEENT: sclerae anicteric, conjunctivae pink, oropharynx clear. No thrush or mucositis.  Lymph Nodes: No cervical or supraclavicular lymphadenopathy  Lungs: clear to auscultation bilaterally, no rales, wheezes, or rhonci  Heart: regular rate and rhythm  Abdomen: round, soft, non tender, positive  bowel sounds  Musculoskeletal: No focal spinal tenderness, no peripheral edema  Neuro: non focal, well oriented, positive affect  Breasts: deferred   LAB RESULTS:  CMP     Component Value Date/Time   NA 137 01/05/2015 1339   NA 134* 09/15/2014 1449   K 5.0 01/05/2015 1339   K 4.0 09/15/2014 1449   CL 101 09/15/2014 1449   CO2 25 01/05/2015 1339   CO2 22 09/15/2014 1449   GLUCOSE 142* 01/05/2015 1339   GLUCOSE 130* 09/15/2014 1449   BUN 21.9 01/05/2015 1339   BUN 17 09/15/2014 1449   CREATININE 0.9 01/05/2015 1339   CREATININE 0.93 09/15/2014 1449   CALCIUM 8.9 01/05/2015 1339   CALCIUM 8.9 09/15/2014 1449   PROT 6.4 01/05/2015 1339   PROT 6.6 09/15/2014 1449   ALBUMIN 2.8* 01/05/2015 1339   ALBUMIN 3.4* 09/15/2014 1449   AST  134* 01/05/2015 1339   AST 83* 09/15/2014 1449   ALT 51 01/05/2015 1339   ALT 41* 09/15/2014 1449   ALKPHOS 513* 01/05/2015 1339   ALKPHOS 238* 09/15/2014 1449   BILITOT 2.08* 01/05/2015 1339   BILITOT 0.6 09/15/2014 1449    I No results found for: SPEP  Lab Results  Component Value Date   WBC 4.4 01/05/2015   NEUTROABS 2.6 01/05/2015   HGB 9.4* 01/05/2015   HCT 29.6* 01/05/2015   MCV 105.7* 01/05/2015   PLT 122* 01/05/2015      Chemistry      Component Value Date/Time   NA 137 01/05/2015 1339   NA 134* 09/15/2014 1449   K 5.0 01/05/2015 1339   K 4.0 09/15/2014 1449   CL 101 09/15/2014 1449   CO2 25 01/05/2015 1339   CO2 22 09/15/2014 1449   BUN 21.9 01/05/2015 1339   BUN 17 09/15/2014 1449   CREATININE 0.9 01/05/2015 1339   CREATININE 0.93 09/15/2014 1449      Component Value Date/Time   CALCIUM 8.9 01/05/2015 1339   CALCIUM 8.9 09/15/2014 1449   ALKPHOS 513* 01/05/2015 1339   ALKPHOS 238* 09/15/2014 1449   AST 134* 01/05/2015 1339   AST 83* 09/15/2014 1449   ALT 51 01/05/2015 1339   ALT 41* 09/15/2014 1449   BILITOT 2.08* 01/05/2015 1339   BILITOT 0.6 09/15/2014 1449       No results found for: LABCA2  No components found for: YBOFB510  No results for input(s): INR in the last 168 hours.  Urinalysis No results found for: COLORURINE  STUDIES: No results found.   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  (1) staging studies 11/05/2013 showed metastatic involvement of bone, liver, lungs, and brain  (2) antiestrogen therapy with letrozole and fulvestrant as well as targeted therapy with palbociclib started 11/11/2013, discontinued January 2015  with progression  (3) denosumab monthly started 11/11/2013  (4) s/p stereotactic radiosurgery to brain metastases 03/19/2014 - Left lateral cerebellar 4 mm to 20 Gy.   - Left medial cerebellar 3 mm to 20 Gy  (5) Right hydronephrosis w/o obstruction-- followed by urology ([facility])--resolved as of abd MRI 06/02/2014  (6) exemestane and everolimus started 06/07/2014, discontinued 11/10/2014 with progression  (7) s/p further stereotactic radiosurgery 07/05/2014  R parietal 3 mm target: 20  Gy  R parietal 2 mm target: 20 Gy  (8) anemia of CKD;  started darbepoietin 08/04/2014; dose increased 09/15/2014 and again 11/10/2014; ferritin checked regularly  (9) capecitabine 1.5 g BID 7 days on/ 7days off started 11/13/2014  PLAN: Inza is doing well today. She will start cycle 5 this Saturday. The labs were reviewed in detail, and her hgb is down to 9.4. She will proceed with her next aranesp injection today as well as the xgeva which is also due today.  Her bilirubin has steadily trended upwards for the past 4 weeks. It is up to 2.08 today. I consulted with Dr. Jana Hakim and he suggests moving the liver MRI up to after this cycle instead of waiting for the completion of the 6th. She of course is already scheduled for a brain MRI of August 11 and will review the results with Dr. Tammi Klippel. She will follow up with [place] every 2 weeks at her regularly scheduled appointment to review the results of the liver MRI. She understands and agrees with this plan. She knows the goal of treatment in her case is control. She has been encouraged to call with any issues that might arise before her next visit here.   Laurie Panda, NP

## 2015-01-10 ENCOUNTER — Other Ambulatory Visit: Payer: Self-pay | Admitting: Nurse Practitioner

## 2015-01-10 ENCOUNTER — Ambulatory Visit (HOSPITAL_COMMUNITY)
Admission: RE | Admit: 2015-01-10 | Discharge: 2015-01-10 | Disposition: A | Discharge status: Home / Self Care | Payer: Medicare Other | Source: Ambulatory Visit | Attending: Nurse Practitioner | Admitting: Nurse Practitioner

## 2015-01-10 DIAGNOSIS — N1330 Unspecified hydronephrosis: Secondary | ICD-10-CM | POA: Diagnosis not present

## 2015-01-10 DIAGNOSIS — C787 Secondary malignant neoplasm of liver and intrahepatic bile duct: Secondary | ICD-10-CM | POA: Insufficient documentation

## 2015-01-10 DIAGNOSIS — R911 Solitary pulmonary nodule: Secondary | ICD-10-CM | POA: Diagnosis not present

## 2015-01-10 DIAGNOSIS — C50412 Malignant neoplasm of upper-outer quadrant of left female breast: Secondary | ICD-10-CM | POA: Diagnosis not present

## 2015-01-10 DIAGNOSIS — R188 Other ascites: Secondary | ICD-10-CM | POA: Insufficient documentation

## 2015-01-10 MED ORDER — GADOBENATE DIMEGLUMINE 529 MG/ML IV SOLN
10.0000 mL | Freq: Once | INTRAVENOUS | Status: AC | PRN
  Administered 2015-01-10: 10 mL via INTRAVENOUS

## 2015-01-13 ENCOUNTER — Ambulatory Visit
Admission: RE | Admit: 2015-01-13 | Discharge: 2015-01-13 | Disposition: A | Discharge status: Home / Self Care | Payer: Medicare Other | Source: Ambulatory Visit | Attending: Radiation Oncology | Admitting: Radiation Oncology

## 2015-01-13 DIAGNOSIS — C7931 Secondary malignant neoplasm of brain: Secondary | ICD-10-CM

## 2015-01-13 DIAGNOSIS — C7949 Secondary malignant neoplasm of other parts of nervous system: Principal | ICD-10-CM

## 2015-01-13 MED ORDER — GADOBENATE DIMEGLUMINE 529 MG/ML IV SOLN
10.0000 mL | Freq: Once | INTRAVENOUS | Status: AC | PRN
  Administered 2015-01-13: 10 mL via INTRAVENOUS

## 2015-01-17 ENCOUNTER — Ambulatory Visit
Admission: RE | Admit: 2015-01-17 | Discharge: 2015-01-17 | Disposition: A | Discharge status: Home / Self Care | Payer: Medicare Other | Source: Ambulatory Visit | Attending: Radiation Oncology | Admitting: Radiation Oncology

## 2015-01-17 ENCOUNTER — Encounter: Payer: Self-pay | Admitting: Radiation Oncology

## 2015-01-17 VITALS — BP 120/67 | HR 95 | Resp 16 | Wt 113.7 lb

## 2015-01-17 DIAGNOSIS — C7931 Secondary malignant neoplasm of brain: Secondary | ICD-10-CM

## 2015-01-17 DIAGNOSIS — C50412 Malignant neoplasm of upper-outer quadrant of left female breast: Secondary | ICD-10-CM

## 2015-01-17 DIAGNOSIS — C50919 Malignant neoplasm of unspecified site of unspecified female breast: Secondary | ICD-10-CM

## 2015-01-17 NOTE — Progress Notes (Signed)
  Radiation Oncology         [phone]   Name: Vicki Marshall   Date: 01/17/2015   MRN: [id number]  DOB: [birth year]    [facility] Follow-Up Visit Note  CC: Lilian Coma, MD  Magrinat, Virgie Dad, MD    ICD-9-CM ICD-10-CM   1. Breast cancer of upper-outer quadrant of left female breast 174.4 C50.412   2. Breast cancer metastasized to brain, unspecified laterality 174.9 C50.919    198.3 C79.31     Diagnosis:   69 yo woman with brain metastases from ER positive HER-2 negative invasive ductal carcinoma of the upper outer quadrant of the left breast - stage IV s/p SRS on  03/19/2014 - Left lateral cerebellar 4 mm to 20 Gy.   - Left medial cerebellar 3 mm to 20 Gy 07/05/2014   - Right parietal 3 mm to 20 Gy.    - Right parietal 2 mm to 20 Gy  Interval Since Last Radiation:  6 months  Narrative:  The patient returns today for routine follow-up. Weight and vitals stable. Denies pain. Reports intermittent low back pain for which she takes OTC aleve. Denies headache, dizziness, nausea, vomiting, diplopia and ringing in the ears. Taking Xeloda as directed. Denies diarrhea. Reports low energy level but, relates this to chronic anemia.   ALLERGIES:  has No Known Allergies.  Meds: Current Outpatient Prescriptions  Medication Sig Dispense Refill  . calcium carbonate (TUMS - DOSED IN MG ELEMENTAL CALCIUM) 500 MG chewable tablet Chew 1 tablet by mouth daily.    . capecitabine (XELODA) 500 MG tablet Take 3 tablets (1,500 mg total) by mouth 2 (two) times daily after a meal. 84 tablet 6  . darbepoetin (ARANESP) 100 MCG/0.5ML SOLN injection Inject 500 mcg into the skin.     Marland Kitchen denosumab (XGEVA) 120 MG/1.7ML SOLN injection Inject 120 mg into the skin every 28 (twenty-eight) days.    . ALPRAZolam (XANAX) 0.5 MG tablet Take 1 tablet (0.5 mg total) by mouth at bedtime as needed for anxiety. (Patient not taking: Reported on 08/09/2014) 10 tablet 0   No  current facility-administered medications for this encounter.    Physical Findings: The patient is in no acute distress. Patient is alert and oriented.  weight is 113 lb 11.2 oz (51.574 kg). Her blood pressure is 120/67 and her pulse is 95. Her respiration is 16 and oxygen saturation is 100%. .  No significant changes.    Lab Findings: Lab Results  Component Value Date   WBC 4.4 01/05/2015   HGB 9.4* 01/05/2015   HCT 29.6* 01/05/2015   MCV 105.7* 01/05/2015   PLT 122* 01/05/2015    Impression:  The patient has no evidence of intracranial active disease..    Plan: Repeat MRI and follow up in 3 months.  _____________________________________  Sheral Apley. Tammi Klippel, M.D.

## 2015-01-17 NOTE — Progress Notes (Signed)
Weight and vitals stable. Denies pain. Reports intermittent low back pain for which she takes OTC aleve. Denies headache, dizziness, nausea, vomiting, diplopia and ringing in the ears. Taking Xeloda as directed. Denies diarrhea. Reports low energy level but, relates this to chronic anemia.  BP 120/67 mmHg  Pulse 95  Resp 16  Wt 113 lb 11.2 oz (51.574 kg)  SpO2 100% Wt Readings from Last 3 Encounters:  01/17/15 113 lb 11.2 oz (51.574 kg)  01/10/15 112 lb (50.803 kg)  01/05/15 111 lb 12.8 oz (50.712 kg)

## 2015-01-19 ENCOUNTER — Ambulatory Visit (HOSPITAL_BASED_OUTPATIENT_CLINIC_OR_DEPARTMENT_OTHER): Payer: Medicare Other

## 2015-01-19 ENCOUNTER — Ambulatory Visit (HOSPITAL_BASED_OUTPATIENT_CLINIC_OR_DEPARTMENT_OTHER): Payer: Medicare Other | Admitting: Nurse Practitioner

## 2015-01-19 ENCOUNTER — Other Ambulatory Visit (HOSPITAL_BASED_OUTPATIENT_CLINIC_OR_DEPARTMENT_OTHER): Payer: Medicare Other

## 2015-01-19 VITALS — BP 133/69 | HR 84 | Temp 98.0°F | Resp 18 | Wt 113.8 lb

## 2015-01-19 DIAGNOSIS — D631 Anemia in chronic kidney disease: Secondary | ICD-10-CM | POA: Diagnosis not present

## 2015-01-19 DIAGNOSIS — C787 Secondary malignant neoplasm of liver and intrahepatic bile duct: Secondary | ICD-10-CM | POA: Diagnosis not present

## 2015-01-19 DIAGNOSIS — N1330 Unspecified hydronephrosis: Secondary | ICD-10-CM

## 2015-01-19 DIAGNOSIS — C7800 Secondary malignant neoplasm of unspecified lung: Secondary | ICD-10-CM | POA: Diagnosis not present

## 2015-01-19 DIAGNOSIS — C50919 Malignant neoplasm of unspecified site of unspecified female breast: Secondary | ICD-10-CM

## 2015-01-19 DIAGNOSIS — C7931 Secondary malignant neoplasm of brain: Secondary | ICD-10-CM | POA: Diagnosis not present

## 2015-01-19 DIAGNOSIS — C7951 Secondary malignant neoplasm of bone: Secondary | ICD-10-CM | POA: Diagnosis not present

## 2015-01-19 DIAGNOSIS — C50412 Malignant neoplasm of upper-outer quadrant of left female breast: Secondary | ICD-10-CM | POA: Diagnosis present

## 2015-01-19 DIAGNOSIS — R17 Unspecified jaundice: Secondary | ICD-10-CM

## 2015-01-19 DIAGNOSIS — C773 Secondary and unspecified malignant neoplasm of axilla and upper limb lymph nodes: Secondary | ICD-10-CM | POA: Diagnosis not present

## 2015-01-19 DIAGNOSIS — N189 Chronic kidney disease, unspecified: Secondary | ICD-10-CM | POA: Diagnosis present

## 2015-01-19 DIAGNOSIS — C50912 Malignant neoplasm of unspecified site of left female breast: Secondary | ICD-10-CM

## 2015-01-19 DIAGNOSIS — D630 Anemia in neoplastic disease: Secondary | ICD-10-CM

## 2015-01-19 LAB — COMPREHENSIVE METABOLIC PANEL (CC13)
ALT: 50 U/L (ref 0–55)
AST: 133 U/L — ABNORMAL HIGH (ref 5–34)
Albumin: 2.8 g/dL — ABNORMAL LOW (ref 3.5–5.0)
Alkaline Phosphatase: 475 U/L — ABNORMAL HIGH (ref 40–150)
Anion Gap: 8 mEq/L (ref 3–11)
BILIRUBIN TOTAL: 2.11 mg/dL — AB (ref 0.20–1.20)
BUN: 23.8 mg/dL (ref 7.0–26.0)
CHLORIDE: 107 meq/L (ref 98–109)
CO2: 23 meq/L (ref 22–29)
Calcium: 8.2 mg/dL — ABNORMAL LOW (ref 8.4–10.4)
Creatinine: 0.9 mg/dL (ref 0.6–1.1)
EGFR: 66 mL/min/{1.73_m2} — AB (ref >90)
Glucose: 111 mg/dl (ref 70–140)
POTASSIUM: 4.8 meq/L (ref 3.5–5.1)
SODIUM: 138 meq/L (ref 136–145)
TOTAL PROTEIN: 6.1 g/dL — AB (ref 6.4–8.3)

## 2015-01-19 LAB — CBC & DIFF AND RETIC
BASO%: 1.3 % (ref 0.0–2.0)
Basophils Absolute: 0.1 10*3/uL (ref 0.0–0.1)
EOS%: 1.8 % (ref 0.0–7.0)
Eosinophils Absolute: 0.1 10*3/uL (ref 0.0–0.5)
HCT: 28.2 % — ABNORMAL LOW (ref 34.8–46.6)
HGB: 9.1 g/dL — ABNORMAL LOW (ref 11.6–15.9)
IMMATURE RETIC FRACT: 22.4 % — AB (ref 1.60–10.00)
LYMPH#: 1 10*3/uL (ref 0.9–3.3)
LYMPH%: 25 % (ref 14.0–49.7)
MCH: 35.4 pg — ABNORMAL HIGH (ref 25.1–34.0)
MCHC: 32.3 g/dL (ref 31.5–36.0)
MCV: 109.7 fL — AB (ref 79.5–101.0)
MONO#: 0.9 10*3/uL (ref 0.1–0.9)
MONO%: 22.5 % — ABNORMAL HIGH (ref 0.0–14.0)
NEUT%: 49.4 % (ref 38.4–76.8)
NEUTROS ABS: 2 10*3/uL (ref 1.5–6.5)
Platelets: 119 10*3/uL — ABNORMAL LOW (ref 145–400)
RBC: 2.57 10*6/uL — ABNORMAL LOW (ref 3.70–5.45)
RDW: 30.1 % — ABNORMAL HIGH (ref 11.2–14.5)
Retic %: 4.93 % — ABNORMAL HIGH (ref 0.70–2.10)
Retic Ct Abs: 126.7 10*3/uL — ABNORMAL HIGH (ref 33.70–90.70)
WBC: 4 10*3/uL (ref 3.9–10.3)

## 2015-01-19 LAB — TECHNOLOGIST REVIEW

## 2015-01-19 LAB — FERRITIN CHCC

## 2015-01-19 MED ORDER — DARBEPOETIN ALFA 500 MCG/ML IJ SOSY
500.0000 ug | PREFILLED_SYRINGE | Freq: Once | INTRAMUSCULAR | Status: AC
  Administered 2015-01-19: 500 ug via SUBCUTANEOUS
  Filled 2015-01-19: qty 1

## 2015-01-19 NOTE — Progress Notes (Signed)
[place]  [phone] [phone]) [phone]     ID: [name] OB: 19-Jan-1946  MR#: [id number]  UQJ#:[id number]  PCP: Lilian Coma, MD GYN:   SU: Excell Seltzer OTHER MD: Arloa Koh, Altamese Cabal, Scott Minor DDS, Tyler Pita, Ottis Stain  CHIEF COMPLAINT: stage IV breast cancer, estrogen receptor positive  TREATMENT: capecitabine, aranesp, and denosumab  BREAST CANCER HISTORY: From the original intake note 11/04/2013:  Edie noted a change in her left breast approximately mid 2014. This was a flattening of the lateral aspect of the left breast. Eventually she noted an indentation and eventually a "sore" in that area. She had not shared this information with her husband, and she did not have a primary care physician. More recently, as she started to have symptoms related to this, she mentioned it to her husband [name] and she establish yourself with Dr. Stephanie Acre, who set her up for bilateral diagnostic mammography and ultrasonography at the [facility] 10/23/2013.. This showed a breast density to be category C. The right breast was negative.  The left breast contained an ulcerated mass laterally measuring at least 4 cm mammography. On physical exam there was an ulcerated mass in the 2:00 location of the left breast, associated with erythematous skin nodules in the upper inner quadrant. The left breast was smaller than the right, and firm. There were palpable left axillary lymph nodes. In addition, on the right upper chest wall, infraclavicular like, there was a firm nodule separate from the bony structures. There was also a right preauricular node and a firm left submandibular node.  Ultrasound showed multiple nodules throughout the lower portion of the left breast. There was a large mass in the upper outer quadrant of the left breast contiguous with the skin ulceration, measuring greater than 5 cm. The left axilla  showed at least 3 suspicious masses, the largest measuring 1.9 cm. Ultrasound of the palpable abnormality of the right chest wall showed a superficial irregular mass, subdermal, measuring 0.8 cm.  Biopsies of the left breast mass, a left axillary lymph node, and the right infra-clavicular mass on 10/23/2013, all showed ([id number]) and invasive ductal carcinoma, grade 1 or 2, estrogen receptor 100% positive, progesterone receptor 23% positive, both with strong staining intensity, with an MIB-1 of 61%, and no HER-2 amplification, the signals ratio being 0.90, and the number per cell 1.80.  On 11/02/2013 the patient underwent bilateral breast MRI and this showed, in the left breast, a 5.8 cm mass eroding through the overlying skin, and also invading the underlying pectoralis muscle and chest wall. There were multiple enhancing nodules throughout the left breast and an adjacent 4.8 cm irregular enhancing mass inferiorly. There are multiple large irregular left axillary lymph nodes, the largest measuring 2.4 cm. There was a 0.9 cm left subpectoral lymph node as well as prevascular and precarinal lymph nodes.  In the right breast far upper inner quadrant there was a 0.8 cm irregular enhancing mass abutting the overlying skin. This is the mass that was biopsied and shown to be positive. In addition there was patchy heterogeneous enhancement of the sternum.  The patient's subsequent history is as detailed below  INTERVAL HISTORY: Lailynn returns today for follow-up of her metastatic breast cancer, accompanied by her husband, [name].  Today is day 12, cycle 5 of capecitabine 1.75m taken BID 7 days on, 7 days off. She continues to tolerate treatment well. She denies mouth sores, palmar/plantar rashes or peeling, and diarrhea. Fatigue continues  to be her main complaint. She is here to review the results of her liver MRI. This was originally scheduled for after cycle 6, but as her bilirubin has been trending  upwards, we moved the scan up to this week. She denies abdominal pain, bloating, or fullness.  REVIEW OF SYSTEMS: Lani denies fevers, chills, nausea, or vomiting. She has been moving her bowels well. Her appetite is decent. She some shortness of breath with exertion, but denies chest pain, cough, or palpitations. She has some trace edema to her bilateral ankles at the end of the day, but this resolves with elevation. She denies headaches, dizziness, weakness, or vision changes. She takes aleve for some mild back pain, but this is no worse than it has been. A detailed review of systems is otherwise stable.  PAST MEDICAL HISTORY: Past Medical History  Diagnosis Date  . Urinary tract infection   . Anemia   . Breast cancer     T3N2M1 stage IV invasive ductal carcinoma   . Brain metastases   . Hydronephrosis, left   . Lung metastases   . Bone metastases     PAST SURGICAL HISTORY: Past Surgical History  Procedure Laterality Date  . Cataract extraction  2013  . Mouth surgery  1981    FAMILY HISTORY: The patient's father died at the age of 69, from pneumonia. The patient's mother lived to be 48. The patient had no brothers, one sister. There is no history of breast or ovarian cancer in the family  GYNECOLOGIC HISTORY:  Menarche age 69. The patient is GX P0. She stopped having periods approximately 2003. She did not take hormone replacement. She used birth control pills for approximately 8 years remotely, with no complications   SOCIAL HISTORY:  Laronica retired about a year ago. 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 Her husband [name] is retired from working in Charity fundraiser. They live alone, with no pets.    ADVANCED DIRECTIVES: Not in place   HEALTH MAINTENANCE: Social History  Substance Use Topics  . Smoking status: Never Smoker   . Smokeless tobacco: Never Used  . Alcohol Use: Yes     Comment: 10 12 glasses/week     Colonoscopy: Never  PAP:  1990  Bone density: Never  Lipid panel:  No Known Allergies  Current Outpatient Prescriptions  Medication Sig Dispense Refill  . calcium carbonate (TUMS - DOSED IN MG ELEMENTAL CALCIUM) 500 MG chewable tablet Chew 1 tablet by mouth daily.    . capecitabine (XELODA) 500 MG tablet Take 3 tablets (1,500 mg total) by mouth 2 (two) times daily after a meal. 84 tablet 6  . darbepoetin (ARANESP) 100 MCG/0.5ML SOLN injection Inject 500 mcg into the skin.     Marland Kitchen denosumab (XGEVA) 120 MG/1.7ML SOLN injection Inject 120 mg into the skin every 28 (twenty-eight) days.    . ALPRAZolam (XANAX) 0.5 MG tablet Take 1 tablet (0.5 mg total) by mouth at bedtime as needed for anxiety. (Patient not taking: Reported on 08/09/2014) 10 tablet 0   No current facility-administered medications for this visit.    OBJECTIVE: Middle-aged white woman who appears stated age 88 Vitals:   01/19/15 1307  BP: 133/69  Pulse: 84  Temp: 98 F (36.7 C)  Resp: 18     Body mass index is 18.38 kg/(m^2).    ECOG FS:1 - Symptomatic but completely ambulatory  Sclerae unicteric, pupils round and equal Oropharynx clear and moist-- no thrush or other lesions  No cervical or supraclavicular adenopathy Lungs no rales or rhonchi Heart regular rate and rhythm Abd soft, nontender, positive bowel sounds MSK no focal spinal tenderness, no upper extremity lymphedema Neuro: nonfocal, well oriented, appropriate affect Breasts: deferred  LAB RESULTS:  CMP     Component Value Date/Time   NA 138 01/19/2015 1235   NA 134* 09/15/2014 1449   K 4.8 01/19/2015 1235   K 4.0 09/15/2014 1449   CL 101 09/15/2014 1449   CO2 23 01/19/2015 1235   CO2 22 09/15/2014 1449   GLUCOSE 111 01/19/2015 1235   GLUCOSE 130* 09/15/2014 1449   BUN 23.8 01/19/2015 1235   BUN 17 09/15/2014 1449   CREATININE 0.9 01/19/2015 1235   CREATININE 0.93 09/15/2014 1449   CALCIUM 8.2* 01/19/2015 1235   CALCIUM 8.9 09/15/2014 1449   PROT 6.1* 01/19/2015 1235    PROT 6.6 09/15/2014 1449   ALBUMIN 2.8* 01/19/2015 1235   ALBUMIN 3.4* 09/15/2014 1449   AST 133* 01/19/2015 1235   AST 83* 09/15/2014 1449   ALT 50 01/19/2015 1235   ALT 41* 09/15/2014 1449   ALKPHOS 475* 01/19/2015 1235   ALKPHOS 238* 09/15/2014 1449   BILITOT 2.11* 01/19/2015 1235   BILITOT 0.6 09/15/2014 1449    I No results found for: SPEP  Lab Results  Component Value Date   WBC 4.0 01/19/2015   NEUTROABS 2.0 01/19/2015   HGB 9.1* 01/19/2015   HCT 28.2* 01/19/2015   MCV 109.7* 01/19/2015   PLT 119* 01/19/2015      Chemistry      Component Value Date/Time   NA 138 01/19/2015 1235   NA 134* 09/15/2014 1449   K 4.8 01/19/2015 1235   K 4.0 09/15/2014 1449   CL 101 09/15/2014 1449   CO2 23 01/19/2015 1235   CO2 22 09/15/2014 1449   BUN 23.8 01/19/2015 1235   BUN 17 09/15/2014 1449   CREATININE 0.9 01/19/2015 1235   CREATININE 0.93 09/15/2014 1449      Component Value Date/Time   CALCIUM 8.2* 01/19/2015 1235   CALCIUM 8.9 09/15/2014 1449   ALKPHOS 475* 01/19/2015 1235   ALKPHOS 238* 09/15/2014 1449   AST 133* 01/19/2015 1235   AST 83* 09/15/2014 1449   ALT 50 01/19/2015 1235   ALT 41* 09/15/2014 1449   BILITOT 2.11* 01/19/2015 1235   BILITOT 0.6 09/15/2014 1449       No results found for: LABCA2  No components found for: [id number]  No results for input(s): INR in the last 168 hours.  Urinalysis No results found for: COLORURINE  STUDIES: Mr Kizzie Fantasia Contrast  February 04, 2015   CLINICAL DATA:  69 year old female with brain metastases from ER positive HER-2 negative invasive ductal carcinoma of the upper outer quadrant of the left breast - stage IV s/p SRS on  03/19/2014- Left lateral cerebellar 4 mm to 20 Gy. - Left medial cerebellar 3 mm to 20 Gy2/06/2014 - Right parietal 3 mm to 20 Gy. - Right parietal 2 mm to 20 Gy.  Restaging.  Subsequent encounter.  EXAM: MRI HEAD WITHOUT AND WITH CONTRAST  TECHNIQUE: Multiplanar, multiecho pulse sequences of the brain  and surrounding structures were obtained without and with intravenous contrast.  CONTRAST:  12m MULTIHANCE GADOBENATE DIMEGLUMINE 529 MG/ML IV SOLN  COMPARISON:  10/08/2014 and earlier  FINDINGS: Stable residual left cerebellar enhancing metastasis, about 4 mm (series 10, image 39).  As before the other treated cerebellar metastasis is no longer visible.  Stable punctate residual  metastases in the inferior right parietal lobe on series 10, images 105 and 109.  Stable anterior right frontal lobe developmental venous anomaly. No new intracranial mass or enhancement.  No intracranial mass effect. No restricted diffusion to suggest acute infarction. No ventriculomegaly, extra-axial collection or acute intracranial hemorrhage. Cervicomedullary junction and pituitary are within normal limits. Negative visualized cervical spine. Major intracranial vascular flow voids are stable. Stable gray and white matter signal throughout the brain.  Diffusely decreased and heterogeneous bone marrow signal appears stable since 2015. No destructive osseous lesion identified.  Visible internal auditory structures appear normal. Paranasal sinuses and mastoids are clear. Orbits soft tissues appear stable and normal. Negative scalp soft tissues.  IMPRESSION: 1. Three small treated brain metastases remain visible and are stable since May, and stable to regressed since January. 2. No new brain metastasis identify. No new intracranial abnormality. 3. Diffusely abnormal but stable bone marrow signal.   Electronically Signed   By: Genevie Ann M.D.   On: 01/13/2015 11:33   Mr Liver W Wo Contrast  01/10/2015   CLINICAL DATA:  Subsequent encounter for breast cancer with liver metastases.  EXAM: MRI ABDOMEN WITHOUT AND WITH CONTRAST  TECHNIQUE: Multiplanar multisequence MR imaging of the abdomen was performed both before and after the administration of intravenous contrast.  CONTRAST:  73m MULTIHANCE GADOBENATE DIMEGLUMINE 529 MG/ML IV SOLN   COMPARISON:  11/04/2014.  09/06/2014.  FINDINGS: Lower chest: 10 mm left lower lobe pulmonary nodule is stable to minimally larger. See image 4 series 3.  Hepatobiliary: Multiple pulmonary metastases again noted. Index lesion in the central right liver measured 2.7 x 3.7 cm previously compared to 1.7 x 2.6 cm today.  Inferior right hepatic lobe lesion measured previously at 3.5 x 3.4 cm now measures 2.9 x 2.9 cm.  Posteromedial right hepatic lesion measured previously 2.1 x 1.8 cm now measures 2.5 x 1.8 cm.  2.4 cm lesion seen previously in the lateral segment of the left liver measures 1.6 cm today.  Gallbladder is without evidence for stones. Mild gallbladder wall thickening is evident. No intrahepatic or extrahepatic biliary dilation.  Pancreas: No focal mass lesion. No dilatation of the main duct. No intraparenchymal cyst. No peripancreatic edema.  Spleen: No splenomegaly. No focal mass lesion.  Adrenals/Urinary Tract: No adrenal nodule or mass. Right-sided hydronephrosis has progressed in the interval with no evidence for right-sided hydroureter suggesting obstruction is at the level of the UPJ. Left kidney is stable.  Stomach/Bowel: Stomach is nondistended. No gastric wall thickening. No evidence of outlet obstruction. No evidence for small bowel obstruction.  Vascular/Lymphatic: No abdominal aortic aneurysm. The portal vein and superior mesenteric vein are patent.  Other: Slight interval increase in volume of mild perihepatic and perisplenic ascites.  Musculoskeletal: Diffusely abnormal marrow enhancement consistent with widespread bony metastatic involvement.  IMPRESSION: 1. Widespread metastatic disease in the liver parenchyma again noted. No substantial interval change although there does appear to be a slight interval improvement. Three of the for index lesions in the liver measure smaller on today's exam. 2. Stable to slight increase in the left lower lobe pulmonary nodule. 3. Widespread heterogeneous  marrow enhancement consistent with bony metastatic disease. 4. Slight interval increase in perihepatic and perisplenic ascites. Free fluid is also visible in the pelvis. Free fluid may account for the apparent mild gallbladder wall thickening. 5. Interval worsening of right hydronephrosis.   Electronically Signed   By: EMisty StanleyM.D.   On: 01/10/2015 18:03     ASSESSMENT:  69 y.o. [place] woman status post left breast and left axillary lymph node biopsy as well as right infraclavicular chest wall biopsy 10/23/2013 for a clinical T3, N2, M1, stage IV invasive ductal carcinoma, grade 2, estrogen receptor 100% positive, progesterone receptor 23% positive, with an MIB-1 of 61%, and no HER-2 amplification.  (1) staging studies 11/05/2013 showed metastatic involvement of bone, liver, lungs, and brain  (2) antiestrogen therapy with letrozole and fulvestrant as well as targeted therapy with palbociclib started 11/11/2013, discontinued January 2015 with progression  (3) denosumab monthly started 11/11/2013  (4) s/p stereotactic radiosurgery to brain metastases 03/19/2014 - Left lateral cerebellar 4 mm to 20 Gy.   - Left medial cerebellar 3 mm to 20 Gy  (5) Right hydronephrosis w/o obstruction-- followed by urology ([facility])--resolved as of abd MRI 06/02/2014  (6) exemestane and everolimus started 06/07/2014, discontinued 11/10/2014 with progression  (7) s/p further stereotactic radiosurgery 07/05/2014  R parietal 3 mm target: 20 Gy  R parietal 2 mm target: 20 Gy  (8) anemia of CKD;  started darbepoietin 08/04/2014; dose increased 09/15/2014 and again 11/10/2014; ferritin checked regularly  (9) capecitabine 1.5 g BID 7 days on/ 7days off started 11/13/2014  PLAN: Dr. Jana Hakim and I reviewed the scans with the patient. The disease in the liver shows stable to slight improvement. Her bilirubin is still elevated, but per Dr. Jana Hakim she will be able to continue on the capecitabine. She  tolerates this well with no side effects, besides fatigue. She is due to start her next cycle this Saturday.   She had a brain MRI last week that she reviewed with Dr. Tammi Klippel that was also stable. They plan to repeat this in 3 months.   Thai will return in 2 weeks prior to her next cycle of capecitabine. We will repeat a liver MRI in 8 weeks. She understands and agrees with this plan. She knows the goal of treatment in her case is control. She has been encouraged to call with any issues that might arise before her next visit here.   Laurie Panda, NP    ADDENDUM: Jaquelyne is tolerating the capecitabine well and it appears to be controlling her tumor. We are going to continue through an additional 4 cycles (2 months) and then repeat a liver MRI. I believe this will fall right after her brain MRI. That will allow [place] to reassess her total situation.  The point being of course that if there is progression in the brain and the peripheral disease becomes less important.  I personally saw this patient and performed a substantive portion of this encounter with the listed APP documented above.   Chauncey Cruel, MD Medical Oncology and Hematology [facility] [address] [place], [place] [address] Tel. [phone]    Fax. [phone]

## 2015-01-20 ENCOUNTER — Encounter: Payer: Self-pay | Admitting: Nurse Practitioner

## 2015-02-02 ENCOUNTER — Ambulatory Visit (HOSPITAL_BASED_OUTPATIENT_CLINIC_OR_DEPARTMENT_OTHER): Payer: Medicare Other

## 2015-02-02 ENCOUNTER — Ambulatory Visit (HOSPITAL_BASED_OUTPATIENT_CLINIC_OR_DEPARTMENT_OTHER): Payer: Medicare Other | Admitting: Nurse Practitioner

## 2015-02-02 ENCOUNTER — Other Ambulatory Visit (HOSPITAL_BASED_OUTPATIENT_CLINIC_OR_DEPARTMENT_OTHER): Payer: Medicare Other

## 2015-02-02 ENCOUNTER — Encounter: Payer: Self-pay | Admitting: Nurse Practitioner

## 2015-02-02 VITALS — BP 132/71 | HR 95 | Temp 97.8°F | Resp 20 | Ht 66.0 in | Wt 120.7 lb

## 2015-02-02 DIAGNOSIS — C50919 Malignant neoplasm of unspecified site of unspecified female breast: Secondary | ICD-10-CM

## 2015-02-02 DIAGNOSIS — C50412 Malignant neoplasm of upper-outer quadrant of left female breast: Secondary | ICD-10-CM

## 2015-02-02 DIAGNOSIS — N183 Chronic kidney disease, stage 3 (moderate): Secondary | ICD-10-CM | POA: Diagnosis not present

## 2015-02-02 DIAGNOSIS — C7951 Secondary malignant neoplasm of bone: Secondary | ICD-10-CM

## 2015-02-02 DIAGNOSIS — N1330 Unspecified hydronephrosis: Secondary | ICD-10-CM

## 2015-02-02 DIAGNOSIS — R17 Unspecified jaundice: Secondary | ICD-10-CM | POA: Diagnosis not present

## 2015-02-02 DIAGNOSIS — Z5111 Encounter for antineoplastic chemotherapy: Secondary | ICD-10-CM | POA: Diagnosis not present

## 2015-02-02 DIAGNOSIS — C7931 Secondary malignant neoplasm of brain: Secondary | ICD-10-CM

## 2015-02-02 DIAGNOSIS — D631 Anemia in chronic kidney disease: Secondary | ICD-10-CM | POA: Diagnosis not present

## 2015-02-02 DIAGNOSIS — C50912 Malignant neoplasm of unspecified site of left female breast: Secondary | ICD-10-CM

## 2015-02-02 LAB — CBC WITH DIFFERENTIAL/PLATELET
BASO%: 1.5 % (ref 0.0–2.0)
Basophils Absolute: 0.1 10*3/uL (ref 0.0–0.1)
EOS ABS: 0.1 10*3/uL (ref 0.0–0.5)
EOS%: 1.5 % (ref 0.0–7.0)
HCT: 30.1 % — ABNORMAL LOW (ref 34.8–46.6)
HEMOGLOBIN: 9.6 g/dL — AB (ref 11.6–15.9)
LYMPH%: 22.1 % (ref 14.0–49.7)
MCH: 36.2 pg — AB (ref 25.1–34.0)
MCHC: 31.9 g/dL (ref 31.5–36.0)
MCV: 113.6 fL — AB (ref 79.5–101.0)
MONO#: 0.9 10*3/uL (ref 0.1–0.9)
MONO%: 22.9 % — ABNORMAL HIGH (ref 0.0–14.0)
NEUT#: 2.1 10*3/uL (ref 1.5–6.5)
NEUT%: 52 % (ref 38.4–76.8)
PLATELETS: 122 10*3/uL — AB (ref 145–400)
RBC: 2.65 10*6/uL — ABNORMAL LOW (ref 3.70–5.45)
RDW: 28.5 % — AB (ref 11.2–14.5)
WBC: 4 10*3/uL (ref 3.9–10.3)
lymph#: 0.9 10*3/uL (ref 0.9–3.3)

## 2015-02-02 LAB — COMPREHENSIVE METABOLIC PANEL (CC13)
ALK PHOS: 499 U/L — AB (ref 40–150)
ALT: 47 U/L (ref 0–55)
ANION GAP: 7 meq/L (ref 3–11)
AST: 151 U/L — ABNORMAL HIGH (ref 5–34)
Albumin: 2.7 g/dL — ABNORMAL LOW (ref 3.5–5.0)
BILIRUBIN TOTAL: 2.36 mg/dL — AB (ref 0.20–1.20)
BUN: 23.4 mg/dL (ref 7.0–26.0)
CO2: 23 mEq/L (ref 22–29)
Calcium: 8.9 mg/dL (ref 8.4–10.4)
Chloride: 109 mEq/L (ref 98–109)
Creatinine: 0.9 mg/dL (ref 0.6–1.1)
EGFR: 64 mL/min/{1.73_m2} — AB (ref >90)
Glucose: 113 mg/dl (ref 70–140)
POTASSIUM: 4.4 meq/L (ref 3.5–5.1)
Sodium: 140 mEq/L (ref 136–145)
TOTAL PROTEIN: 6.2 g/dL — AB (ref 6.4–8.3)

## 2015-02-02 LAB — TECHNOLOGIST REVIEW

## 2015-02-02 MED ORDER — DENOSUMAB 120 MG/1.7ML ~~LOC~~ SOLN
120.0000 mg | Freq: Once | SUBCUTANEOUS | Status: AC
  Administered 2015-02-02: 120 mg via SUBCUTANEOUS
  Filled 2015-02-02: qty 1.7

## 2015-02-02 MED ORDER — DARBEPOETIN ALFA 500 MCG/ML IJ SOSY
500.0000 ug | PREFILLED_SYRINGE | Freq: Once | INTRAMUSCULAR | Status: AC
  Administered 2015-02-02: 500 ug via SUBCUTANEOUS
  Filled 2015-02-02: qty 1

## 2015-02-02 NOTE — Progress Notes (Signed)
[place]  [phone] [phone]     ID: [name] OB: 01-05-1946  MR#: [id number]  IZT#:[id number]  PCP: Lilian Coma, MD GYN:   SU: Excell Seltzer OTHER MD: Arloa Koh, Altamese Cabal, Scott Minor DDS, Tyler Pita, Ottis Stain  CHIEF COMPLAINT: stage IV breast cancer, estrogen receptor positive  TREATMENT: capecitabine, aranesp, and denosumab  BREAST CANCER HISTORY: From the original intake note 11/04/2013:  Dhanvi noted a change in her left breast approximately mid 2014. This was a flattening of the lateral aspect of the left breast. Eventually she noted an indentation and eventually a "sore" in that area. She had not shared this information with her husband, and she did not have a primary care physician. More recently, as she started to have symptoms related to this, she mentioned it to her husband [name] and she establish yourself with Dr. Stephanie Acre, who set her up for bilateral diagnostic mammography and ultrasonography at the [facility] 10/23/2013.. This showed a breast density to be category C. The right breast was negative.  The left breast contained an ulcerated mass laterally measuring at least 4 cm mammography. On physical exam there was an ulcerated mass in the 2:00 location of the left breast, associated with erythematous skin nodules in the upper inner quadrant. The left breast was smaller than the right, and firm. There were palpable left axillary lymph nodes. In addition, on the right upper chest wall, infraclavicular like, there was a firm nodule separate from the bony structures. There was also a right preauricular node and a firm left submandibular node.  Ultrasound showed multiple nodules throughout the lower portion of the left breast. There was a large mass in the upper outer quadrant of the left breast contiguous with the skin ulceration, measuring greater than 5 cm. The left axilla  showed at least 3 suspicious masses, the largest measuring 1.9 cm. Ultrasound of the palpable abnormality of the right chest wall showed a superficial irregular mass, subdermal, measuring 0.8 cm.  Biopsies of the left breast mass, a left axillary lymph node, and the right infra-clavicular mass on 10/23/2013, all showed ([id number]) and invasive ductal carcinoma, grade 1 or 2, estrogen receptor 100% positive, progesterone receptor 23% positive, both with strong staining intensity, with an MIB-1 of 61%, and no HER-2 amplification, the signals ratio being 0.90, and the number per cell 1.80.  On 11/02/2013 the patient underwent bilateral breast MRI and this showed, in the left breast, a 5.8 cm mass eroding through the overlying skin, and also invading the underlying pectoralis muscle and chest wall. There were multiple enhancing nodules throughout the left breast and an adjacent 4.8 cm irregular enhancing mass inferiorly. There are multiple large irregular left axillary lymph nodes, the largest measuring 2.4 cm. There was a 0.9 cm left subpectoral lymph node as well as prevascular and precarinal lymph nodes.  In the right breast far upper inner quadrant there was a 0.8 cm irregular enhancing mass abutting the overlying skin. This is the mass that was biopsied and shown to be positive. In addition there was patchy heterogeneous enhancement of the sternum.  The patient's subsequent history is as detailed below  INTERVAL HISTORY: Annya returns today for follow-up of her metastatic breast cancer, accompanied by her husband, [name].  Today is day 12, cycle 6 of capecitabine 1.55m taken BID 7 days on, 7 days off. She continues to tolerate treatment well. She denies mouth sores, palmar/plantar rashes or peeling, and diarrhea.  REVIEW  OF SYSTEMS: Josely has no new complaints to offer. She has some fatigue and ankle swelling, bu this is unchanged. A detailed review of systems is otherwise stable.  PAST MEDICAL  HISTORY: Past Medical History  Diagnosis Date  . Urinary tract infection   . Anemia   . Breast cancer     T3N2M1 stage IV invasive ductal carcinoma   . Brain metastases   . Hydronephrosis, left   . Lung metastases   . Bone metastases     PAST SURGICAL HISTORY: Past Surgical History  Procedure Laterality Date  . Cataract extraction  2013  . Mouth surgery  1981    FAMILY HISTORY: The patient's father died at the age of 56, from pneumonia. The patient's mother lived to be 6. The patient had no brothers, one sister. There is no history of breast or ovarian cancer in the family  GYNECOLOGIC HISTORY:  Menarche age 52. The patient is GX P0. She stopped having periods approximately 2003. She did not take hormone replacement. She used birth control pills for approximately 8 years remotely, with no complications   SOCIAL HISTORY:  Alessia retired about a year ago. 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 Her husband [name] is retired from working in Charity fundraiser. They live alone, with no pets.    ADVANCED DIRECTIVES: Not in place   HEALTH MAINTENANCE: Social History  Substance Use Topics  . Smoking status: Never Smoker   . Smokeless tobacco: Never Used  . Alcohol Use: Yes     Comment: 10 12 glasses/week     Colonoscopy: Never  PAP: 1990  Bone density: Never  Lipid panel:  No Known Allergies  Current Outpatient Prescriptions  Medication Sig Dispense Refill  . calcium carbonate (TUMS - DOSED IN MG ELEMENTAL CALCIUM) 500 MG chewable tablet Chew 1 tablet by mouth daily.    . capecitabine (XELODA) 500 MG tablet Take 3 tablets (1,500 mg total) by mouth 2 (two) times daily after a meal. 84 tablet 6  . darbepoetin (ARANESP) 100 MCG/0.5ML SOLN injection Inject 500 mcg into the skin.     Marland Kitchen denosumab (XGEVA) 120 MG/1.7ML SOLN injection Inject 120 mg into the skin every 28 (twenty-eight) days.    . ALPRAZolam (XANAX) 0.5 MG tablet Take 1 tablet (0.5 mg total)  by mouth at bedtime as needed for anxiety. (Patient not taking: Reported on 08/09/2014) 10 tablet 0   No current facility-administered medications for this visit.    OBJECTIVE: Middle-aged white woman who appears stated age 69 Vitals:   02/02/15 1334  BP: 132/71  Pulse: 95  Temp: 97.8 F (36.6 C)  Resp: 20     Body mass index is 19.49 kg/(m^2).    ECOG FS:1 - Symptomatic but completely ambulatory  Skin: warm, dry  HEENT: sclerae anicteric, conjunctivae pink, oropharynx clear. No thrush or mucositis.  Lymph Nodes: No cervical or supraclavicular lymphadenopathy  Lungs: clear to auscultation bilaterally, no rales, wheezes, or rhonci  Heart: regular rate and rhythm  Abdomen: round, soft, non tender, positive bowel sounds  Musculoskeletal: No focal spinal tenderness, no peripheral edema  Neuro: non focal, well oriented, positive affect  Breasts: deferred  LAB RESULTS:  CMP     Component Value Date/Time   NA 140 02/02/2015 1310   NA 134* 09/15/2014 1449   K 4.4 02/02/2015 1310   K 4.0 09/15/2014 1449   CL 101 09/15/2014 1449   CO2 23 02/02/2015 1310   CO2 22 09/15/2014 1449  GLUCOSE 113 02/02/2015 1310   GLUCOSE 130* 09/15/2014 1449   BUN 23.4 02/02/2015 1310   BUN 17 09/15/2014 1449   CREATININE 0.9 02/02/2015 1310   CREATININE 0.93 09/15/2014 1449   CALCIUM 8.9 02/02/2015 1310   CALCIUM 8.9 09/15/2014 1449   PROT 6.2* 02/02/2015 1310   PROT 6.6 09/15/2014 1449   ALBUMIN 2.7* 02/02/2015 1310   ALBUMIN 3.4* 09/15/2014 1449   AST 151* 02/02/2015 1310   AST 83* 09/15/2014 1449   ALT 47 02/02/2015 1310   ALT 41* 09/15/2014 1449   ALKPHOS 499* 02/02/2015 1310   ALKPHOS 238* 09/15/2014 1449   BILITOT 2.36* 02/02/2015 1310   BILITOT 0.6 09/15/2014 1449    I No results found for: SPEP  Lab Results  Component Value Date   WBC 4.0 02/02/2015   NEUTROABS 2.1 02/02/2015   HGB 9.6* 02/02/2015   HCT 30.1* 02/02/2015   MCV 113.6* 02/02/2015   PLT 122* 02/02/2015       Chemistry      Component Value Date/Time   NA 140 02/02/2015 1310   NA 134* 09/15/2014 1449   K 4.4 02/02/2015 1310   K 4.0 09/15/2014 1449   CL 101 09/15/2014 1449   CO2 23 02/02/2015 1310   CO2 22 09/15/2014 1449   BUN 23.4 02/02/2015 1310   BUN 17 09/15/2014 1449   CREATININE 0.9 02/02/2015 1310   CREATININE 0.93 09/15/2014 1449      Component Value Date/Time   CALCIUM 8.9 02/02/2015 1310   CALCIUM 8.9 09/15/2014 1449   ALKPHOS 499* 02/02/2015 1310   ALKPHOS 238* 09/15/2014 1449   AST 151* 02/02/2015 1310   AST 83* 09/15/2014 1449   ALT 47 02/02/2015 1310   ALT 41* 09/15/2014 1449   BILITOT 2.36* 02/02/2015 1310   BILITOT 0.6 09/15/2014 1449       No results found for: LABCA2  No components found for: [id number]  No results for input(s): INR in the last 168 hours.  Urinalysis No results found for: COLORURINE  STUDIES: Mr Kizzie Fantasia Contrast  Feb 11, 2015   CLINICAL DATA:  69 year old female with brain metastases from ER positive HER-2 negative invasive ductal carcinoma of the upper outer quadrant of the left breast - stage IV s/p SRS on  03/19/2014- Left lateral cerebellar 4 mm to 20 Gy. - Left medial cerebellar 3 mm to 20 Gy2/06/2014 - Right parietal 3 mm to 20 Gy. - Right parietal 2 mm to 20 Gy.  Restaging.  Subsequent encounter.  EXAM: MRI HEAD WITHOUT AND WITH CONTRAST  TECHNIQUE: Multiplanar, multiecho pulse sequences of the brain and surrounding structures were obtained without and with intravenous contrast.  CONTRAST:  80m MULTIHANCE GADOBENATE DIMEGLUMINE 529 MG/ML IV SOLN  COMPARISON:  10/08/2014 and earlier  FINDINGS: Stable residual left cerebellar enhancing metastasis, about 4 mm (series 10, image 39).  As before the other treated cerebellar metastasis is no longer visible.  Stable punctate residual metastases in the inferior right parietal lobe on series 10, images 105 and 109.  Stable anterior right frontal lobe developmental venous anomaly. No new  intracranial mass or enhancement.  No intracranial mass effect. No restricted diffusion to suggest acute infarction. No ventriculomegaly, extra-axial collection or acute intracranial hemorrhage. Cervicomedullary junction and pituitary are within normal limits. Negative visualized cervical spine. Major intracranial vascular flow voids are stable. Stable gray and white matter signal throughout the brain.  Diffusely decreased and heterogeneous bone marrow signal appears stable since 2015. No destructive osseous lesion identified.  Visible internal auditory structures appear normal. Paranasal sinuses and mastoids are clear. Orbits soft tissues appear stable and normal. Negative scalp soft tissues.  IMPRESSION: 1. Three small treated brain metastases remain visible and are stable since May, and stable to regressed since January. 2. No new brain metastasis identify. No new intracranial abnormality. 3. Diffusely abnormal but stable bone marrow signal.   Electronically Signed   By: Genevie Ann M.D.   On: 01/13/2015 11:33   Mr Liver W Wo Contrast  01/10/2015   CLINICAL DATA:  Subsequent encounter for breast cancer with liver metastases.  EXAM: MRI ABDOMEN WITHOUT AND WITH CONTRAST  TECHNIQUE: Multiplanar multisequence MR imaging of the abdomen was performed both before and after the administration of intravenous contrast.  CONTRAST:  55m MULTIHANCE GADOBENATE DIMEGLUMINE 529 MG/ML IV SOLN  COMPARISON:  11/04/2014.  09/06/2014.  FINDINGS: Lower chest: 10 mm left lower lobe pulmonary nodule is stable to minimally larger. See image 4 series 3.  Hepatobiliary: Multiple pulmonary metastases again noted. Index lesion in the central right liver measured 2.7 x 3.7 cm previously compared to 1.7 x 2.6 cm today.  Inferior right hepatic lobe lesion measured previously at 3.5 x 3.4 cm now measures 2.9 x 2.9 cm.  Posteromedial right hepatic lesion measured previously 2.1 x 1.8 cm now measures 2.5 x 1.8 cm.  2.4 cm lesion seen previously in  the lateral segment of the left liver measures 1.6 cm today.  Gallbladder is without evidence for stones. Mild gallbladder wall thickening is evident. No intrahepatic or extrahepatic biliary dilation.  Pancreas: No focal mass lesion. No dilatation of the main duct. No intraparenchymal cyst. No peripancreatic edema.  Spleen: No splenomegaly. No focal mass lesion.  Adrenals/Urinary Tract: No adrenal nodule or mass. Right-sided hydronephrosis has progressed in the interval with no evidence for right-sided hydroureter suggesting obstruction is at the level of the UPJ. Left kidney is stable.  Stomach/Bowel: Stomach is nondistended. No gastric wall thickening. No evidence of outlet obstruction. No evidence for small bowel obstruction.  Vascular/Lymphatic: No abdominal aortic aneurysm. The portal vein and superior mesenteric vein are patent.  Other: Slight interval increase in volume of mild perihepatic and perisplenic ascites.  Musculoskeletal: Diffusely abnormal marrow enhancement consistent with widespread bony metastatic involvement.  IMPRESSION: 1. Widespread metastatic disease in the liver parenchyma again noted. No substantial interval change although there does appear to be a slight interval improvement. Three of the for index lesions in the liver measure smaller on today's exam. 2. Stable to slight increase in the left lower lobe pulmonary nodule. 3. Widespread heterogeneous marrow enhancement consistent with bony metastatic disease. 4. Slight interval increase in perihepatic and perisplenic ascites. Free fluid is also visible in the pelvis. Free fluid may account for the apparent mild gallbladder wall thickening. 5. Interval worsening of right hydronephrosis.   Electronically Signed   By: EMisty StanleyM.D.   On: 01/10/2015 18:03     ASSESSMENT: 69y.o. [place] woman status post left breast and left axillary lymph node biopsy as well as right infraclavicular chest wall biopsy 10/23/2013 for a clinical T3,  N2, M1, stage IV invasive ductal carcinoma, grade 2, estrogen receptor 100% positive, progesterone receptor 23% positive, with an MIB-1 of 61%, and no HER-2 amplification.  (1) staging studies 11/05/2013 showed metastatic involvement of bone, liver, lungs, and brain  (2) antiestrogen therapy with letrozole and fulvestrant as well as targeted therapy with palbociclib started 11/11/2013, discontinued January 2015 with progression  (3) denosumab monthly started 11/11/2013  (  4) s/p stereotactic radiosurgery to brain metastases 03/19/2014 - Left lateral cerebellar 4 mm to 20 Gy.   - Left medial cerebellar 3 mm to 20 Gy  (5) Right hydronephrosis w/o obstruction-- followed by urology ([facility])--resolved as of abd MRI 06/02/2014  (6) exemestane and everolimus started 06/07/2014, discontinued 11/10/2014 with progression  (7) s/p further stereotactic radiosurgery 07/05/2014  R parietal 3 mm target: 20 Gy  R parietal 2 mm target: 20 Gy  (8) anemia of CKD;  started darbepoietin 08/04/2014; dose increased 09/15/2014 and again 11/10/2014; ferritin checked regularly  (9) capecitabine 1.5 g BID 7 days on/ 7days off started 11/13/2014  PLAN: Julene continues to tolerate treatment well, besides fatigue. The labs were reviewed in detail and her bilirubin continues to trend upwards. She is asymptomatic at this time. She is due to start her next cycle of capecitabine this Saturday.   We will repeat a liver MRI in 6 weeks.  Caryle will return in 2 weeks prior to her next cycle of capecitabine. She understands and agrees with this plan. She knows the goal of treatment in her case is control. She has been encouraged to call with any issues that might arise before her next visit here.   Laurie Panda, NP

## 2015-02-16 ENCOUNTER — Encounter: Payer: Self-pay | Admitting: Nurse Practitioner

## 2015-02-16 ENCOUNTER — Ambulatory Visit (HOSPITAL_BASED_OUTPATIENT_CLINIC_OR_DEPARTMENT_OTHER): Payer: Medicare Other | Admitting: Nurse Practitioner

## 2015-02-16 ENCOUNTER — Ambulatory Visit: Payer: Medicare Other

## 2015-02-16 ENCOUNTER — Other Ambulatory Visit: Payer: Medicare Other

## 2015-02-16 ENCOUNTER — Other Ambulatory Visit (HOSPITAL_BASED_OUTPATIENT_CLINIC_OR_DEPARTMENT_OTHER): Payer: Medicare Other

## 2015-02-16 ENCOUNTER — Ambulatory Visit (HOSPITAL_BASED_OUTPATIENT_CLINIC_OR_DEPARTMENT_OTHER): Payer: Medicare Other

## 2015-02-16 VITALS — BP 144/72 | HR 92 | Temp 97.6°F | Resp 18 | Ht 66.0 in | Wt 134.6 lb

## 2015-02-16 DIAGNOSIS — R600 Localized edema: Secondary | ICD-10-CM | POA: Insufficient documentation

## 2015-02-16 DIAGNOSIS — C50412 Malignant neoplasm of upper-outer quadrant of left female breast: Secondary | ICD-10-CM | POA: Diagnosis present

## 2015-02-16 DIAGNOSIS — N183 Chronic kidney disease, stage 3 unspecified: Secondary | ICD-10-CM

## 2015-02-16 DIAGNOSIS — C7800 Secondary malignant neoplasm of unspecified lung: Secondary | ICD-10-CM

## 2015-02-16 DIAGNOSIS — C7951 Secondary malignant neoplasm of bone: Principal | ICD-10-CM

## 2015-02-16 DIAGNOSIS — D630 Anemia in neoplastic disease: Secondary | ICD-10-CM

## 2015-02-16 DIAGNOSIS — C7931 Secondary malignant neoplasm of brain: Secondary | ICD-10-CM

## 2015-02-16 DIAGNOSIS — N189 Chronic kidney disease, unspecified: Secondary | ICD-10-CM

## 2015-02-16 DIAGNOSIS — N1330 Unspecified hydronephrosis: Secondary | ICD-10-CM

## 2015-02-16 DIAGNOSIS — C773 Secondary and unspecified malignant neoplasm of axilla and upper limb lymph nodes: Secondary | ICD-10-CM | POA: Diagnosis not present

## 2015-02-16 DIAGNOSIS — Z23 Encounter for immunization: Secondary | ICD-10-CM | POA: Diagnosis not present

## 2015-02-16 DIAGNOSIS — D631 Anemia in chronic kidney disease: Secondary | ICD-10-CM

## 2015-02-16 DIAGNOSIS — E8809 Other disorders of plasma-protein metabolism, not elsewhere classified: Secondary | ICD-10-CM | POA: Insufficient documentation

## 2015-02-16 DIAGNOSIS — C50919 Malignant neoplasm of unspecified site of unspecified female breast: Secondary | ICD-10-CM

## 2015-02-16 DIAGNOSIS — C787 Secondary malignant neoplasm of liver and intrahepatic bile duct: Secondary | ICD-10-CM

## 2015-02-16 DIAGNOSIS — R17 Unspecified jaundice: Secondary | ICD-10-CM

## 2015-02-16 DIAGNOSIS — C50912 Malignant neoplasm of unspecified site of left female breast: Secondary | ICD-10-CM

## 2015-02-16 LAB — CBC WITH DIFFERENTIAL/PLATELET
BASO%: 2 % (ref 0.0–2.0)
Basophils Absolute: 0.1 10*3/uL (ref 0.0–0.1)
EOS%: 3.1 % (ref 0.0–7.0)
Eosinophils Absolute: 0.1 10*3/uL (ref 0.0–0.5)
HEMATOCRIT: 29 % — AB (ref 34.8–46.6)
HGB: 9.3 g/dL — ABNORMAL LOW (ref 11.6–15.9)
LYMPH#: 1 10*3/uL (ref 0.9–3.3)
LYMPH%: 22 % (ref 14.0–49.7)
MCH: 37.2 pg — ABNORMAL HIGH (ref 25.1–34.0)
MCHC: 32.1 g/dL (ref 31.5–36.0)
MCV: 116 fL — ABNORMAL HIGH (ref 79.5–101.0)
MONO#: 0.9 10*3/uL (ref 0.1–0.9)
MONO%: 20 % — ABNORMAL HIGH (ref 0.0–14.0)
NEUT#: 2.4 10*3/uL (ref 1.5–6.5)
NEUT%: 52.9 % (ref 38.4–76.8)
PLATELETS: 119 10*3/uL — AB (ref 145–400)
RBC: 2.5 10*6/uL — ABNORMAL LOW (ref 3.70–5.45)
RDW: 25.7 % — AB (ref 11.2–14.5)
WBC: 4.5 10*3/uL (ref 3.9–10.3)

## 2015-02-16 LAB — COMPREHENSIVE METABOLIC PANEL (CC13)
ALBUMIN: 2.4 g/dL — AB (ref 3.5–5.0)
ALK PHOS: 462 U/L — AB (ref 40–150)
ALT: 44 U/L (ref 0–55)
ANION GAP: 8 meq/L (ref 3–11)
AST: 151 U/L — ABNORMAL HIGH (ref 5–34)
BUN: 22.9 mg/dL (ref 7.0–26.0)
CALCIUM: 7.8 mg/dL — AB (ref 8.4–10.4)
CO2: 19 mEq/L — ABNORMAL LOW (ref 22–29)
Chloride: 110 mEq/L — ABNORMAL HIGH (ref 98–109)
Creatinine: 0.9 mg/dL (ref 0.6–1.1)
EGFR: 69 mL/min/{1.73_m2} — AB (ref >90)
Glucose: 102 mg/dl (ref 70–140)
Potassium: 3.9 mEq/L (ref 3.5–5.1)
Sodium: 137 mEq/L (ref 136–145)
Total Bilirubin: 2.86 mg/dL — ABNORMAL HIGH (ref 0.20–1.20)
Total Protein: 5.9 g/dL — ABNORMAL LOW (ref 6.4–8.3)

## 2015-02-16 LAB — FERRITIN CHCC: Ferritin: 1664 ng/ml — ABNORMAL HIGH (ref 9–269)

## 2015-02-16 LAB — RETICULOCYTES
Immature Retic Fract: 26.3 % — ABNORMAL HIGH (ref 1.60–10.00)
RBC: 2.51 10*6/uL — ABNORMAL LOW (ref 3.70–5.45)
RETIC CT ABS: 136.54 10*3/uL — AB (ref 33.70–90.70)
Retic %: 5.44 % — ABNORMAL HIGH (ref 0.70–2.10)

## 2015-02-16 LAB — TECHNOLOGIST REVIEW: Technologist Review: 2

## 2015-02-16 MED ORDER — INFLUENZA VAC SPLIT QUAD 0.5 ML IM SUSY
0.5000 mL | PREFILLED_SYRINGE | Freq: Once | INTRAMUSCULAR | Status: AC
  Administered 2015-02-16: 0.5 mL via INTRAMUSCULAR
  Filled 2015-02-16: qty 0.5

## 2015-02-16 MED ORDER — DARBEPOETIN ALFA 500 MCG/ML IJ SOSY
500.0000 ug | PREFILLED_SYRINGE | Freq: Once | INTRAMUSCULAR | Status: AC
Start: 2015-02-16 — End: 2015-02-16
  Administered 2015-02-16: 500 ug via SUBCUTANEOUS
  Filled 2015-02-16: qty 1

## 2015-02-16 NOTE — Progress Notes (Signed)
Vicki Marshall  [phone] [phone]     ID: Vicki Marshall OB: [birth year]  MR#: [id number]  ZSW#:[id number]  PCP: Vicki Coma, MD GYN:   SU: Vicki Marshall OTHER MD: Vicki Marshall, Vicki Marshall, Vicki Marshall DDS, Vicki Marshall, Vicki Marshall  CHIEF COMPLAINT: stage IV breast cancer, estrogen receptor positive  TREATMENT: capecitabine, aranesp, and denosumab  BREAST CANCER HISTORY: From the original intake note 11/04/2013:  Vicki Marshall noted a change in her left breast approximately mid 2014. This was a flattening of the lateral aspect of the left breast. Eventually she noted an indentation and eventually a "sore" in that area. She had not shared this information with her husband, and she did not have a primary care physician. More recently, as she started to have symptoms related to this, she mentioned it to her husband Vicki Marshall and she establish yourself with Dr. Stephanie Marshall, who set her up for bilateral diagnostic mammography and ultrasonography at the [facility] 10/23/2013.. This showed a breast density to be category C. The right breast was negative.  The left breast contained an ulcerated mass laterally measuring at least 4 cm mammography. On physical exam there was an ulcerated mass in the 2:00 location of the left breast, associated with erythematous skin nodules in the upper inner quadrant. The left breast was smaller than the right, and firm. There were palpable left axillary lymph nodes. In addition, on the right upper chest wall, infraclavicular like, there was a firm nodule separate from the bony structures. There was also a right preauricular node and a firm left submandibular node.  Ultrasound showed multiple nodules throughout the lower portion of the left breast. There was a large mass in the upper outer quadrant of the left breast contiguous with the skin ulceration, measuring greater than 5 cm. The left axilla  showed at least 3 suspicious masses, the largest measuring 1.9 cm. Ultrasound of the palpable abnormality of the right chest wall showed a superficial irregular mass, subdermal, measuring 0.8 cm.  Biopsies of the left breast mass, a left axillary lymph node, and the right infra-clavicular mass on 10/23/2013, all showed ([id number]) and invasive ductal carcinoma, grade 1 or 2, estrogen receptor 100% positive, progesterone receptor 23% positive, both with strong staining intensity, with an MIB-1 of 61%, and no HER-2 amplification, the signals ratio being 0.90, and the number per cell 1.80.  On 11/02/2013 the patient underwent bilateral breast MRI and this showed, in the left breast, a 5.8 cm mass eroding through the overlying skin, and also invading the underlying pectoralis muscle and chest wall. There were multiple enhancing nodules throughout the left breast and an adjacent 4.8 cm irregular enhancing mass inferiorly. There are multiple large irregular left axillary lymph nodes, the largest measuring 2.4 cm. There was a 0.9 cm left subpectoral lymph node as well as prevascular and precarinal lymph nodes.  In the right breast far upper inner quadrant there was a 0.8 cm irregular enhancing mass abutting the overlying skin. This is the mass that was biopsied and shown to be positive. In addition there was patchy heterogeneous enhancement of the sternum.  The patient's subsequent history is as detailed below  INTERVAL HISTORY: Vicki Marshall returns today for follow-up of her metastatic breast cancer, accompanied by her husband, Vicki Marshall.  Today is day 12, cycle 7 of capecitabine 1.21m taken BID 7 days on, 7 days off. She continues to tolerate treatment well. She denies mouth sores, palmar/plantar rashes or peeling, and diarrhea. She has  a 14lb weight gain in the past 2 weeks, and it looks to be fluid. She complains of bloating and abdominal tightness. In addition she has had a recent onset of bilateral lower  extremity edema, that was previously only in her ankles.   REVIEW OF SYSTEMS: Besides fatigue, a detailed review of systems is otherwise entirely negative, except where noted above.  PAST MEDICAL HISTORY: Past Medical History  Diagnosis Date  . Urinary tract infection   . Anemia   . Breast cancer     T3N2M1 stage IV invasive ductal carcinoma   . Brain metastases   . Hydronephrosis, left   . Lung metastases   . Bone metastases     PAST SURGICAL HISTORY: Past Surgical History  Procedure Laterality Date  . Cataract extraction  2013  . Mouth surgery  1981    FAMILY HISTORY: The patient's father died at the age of 3, from pneumonia. The patient's mother lived to be 80. The patient had no brothers, one sister. There is no history of breast or ovarian cancer in the family  GYNECOLOGIC HISTORY:  Menarche age 32. The patient is GX P0. She stopped having periods approximately 2003. She did not take hormone replacement. She used birth control pills for approximately 8 years remotely, with no complications   SOCIAL HISTORY:  [name] retired about a year ago. 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 Her husband Vicki Marshall is retired from working in [employer] fundraiser. They live alone, with no pets.    ADVANCED DIRECTIVES: Not in place   HEALTH MAINTENANCE: Social History  Substance Use Topics  . Smoking status: Never Smoker   . Smokeless tobacco: Never Used  . Alcohol Use: Yes     Comment: 10 12 glasses/week     Colonoscopy: Never  PAP: 1990  Bone density: Never  Lipid panel:  No Known Allergies  Current Outpatient Prescriptions  Medication Sig Dispense Refill  . calcium carbonate (TUMS - DOSED IN MG ELEMENTAL CALCIUM) 500 MG chewable tablet Chew 1 tablet by mouth daily.    . capecitabine (XELODA) 500 MG tablet Take 3 tablets (1,500 mg total) by mouth 2 (two) times daily after a meal. 84 tablet 6  . darbepoetin (ARANESP) 100 MCG/0.5ML SOLN injection Inject  500 mcg into the skin.     Marland Kitchen denosumab (XGEVA) 120 MG/1.7ML SOLN injection Inject 120 mg into the skin every 28 (twenty-eight) days.    . ALPRAZolam (XANAX) 0.5 MG tablet Take 1 tablet (0.5 mg total) by mouth at bedtime as needed for anxiety. (Patient not taking: Reported on 08/09/2014) 10 tablet 0   No current facility-administered medications for this visit.    OBJECTIVE: Middle-aged white woman who appears stated age 25 Vitals:   02/16/15 1435  BP: 144/72  Pulse: 92  Temp: 97.6 F (36.4 C)  Resp: 18     Body mass index is 21.74 kg/(m^2).    ECOG FS:1 - Symptomatic but completely ambulatory  Skin: warm, dry  HEENT: sclerae anicteric, conjunctivae pink, oropharynx clear. No thrush or mucositis.  Lymph Nodes: No cervical or supraclavicular lymphadenopathy  Lungs: clear to auscultation bilaterally, no rales, wheezes, or rhonci  Heart: regular rate and rhythm  Abdomen: round, soft, non tender, positive bowel sounds  Musculoskeletal: No focal spinal tenderness, +2 bilateral lower extremity edema, non-pitting Neuro: non focal, well oriented, positive affect  Breasts: deferred  LAB RESULTS:  CMP     Component Value Date/Time   NA 137 02/16/2015 1408  NA 134* 09/15/2014 1449   K 3.9 02/16/2015 1408   K 4.0 09/15/2014 1449   CL 101 09/15/2014 1449   CO2 19* 02/16/2015 1408   CO2 22 09/15/2014 1449   GLUCOSE 102 02/16/2015 1408   GLUCOSE 130* 09/15/2014 1449   BUN 22.9 02/16/2015 1408   BUN 17 09/15/2014 1449   CREATININE 0.9 02/16/2015 1408   CREATININE 0.93 09/15/2014 1449   CALCIUM 7.8* 02/16/2015 1408   CALCIUM 8.9 09/15/2014 1449   PROT 5.9* 02/16/2015 1408   PROT 6.6 09/15/2014 1449   ALBUMIN 2.4* 02/16/2015 1408   ALBUMIN 3.4* 09/15/2014 1449   AST 151* 02/16/2015 1408   AST 83* 09/15/2014 1449   ALT 44 02/16/2015 1408   ALT 41* 09/15/2014 1449   ALKPHOS 462* 02/16/2015 1408   ALKPHOS 238* 09/15/2014 1449   BILITOT 2.86* 02/16/2015 1408   BILITOT 0.6  09/15/2014 1449    I No results found for: SPEP  Lab Results  Component Value Date   WBC 4.5 02/16/2015   NEUTROABS 2.4 02/16/2015   HGB 9.3* 02/16/2015   HCT 29.0* 02/16/2015   MCV 116.0* 02/16/2015   PLT 119* 02/16/2015      Chemistry      Component Value Date/Time   NA 137 02/16/2015 1408   NA 134* 09/15/2014 1449   K 3.9 02/16/2015 1408   K 4.0 09/15/2014 1449   CL 101 09/15/2014 1449   CO2 19* 02/16/2015 1408   CO2 22 09/15/2014 1449   BUN 22.9 02/16/2015 1408   BUN 17 09/15/2014 1449   CREATININE 0.9 02/16/2015 1408   CREATININE 0.93 09/15/2014 1449      Component Value Date/Time   CALCIUM 7.8* 02/16/2015 1408   CALCIUM 8.9 09/15/2014 1449   ALKPHOS 462* 02/16/2015 1408   ALKPHOS 238* 09/15/2014 1449   AST 151* 02/16/2015 1408   AST 83* 09/15/2014 1449   ALT 44 02/16/2015 1408   ALT 41* 09/15/2014 1449   BILITOT 2.86* 02/16/2015 1408   BILITOT 0.6 09/15/2014 1449       No results found for: LABCA2  No components found for: [id number]  No results for input(s): INR in the last 168 hours.  Urinalysis No results found for: COLORURINE  STUDIES: No results found.  ASSESSMENT: 69 y.o. [place] woman status post left breast and left axillary lymph node biopsy as well as right infraclavicular chest wall biopsy 10/23/2013 for a clinical T3, N2, M1, stage IV invasive ductal carcinoma, grade 2, estrogen receptor 100% positive, progesterone receptor 23% positive, with an MIB-1 of 61%, and no HER-2 amplification.  (1) staging studies 11/05/2013 showed metastatic involvement of bone, liver, lungs, and brain  (2) antiestrogen therapy with letrozole and fulvestrant as well as targeted therapy with palbociclib started 11/11/2013, discontinued January 2015 with progression  (3) denosumab monthly started 11/11/2013  (4) s/p stereotactic radiosurgery to brain metastases 03/19/2014 - Left lateral cerebellar 4 mm to 20 Gy.   - Left medial cerebellar 3 mm to 20  Gy  (5) Right hydronephrosis w/o obstruction-- followed by urology ([facility])--resolved as of abd MRI 06/02/2014  (6) exemestane and everolimus started 06/07/2014, discontinued 11/10/2014 with progression  (7) s/p further stereotactic radiosurgery 07/05/2014  R parietal 3 mm target: 20 Gy  R parietal 2 mm target: 20 Gy  (8) anemia of CKD;  started darbepoietin 08/04/2014; dose increased 09/15/2014 and again 11/10/2014; ferritin checked regularly  (9) capecitabine 1.5 g BID 7 days on/ 7days off started 11/13/2014  PLAN: [place] is  having a shift of her fluid into the 3rd space. Her albumin level is low and her bilirubin level is creeping up, suggesting her liver is "unhappy," despite recent scans showing a stable/decreased appearance of her metastatic lesions. Dr. Jana Hakim offered a paracentesis if she though that would make her more comfortable, but really it is her ankle tightness that gives her the most trouble. She declines the paracentesis at this time.  I have written a prescription for compression stockings to take to the medical supply store. She will of course avoid salt and elevate her legs above her heart when she is not walking or driving. She will also incorporate more protein into her diet.   Maeby starts cycle 8 of capecitabine this Saturday. She will return in 2 weeks for labs and a follow up visit. She understands and agrees with this plan. She knows the goal of treatment in her case is control. She has been encouraged to call with any issues that might arise before her next visit here.  Laurie Panda, NP

## 2015-03-01 ENCOUNTER — Telehealth: Payer: Self-pay | Admitting: *Deleted

## 2015-03-01 NOTE — Telephone Encounter (Signed)
Returned call to pt concerning weight gain. Pt called b/c she has gained an additional 10 lbs since last seen in office. Pt was seen on the 14th. Pt's weight has been gradually increasing since Aug 17th but has not had any discomfort until now. Pt complains that since the last weight increase it's becoming more of an effort when walking as far as SOB, but feels okay when sitting. Pt also says despite wearing her compression hoses, her legs still feels heavy and her stomach feels"full", therefore, she is not able to eat as much at one time. Pt states, she would be interested in getting some of this fluid drained to get rid of the discomfort. Pt is also having some peeling to the feet and hands, on the feet the skin is cracked. She's been using Lubriderm to these areas. Pt will be coming to appt on tomorrow at 12:00. I told her I will give her some samples of Aquaphor. Pt just wanted to give [place] an update before her appt on tomorrow.Message to be fwd to Dr. Jana Hakim.

## 2015-03-02 ENCOUNTER — Ambulatory Visit (HOSPITAL_BASED_OUTPATIENT_CLINIC_OR_DEPARTMENT_OTHER): Payer: Medicare Other | Admitting: Oncology

## 2015-03-02 ENCOUNTER — Ambulatory Visit (HOSPITAL_COMMUNITY): Admission: RE | Admit: 2015-03-02 | Payer: Medicare Other | Source: Ambulatory Visit

## 2015-03-02 ENCOUNTER — Other Ambulatory Visit (HOSPITAL_BASED_OUTPATIENT_CLINIC_OR_DEPARTMENT_OTHER): Payer: Medicare Other

## 2015-03-02 ENCOUNTER — Other Ambulatory Visit: Payer: Medicare Other

## 2015-03-02 ENCOUNTER — Ambulatory Visit (HOSPITAL_COMMUNITY)
Admission: RE | Admit: 2015-03-02 | Discharge: 2015-03-02 | Disposition: A | Discharge status: Home / Self Care | Payer: Medicare Other | Source: Ambulatory Visit | Attending: Oncology | Admitting: Oncology

## 2015-03-02 ENCOUNTER — Ambulatory Visit: Payer: Medicare Other

## 2015-03-02 ENCOUNTER — Telehealth: Payer: Self-pay | Admitting: Oncology

## 2015-03-02 ENCOUNTER — Other Ambulatory Visit: Payer: Self-pay | Admitting: Oncology

## 2015-03-02 ENCOUNTER — Ambulatory Visit (HOSPITAL_BASED_OUTPATIENT_CLINIC_OR_DEPARTMENT_OTHER): Payer: Medicare Other

## 2015-03-02 VITALS — BP 125/68 | HR 93 | Temp 97.3°F | Resp 18 | Ht 66.0 in | Wt 146.6 lb

## 2015-03-02 DIAGNOSIS — C7951 Secondary malignant neoplasm of bone: Secondary | ICD-10-CM

## 2015-03-02 DIAGNOSIS — C50912 Malignant neoplasm of unspecified site of left female breast: Secondary | ICD-10-CM

## 2015-03-02 DIAGNOSIS — C50412 Malignant neoplasm of upper-outer quadrant of left female breast: Secondary | ICD-10-CM

## 2015-03-02 DIAGNOSIS — C50919 Malignant neoplasm of unspecified site of unspecified female breast: Secondary | ICD-10-CM

## 2015-03-02 DIAGNOSIS — C773 Secondary and unspecified malignant neoplasm of axilla and upper limb lymph nodes: Secondary | ICD-10-CM | POA: Diagnosis not present

## 2015-03-02 DIAGNOSIS — C7800 Secondary malignant neoplasm of unspecified lung: Secondary | ICD-10-CM | POA: Diagnosis not present

## 2015-03-02 DIAGNOSIS — N183 Chronic kidney disease, stage 3 unspecified: Secondary | ICD-10-CM

## 2015-03-02 DIAGNOSIS — N189 Chronic kidney disease, unspecified: Secondary | ICD-10-CM

## 2015-03-02 DIAGNOSIS — Z170 Estrogen receptor positive status [ER+]: Secondary | ICD-10-CM

## 2015-03-02 DIAGNOSIS — C7931 Secondary malignant neoplasm of brain: Principal | ICD-10-CM

## 2015-03-02 DIAGNOSIS — C787 Secondary malignant neoplasm of liver and intrahepatic bile duct: Secondary | ICD-10-CM

## 2015-03-02 DIAGNOSIS — D631 Anemia in chronic kidney disease: Secondary | ICD-10-CM

## 2015-03-02 DIAGNOSIS — R188 Other ascites: Secondary | ICD-10-CM | POA: Insufficient documentation

## 2015-03-02 DIAGNOSIS — N1330 Unspecified hydronephrosis: Secondary | ICD-10-CM

## 2015-03-02 DIAGNOSIS — D630 Anemia in neoplastic disease: Secondary | ICD-10-CM

## 2015-03-02 LAB — CBC WITH DIFFERENTIAL/PLATELET
BASO%: 1 % (ref 0.0–2.0)
BASOS ABS: 0.1 10*3/uL (ref 0.0–0.1)
EOS%: 5 % (ref 0.0–7.0)
Eosinophils Absolute: 0.3 10*3/uL (ref 0.0–0.5)
HEMATOCRIT: 31.5 % — AB (ref 34.8–46.6)
HEMOGLOBIN: 10 g/dL — AB (ref 11.6–15.9)
LYMPH#: 0.9 10*3/uL (ref 0.9–3.3)
LYMPH%: 18 % (ref 14.0–49.7)
MCH: 37 pg — AB (ref 25.1–34.0)
MCHC: 31.7 g/dL (ref 31.5–36.0)
MCV: 116.7 fL — ABNORMAL HIGH (ref 79.5–101.0)
MONO#: 0.7 10*3/uL (ref 0.1–0.9)
MONO%: 13.6 % (ref 0.0–14.0)
NEUT#: 3.3 10*3/uL (ref 1.5–6.5)
NEUT%: 62.4 % (ref 38.4–76.8)
NRBC: 3 % — AB (ref 0–0)
Platelets: 120 10*3/uL — ABNORMAL LOW (ref 145–400)
RBC: 2.7 10*6/uL — ABNORMAL LOW (ref 3.70–5.45)
RDW: 23.7 % — AB (ref 11.2–14.5)
WBC: 5.2 10*3/uL (ref 3.9–10.3)

## 2015-03-02 LAB — COMPREHENSIVE METABOLIC PANEL (CC13)
ALBUMIN: 2.2 g/dL — AB (ref 3.5–5.0)
ALK PHOS: 450 U/L — AB (ref 40–150)
ALT: 45 U/L (ref 0–55)
AST: 171 U/L (ref 5–34)
Anion Gap: 8 mEq/L (ref 3–11)
BILIRUBIN TOTAL: 4.07 mg/dL — AB (ref 0.20–1.20)
BUN: 31.1 mg/dL — ABNORMAL HIGH (ref 7.0–26.0)
CALCIUM: 7.5 mg/dL — AB (ref 8.4–10.4)
CO2: 18 mEq/L — ABNORMAL LOW (ref 22–29)
Chloride: 111 mEq/L — ABNORMAL HIGH (ref 98–109)
Creatinine: 1 mg/dL (ref 0.6–1.1)
EGFR: 57 mL/min/{1.73_m2} — ABNORMAL LOW (ref >90)
GLUCOSE: 143 mg/dL — AB (ref 70–140)
POTASSIUM: 4.7 meq/L (ref 3.5–5.1)
SODIUM: 136 meq/L (ref 136–145)
TOTAL PROTEIN: 5.6 g/dL — AB (ref 6.4–8.3)

## 2015-03-02 LAB — TECHNOLOGIST REVIEW

## 2015-03-02 MED ORDER — DARBEPOETIN ALFA 500 MCG/ML IJ SOSY
500.0000 ug | PREFILLED_SYRINGE | Freq: Once | INTRAMUSCULAR | Status: AC
  Administered 2015-03-02: 500 ug via SUBCUTANEOUS
  Filled 2015-03-02: qty 1

## 2015-03-02 MED ORDER — DENOSUMAB 120 MG/1.7ML ~~LOC~~ SOLN
120.0000 mg | Freq: Once | SUBCUTANEOUS | Status: AC
  Administered 2015-03-02: 120 mg via SUBCUTANEOUS
  Filled 2015-03-02: qty 1.7

## 2015-03-02 MED ORDER — LIDOCAINE HCL (PF) 1 % IJ SOLN
INTRAMUSCULAR | Status: AC
  Filled 2015-03-02: qty 10

## 2015-03-02 MED ORDER — POTASSIUM CHLORIDE CRYS ER 20 MEQ PO TBCR: 20.0000 meq | EXTENDED_RELEASE_TABLET | Freq: Two times a day (BID) | ORAL | Status: DC

## 2015-03-02 MED ORDER — FUROSEMIDE 20 MG PO TABS: 20.0000 mg | ORAL_TABLET | Freq: Every day | ORAL | Status: AC

## 2015-03-02 NOTE — Progress Notes (Unsigned)
Patient needs a paracentesis- she was not able to get an appt here however Misty called [facility] and set patient up to have the procedure there today at 2:30.  Dr. Jana Hakim also wanted [place] to r/o a biliary obstruction and placed an order.  Writer called [facility] and verified both orders with the scheduler.  Patient aware and will go to [facility] after her injection.

## 2015-03-02 NOTE — Telephone Encounter (Signed)
Gave avs & calendar for October. °

## 2015-03-02 NOTE — Progress Notes (Signed)
[facility]  Telephone:(336) [phone] Fax:(336) [phone]     ID: Vicki Marshall OB: Oct 27, 1945  MR#: [id number]  ZWC#:[id number]  PCP: Vicki Coma, MD GYN:   SU: Vicki Marshall OTHER MD: Vicki Marshall, Vicki Marshall, Vicki Marshall DDS, Vicki Marshall, Vicki Marshall, Vicki Marshall  CHIEF COMPLAINT: stage IV breast cancer, estrogen receptor positive  TREATMENT: [capecitabine], aranesp, denosumab  BREAST CANCER HISTORY: From the original intake note 11/04/2013:  Vicki Marshall noted a change in her left breast approximately mid 2014. This was a flattening of the lateral aspect of the left breast. Eventually she noted an indentation and eventually a "sore" in that area. She had not shared this information with her husband, and she did not have a primary care physician. More recently, as she started to have symptoms related to this, she mentioned it to her husband Vicki Marshall and she establish yourself with Dr. Stephanie Marshall, who set her up for bilateral diagnostic mammography and ultrasonography at the [facility] 10/23/2013.. This showed a breast density to be category C. The right breast was negative.  The left breast contained an ulcerated mass laterally measuring at least 4 cm mammography. On physical exam there was an ulcerated mass in the 2:00 location of the left breast, associated with erythematous skin nodules in the upper inner quadrant. The left breast was smaller than the right, and firm. There were palpable left axillary lymph nodes. In addition, on the right upper chest wall, infraclavicular like, there was a firm nodule separate from the bony structures. There was also a right preauricular node and a firm left submandibular node.  Ultrasound showed multiple nodules throughout the lower portion of the left breast. There was a large mass in the upper outer quadrant of the left breast contiguous with the skin ulceration, measuring greater than 5 cm. The left axilla  showed at least 3 suspicious masses, the largest measuring 1.9 cm. Ultrasound of the palpable abnormality of the right chest wall showed a superficial irregular mass, subdermal, measuring 0.8 cm.  Biopsies of the left breast mass, a left axillary lymph node, and the right infra-clavicular mass on 10/23/2013, all showed ([id number]) and invasive ductal carcinoma, grade 1 or 2, estrogen receptor 100% positive, progesterone receptor 23% positive, both with strong staining intensity, with an MIB-1 of 61%, and no HER-2 amplification, the signals ratio being 0.90, and the number per cell 1.80.  On 11/02/2013 the patient underwent bilateral breast MRI and this showed, in the left breast, a 5.8 cm mass eroding through the overlying skin, and also invading the underlying pectoralis muscle and chest wall. There were multiple enhancing nodules throughout the left breast and an adjacent 4.8 cm irregular enhancing mass inferiorly. There are multiple large irregular left axillary lymph nodes, the largest measuring 2.4 cm. There was a 0.9 cm left subpectoral lymph node as well as prevascular and precarinal lymph nodes.  In the right breast far upper inner quadrant there was a 0.8 cm irregular enhancing mass abutting the overlying skin. This is the mass that was biopsied and shown to be positive. In addition there was patchy heterogeneous enhancement of the sternum.  The patient's subsequent history is as detailed below  INTERVAL HISTORY: Vicki Marshall returns today for follow-up of her metastatic breast cancer, accompanied by her husband, Vicki Marshall.  Today is day 10 cycle 8 of her Xeloda given 1 week on 1 week off. Generally she has tolerated this well, although with the last couple of cycles she has had progressive problems in her  hands and feet. She does have a little difficulty walking as a result. Her hands have begun to crack and peel. There are also hyperpigmented. Diarrhea and mouth sores were not a major issue and she  never developed any problems with her counts, nausea, vomiting, or altered taste.  REVIEW OF SYSTEMS: The big problem Vicki Marshall has been experiencing is related to the fluid. Her weight has gone up by over 20 pounds over the last 2 months and this is all fluid weight. She has been reluctant to undergo paracentesis but is currently accepting that idea. She denies unusual headaches, visual changes, dizziness, or gait imbalance. She feels full easily. There has not been any cough phlegm production or pleurisy. She is not aware of any bleeding or rash problems and she has not had any fever. She is moderately fatigued. Otherwise a detailed review of systems today was stable  PAST MEDICAL HISTORY: Past Medical History  Diagnosis Date  . Urinary tract infection   . Anemia   . Breast cancer     T3N2M1 stage IV invasive ductal carcinoma   . Brain metastases   . Hydronephrosis, left   . Lung metastases   . Bone metastases     PAST SURGICAL HISTORY: Past Surgical History  Procedure Laterality Date  . Cataract extraction  2013  . Mouth surgery  1981    FAMILY HISTORY: The patient's father died at the age of 84, from pneumonia. The patient's mother lived to be 5. The patient had no brothers, one sister. There is no history of breast or ovarian cancer in the family  GYNECOLOGIC HISTORY:  Menarche age 39. The patient is GX P0. She stopped having periods approximately 2003. She did not take hormone replacement. She used birth control pills for approximately 8 years remotely, with no complications   SOCIAL HISTORY:  Vicki Marshall retired about a year ago. 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 Her husband Vicki Marshall is retired from working in [employer] fundraiser. They live alone, with no pets.    ADVANCED DIRECTIVES: Not in place   HEALTH MAINTENANCE: Social History  Substance Use Topics  . Smoking status: Never Smoker   . Smokeless tobacco: Never Used  . Alcohol Use: Yes      Comment: 10 12 glasses/week     Colonoscopy: Never  PAP: 1990  Bone density: Never  Lipid panel:  No Known Allergies  Current Outpatient Prescriptions  Medication Sig Dispense Refill  . ALPRAZolam (XANAX) 0.5 MG tablet Take 1 tablet (0.5 mg total) by mouth at bedtime as needed for anxiety. (Patient not taking: Reported on 08/09/2014) 10 tablet 0  . calcium carbonate (TUMS - DOSED IN MG ELEMENTAL CALCIUM) 500 MG chewable tablet Chew 1 tablet by mouth daily.    . capecitabine (XELODA) 500 MG tablet Take 3 tablets (1,500 mg total) by mouth 2 (two) times daily after a meal. 84 tablet 6  . darbepoetin (ARANESP) 100 MCG/0.5ML SOLN injection Inject 500 mcg into the skin.     Marland Kitchen denosumab (XGEVA) 120 MG/1.7ML SOLN injection Inject 120 mg into the skin every 28 (twenty-eight) days.     No current facility-administered medications for this visit.    OBJECTIVE: Middle-aged white woman  Filed Vitals:   03/02/15 1145  BP: 125/68  Pulse: 93  Temp: 97.3 F (36.3 C)  Resp: 18     Body mass index is 23.67 kg/(m^2).    ECOG FS:2 - Symptomatic, <50% confined to bed  Sclerae unicteric,  pupils round and equal Oropharynx clear, slightly dry No cervical or supraclavicular adenopathy Lungs no rales or rhonchi, Heart regular rate and rhythm Abd distended, nontender, positive bowel sounds MSK no focal spinal tenderness, grade 2 bilateral lower extremity edema, compression stockings in place Neuro: nonfocal, well oriented, appropriate affect Breasts: Deferred Skin: The skin over both hands is hyperpigmented. The hands are very dry. There is some cracking and fissures on the palmar side. There are mild nail changes   LAB RESULTS:  CMP     Component Value Date/Time   NA 137 02/16/2015 1408   NA 134* 09/15/2014 1449   K 3.9 02/16/2015 1408   K 4.0 09/15/2014 1449   CL 101 09/15/2014 1449   CO2 19* 02/16/2015 1408   CO2 22 09/15/2014 1449   GLUCOSE 102 02/16/2015 1408   GLUCOSE 130* 09/15/2014  1449   BUN 22.9 02/16/2015 1408   BUN 17 09/15/2014 1449   CREATININE 0.9 02/16/2015 1408   CREATININE 0.93 09/15/2014 1449   CALCIUM 7.8* 02/16/2015 1408   CALCIUM 8.9 09/15/2014 1449   PROT 5.9* 02/16/2015 1408   PROT 6.6 09/15/2014 1449   ALBUMIN 2.4* 02/16/2015 1408   ALBUMIN 3.4* 09/15/2014 1449   AST 151* 02/16/2015 1408   AST 83* 09/15/2014 1449   ALT 44 02/16/2015 1408   ALT 41* 09/15/2014 1449   ALKPHOS 462* 02/16/2015 1408   ALKPHOS 238* 09/15/2014 1449   BILITOT 2.86* 02/16/2015 1408   BILITOT 0.6 09/15/2014 1449    I No results found for: SPEP  Lab Results  Component Value Date   WBC 5.2 03/02/2015   NEUTROABS 3.3 03/02/2015   HGB 10.0* 03/02/2015   HCT 31.5* 03/02/2015   MCV 116.7* 03/02/2015   PLT 120* 03/02/2015      Chemistry      Component Value Date/Time   NA 137 02/16/2015 1408   NA 134* 09/15/2014 1449   K 3.9 02/16/2015 1408   K 4.0 09/15/2014 1449   CL 101 09/15/2014 1449   CO2 19* 02/16/2015 1408   CO2 22 09/15/2014 1449   BUN 22.9 02/16/2015 1408   BUN 17 09/15/2014 1449   CREATININE 0.9 02/16/2015 1408   CREATININE 0.93 09/15/2014 1449      Component Value Date/Time   CALCIUM 7.8* 02/16/2015 1408   CALCIUM 8.9 09/15/2014 1449   ALKPHOS 462* 02/16/2015 1408   ALKPHOS 238* 09/15/2014 1449   AST 151* 02/16/2015 1408   AST 83* 09/15/2014 1449   ALT 44 02/16/2015 1408   ALT 41* 09/15/2014 1449   BILITOT 2.86* 02/16/2015 1408   BILITOT 0.6 09/15/2014 1449     Results for CHUNDRA, SAUERWEIN (MRN [id number]) as of 03/02/2015 11:59  Ref. Range 12/22/2014 13:53 01/05/2015 13:39 01/19/2015 12:35 02/02/2015 13:10 02/16/2015 14:08  Albumin Latest Ref Range: 3.5-5.0 g/dL 2.8 (L) 2.8 (L) 2.8 (L) 2.7 (L) 2.4 (L)  AST Latest Ref Range: 5-34 U/L 127 (H) 134 (H) 133 (H) 151 (H) 151 (H)  ALT Latest Ref Range: 0-55 U/L 48 51 50 47 44    No results found for: LABCA2  No components found for: EYCXK481  No results for input(s): INR in the last 168  hours.  Urinalysis No results found for: COLORURINE  STUDIES: No results found.  ASSESSMENT: 69 y.o. [employer] woman status post left breast and left axillary lymph node biopsy as well as right infraclavicular chest wall biopsy 10/23/2013 for a clinical T3, N2, M1, stage IV invasive ductal carcinoma, grade 2,  estrogen receptor 100% positive, progesterone receptor 23% positive, with an MIB-1 of 61%, and no HER-2 amplification.  (1) staging studies 11/05/2013 showed metastatic involvement of bone, liver, lungs, and brain  (2) antiestrogen therapy with letrozole and fulvestrant as well as targeted therapy with palbociclib started 11/11/2013, discontinued January 2015 with progression  (3) denosumab monthly started 11/11/2013  (4) s/p stereotactic radiosurgery to brain metastases 03/19/2014 - Left lateral cerebellar 4 mm to 20 Gy.   - Left medial cerebellar 3 mm to 20 Gy  (5) Right hydronephrosis w/o obstruction-- followed by urology ([facility])--resolved as of abd MRI 06/02/2014  (6) exemestane and everolimus started 06/07/2014, discontinued 11/10/2014 with progression  (7) s/p further stereotactic radiosurgery 07/05/2014  R parietal 3 mm target: 20 Gy  R parietal 2 mm target: 20 Gy  (8) anemia of CKD;  started darbepoietin 08/04/2014; dose increased 09/15/2014 and again 11/10/2014; ferritin checked regularly  (9) capecitabine 1.5 g BID 7 days on/ 7days off started 11/13/2014  PLAN: Asuzena's labs suggest some response to the capecitabine. However we are not going to be able to continue that medication in the short-term. She is having significant palmar plantar erythrodysesthesia. I expected will be anywhere from 3-6 weeks before we can restart the medication if that is going to be the plan.  The anasarca and ascites are going to be due partly to the low albumin and partly to the liver dysfunction. She has had a great deal of difficulty using the compression stockings. I  suggested she could try wrapping her legs with Ace bandages and that might be easier although they tend to get loose in and drop off if she is at all active. Today I did start her on Lasix 20 mg to be taken in the morning together with 20 mEq of potassium.  I think for comfort she should go ahead and have a paracentesis today. She understands this can cause fluid redistribution and that might lower her blood pressure and make her feel weak. If that develops we can always give her an albumin infusion.  Otherwise we are continuing the Aranesp every 2 weeks and the Xgeva monthly. She is going to see [place] in 2 weeks and then again in 4 weeks. At that time we will be able to decide whether to go back on the capecitabine or consider a different treatment for her peripheral disease. She will need a repeat brain MRI some time in early November  ADDENDUM: DC met results came in after I had seen the patient. They show a significant bump in her bilirubin. I am setting her up for an ultrasound to make sure we are not dealing with biliary obstruction. This all could be due to disease progression in which case we would have to reassess treatment plans for her systemic disease  Chauncey Cruel, MD

## 2015-03-02 NOTE — Procedures (Signed)
Successful US guided paracentesis from RLQ.  Yielded large amount of serous fluid.  No immediate complications.  Pt tolerated well.   Specimen was sent for labs.  Tsosie Billing D PA-C 03/02/2015 2:37 PM

## 2015-03-03 ENCOUNTER — Ambulatory Visit (HOSPITAL_COMMUNITY)
Admission: RE | Admit: 2015-03-03 | Discharge: 2015-03-03 | Disposition: A | Discharge status: Home / Self Care | Payer: Medicare Other | Source: Ambulatory Visit | Attending: Oncology | Admitting: Oncology

## 2015-03-03 ENCOUNTER — Ambulatory Visit (HOSPITAL_COMMUNITY): Payer: Medicare Other

## 2015-03-03 ENCOUNTER — Ambulatory Visit (HOSPITAL_COMMUNITY): Admission: RE | Admit: 2015-03-03 | Payer: Medicare Other | Source: Ambulatory Visit

## 2015-03-04 ENCOUNTER — Other Ambulatory Visit: Payer: Self-pay | Admitting: Oncology

## 2015-03-04 ENCOUNTER — Ambulatory Visit (HOSPITAL_COMMUNITY)
Admission: RE | Admit: 2015-03-04 | Discharge: 2015-03-04 | Disposition: A | Discharge status: Home / Self Care | Payer: Medicare Other | Source: Ambulatory Visit | Attending: Oncology | Admitting: Oncology

## 2015-03-04 DIAGNOSIS — R188 Other ascites: Secondary | ICD-10-CM | POA: Insufficient documentation

## 2015-03-04 DIAGNOSIS — J90 Pleural effusion, not elsewhere classified: Secondary | ICD-10-CM | POA: Diagnosis not present

## 2015-03-04 DIAGNOSIS — N1330 Unspecified hydronephrosis: Secondary | ICD-10-CM | POA: Insufficient documentation

## 2015-03-04 DIAGNOSIS — N261 Atrophy of kidney (terminal): Secondary | ICD-10-CM | POA: Insufficient documentation

## 2015-03-04 DIAGNOSIS — R160 Hepatomegaly, not elsewhere classified: Secondary | ICD-10-CM | POA: Diagnosis not present

## 2015-03-04 DIAGNOSIS — C50412 Malignant neoplasm of upper-outer quadrant of left female breast: Secondary | ICD-10-CM | POA: Insufficient documentation

## 2015-03-04 DIAGNOSIS — R932 Abnormal findings on diagnostic imaging of liver and biliary tract: Secondary | ICD-10-CM | POA: Insufficient documentation

## 2015-03-05 ENCOUNTER — Ambulatory Visit (HOSPITAL_COMMUNITY): Payer: Medicare Other

## 2015-03-07 ENCOUNTER — Other Ambulatory Visit (HOSPITAL_COMMUNITY): Payer: Medicare Other

## 2015-03-07 ENCOUNTER — Other Ambulatory Visit: Payer: Self-pay | Admitting: Oncology

## 2015-03-08 ENCOUNTER — Other Ambulatory Visit: Payer: Self-pay | Admitting: Oncology

## 2015-03-08 DIAGNOSIS — C50019 Malignant neoplasm of nipple and areola, unspecified female breast: Secondary | ICD-10-CM

## 2015-03-08 NOTE — Progress Notes (Unsigned)
I called Vicki Marshall with the results of her ultrasound, which don't really tell is all that much as to why her bilirubin suddenly took a jump (although it had been rising previously). It does show some thickening of the wall gallbladder wall but that doesn't really tell us what going on.  She is having significant palmar plantar erythrodysesthesia from the capecitabine, to the point that she cannot put on her compression stockings. She still has significant swelling issues and she is taking Lasix and potassium to try to relieve that. I reminded her also to restrict her sodium.  I'm going to see if we can schedule her for an MRI of the liver October 10 or 11th she sees as the 12th. At that point we can discuss where we are going to go regarding treatment since I don't think we will be able to push the capecitabine any further.

## 2015-03-11 ENCOUNTER — Ambulatory Visit (HOSPITAL_COMMUNITY)
Admission: RE | Admit: 2015-03-11 | Discharge: 2015-03-11 | Disposition: A | Discharge status: Home / Self Care | Payer: Medicare Other | Source: Ambulatory Visit | Attending: Oncology | Admitting: Oncology

## 2015-03-11 DIAGNOSIS — K769 Liver disease, unspecified: Secondary | ICD-10-CM | POA: Diagnosis not present

## 2015-03-11 DIAGNOSIS — C50019 Malignant neoplasm of nipple and areola, unspecified female breast: Secondary | ICD-10-CM | POA: Insufficient documentation

## 2015-03-11 DIAGNOSIS — N1330 Unspecified hydronephrosis: Secondary | ICD-10-CM | POA: Insufficient documentation

## 2015-03-11 DIAGNOSIS — R188 Other ascites: Secondary | ICD-10-CM | POA: Diagnosis not present

## 2015-03-13 ENCOUNTER — Other Ambulatory Visit: Payer: Self-pay | Admitting: Oncology

## 2015-03-13 NOTE — Progress Notes (Unsigned)
Vicki Marshall will see HB 10/12 to discuss results of her liver MRI. It shows progresswive cirrhosis, which we see in patients like her.  I think first we need to restage the brain. If that is stable we sill start abraxane (perhaps every 2 weeks) and continue the aranesp and denosumab. We also need to start optimzing tratment for her cirrhosis.  Will try to change appt to my schedule 10/12

## 2015-03-14 ENCOUNTER — Ambulatory Visit (HOSPITAL_COMMUNITY): Payer: Medicare Other

## 2015-03-14 MED ORDER — GADOBENATE DIMEGLUMINE 529 MG/ML IV SOLN
13.0000 mL | Freq: Once | INTRAVENOUS | Status: AC | PRN
  Administered 2015-03-11: 15 mL via INTRAVENOUS

## 2015-03-14 MED ORDER — GADOBENATE DIMEGLUMINE 529 MG/ML IV SOLN: 13.0000 mL | Freq: Once | INTRAVENOUS | Status: DC | PRN

## 2015-03-16 ENCOUNTER — Other Ambulatory Visit: Payer: Medicare Other

## 2015-03-16 ENCOUNTER — Ambulatory Visit: Payer: Medicare Other

## 2015-03-16 ENCOUNTER — Encounter: Payer: Self-pay | Admitting: Nurse Practitioner

## 2015-03-16 ENCOUNTER — Other Ambulatory Visit (HOSPITAL_BASED_OUTPATIENT_CLINIC_OR_DEPARTMENT_OTHER): Payer: Medicare Other

## 2015-03-16 ENCOUNTER — Ambulatory Visit (HOSPITAL_COMMUNITY)
Admission: RE | Admit: 2015-03-16 | Discharge: 2015-03-16 | Disposition: A | Discharge status: Home / Self Care | Payer: Medicare Other | Source: Ambulatory Visit | Attending: Nurse Practitioner | Admitting: Nurse Practitioner

## 2015-03-16 ENCOUNTER — Telehealth: Payer: Self-pay | Admitting: Nurse Practitioner

## 2015-03-16 ENCOUNTER — Ambulatory Visit (HOSPITAL_BASED_OUTPATIENT_CLINIC_OR_DEPARTMENT_OTHER): Payer: Medicare Other | Admitting: Nurse Practitioner

## 2015-03-16 VITALS — BP 101/62 | HR 99 | Temp 97.5°F | Resp 18 | Ht 66.0 in | Wt 140.2 lb

## 2015-03-16 DIAGNOSIS — N183 Chronic kidney disease, stage 3 unspecified: Secondary | ICD-10-CM

## 2015-03-16 DIAGNOSIS — C50412 Malignant neoplasm of upper-outer quadrant of left female breast: Secondary | ICD-10-CM | POA: Diagnosis present

## 2015-03-16 DIAGNOSIS — N1330 Unspecified hydronephrosis: Secondary | ICD-10-CM

## 2015-03-16 DIAGNOSIS — R180 Malignant ascites: Secondary | ICD-10-CM

## 2015-03-16 DIAGNOSIS — N189 Chronic kidney disease, unspecified: Secondary | ICD-10-CM

## 2015-03-16 DIAGNOSIS — C7931 Secondary malignant neoplasm of brain: Secondary | ICD-10-CM

## 2015-03-16 DIAGNOSIS — C7951 Secondary malignant neoplasm of bone: Secondary | ICD-10-CM | POA: Diagnosis not present

## 2015-03-16 DIAGNOSIS — C773 Secondary and unspecified malignant neoplasm of axilla and upper limb lymph nodes: Secondary | ICD-10-CM | POA: Diagnosis not present

## 2015-03-16 DIAGNOSIS — D631 Anemia in chronic kidney disease: Secondary | ICD-10-CM

## 2015-03-16 DIAGNOSIS — C7800 Secondary malignant neoplasm of unspecified lung: Secondary | ICD-10-CM | POA: Diagnosis not present

## 2015-03-16 DIAGNOSIS — C50919 Malignant neoplasm of unspecified site of unspecified female breast: Secondary | ICD-10-CM | POA: Diagnosis not present

## 2015-03-16 DIAGNOSIS — C7989 Secondary malignant neoplasm of other specified sites: Secondary | ICD-10-CM

## 2015-03-16 DIAGNOSIS — C787 Secondary malignant neoplasm of liver and intrahepatic bile duct: Secondary | ICD-10-CM

## 2015-03-16 DIAGNOSIS — R188 Other ascites: Secondary | ICD-10-CM

## 2015-03-16 DIAGNOSIS — C50912 Malignant neoplasm of unspecified site of left female breast: Secondary | ICD-10-CM

## 2015-03-16 DIAGNOSIS — D630 Anemia in neoplastic disease: Secondary | ICD-10-CM

## 2015-03-16 LAB — COMPREHENSIVE METABOLIC PANEL (CC13)
ALT: 56 U/L — AB (ref 0–55)
AST: 190 U/L — AB (ref 5–34)
Albumin: 1.9 g/dL — ABNORMAL LOW (ref 3.5–5.0)
Alkaline Phosphatase: 640 U/L — ABNORMAL HIGH (ref 40–150)
Anion Gap: 10 mEq/L (ref 3–11)
BILIRUBIN TOTAL: 6.02 mg/dL — AB (ref 0.20–1.20)
BUN: 36.1 mg/dL — AB (ref 7.0–26.0)
CHLORIDE: 106 meq/L (ref 98–109)
CO2: 16 meq/L — AB (ref 22–29)
CREATININE: 1.1 mg/dL (ref 0.6–1.1)
Calcium: 7.3 mg/dL — ABNORMAL LOW (ref 8.4–10.4)
EGFR: 50 mL/min/{1.73_m2} — ABNORMAL LOW (ref >90)
GLUCOSE: 96 mg/dL (ref 70–140)
Potassium: 5.3 mEq/L — ABNORMAL HIGH (ref 3.5–5.1)
SODIUM: 132 meq/L — AB (ref 136–145)
TOTAL PROTEIN: 5.8 g/dL — AB (ref 6.4–8.3)

## 2015-03-16 LAB — CBC & DIFF AND RETIC
BASO%: 0.2 % (ref 0.0–2.0)
Basophils Absolute: 0 10*3/uL (ref 0.0–0.1)
EOS%: 1.9 % (ref 0.0–7.0)
Eosinophils Absolute: 0.1 10*3/uL (ref 0.0–0.5)
HCT: 36.5 % (ref 34.8–46.6)
HGB: 11.7 g/dL (ref 11.6–15.9)
IMMATURE RETIC FRACT: 28.3 % — AB (ref 1.60–10.00)
LYMPH#: 0.7 10*3/uL — AB (ref 0.9–3.3)
LYMPH%: 13.2 % — ABNORMAL LOW (ref 14.0–49.7)
MCH: 37.5 pg — ABNORMAL HIGH (ref 25.1–34.0)
MCHC: 32.1 g/dL (ref 31.5–36.0)
MCV: 117 fL — ABNORMAL HIGH (ref 79.5–101.0)
MONO#: 0.7 10*3/uL (ref 0.1–0.9)
MONO%: 13.2 % (ref 0.0–14.0)
NEUT#: 3.9 10*3/uL (ref 1.5–6.5)
NEUT%: 71.5 % (ref 38.4–76.8)
PLATELETS: 117 10*3/uL — AB (ref 145–400)
RBC: 3.12 10*6/uL — AB (ref 3.70–5.45)
RDW: 23.3 % — AB (ref 11.2–14.5)
RETIC %: 5.48 % — AB (ref 0.70–2.10)
RETIC CT ABS: 170.98 10*3/uL — AB (ref 33.70–90.70)
WBC: 5.4 10*3/uL (ref 3.9–10.3)

## 2015-03-16 LAB — FERRITIN CHCC: Ferritin: 2334 ng/ml — ABNORMAL HIGH (ref 9–269)

## 2015-03-16 MED ORDER — POTASSIUM CHLORIDE 20 MEQ/15ML (10%) PO SOLN: 20.0000 meq | Freq: Two times a day (BID) | ORAL | Status: DC

## 2015-03-16 MED ORDER — SPIRONOLACTONE 50 MG PO TABS: 50.0000 mg | ORAL_TABLET | Freq: Every day | ORAL | Status: AC

## 2015-03-16 NOTE — Procedures (Addendum)
Ultrasound-guided therapeutic paracentesis performed yielding 7.2 liters slightly hazy, yellow fluid. No immediate complications. Patient noted to have palpable tender epigastric midline mass post large volume paracentesis most consistent with nodular liver by ultrasound. Dr. Anselm Pancoast was present during exam.

## 2015-03-16 NOTE — Progress Notes (Signed)
[facility]  [phone] [phone]     ID: [name] OB: 70-Jul-1947  MR#: [id number]  JSE#:[id number]  PCP: Lilian Coma, MD GYN:   SU: Excell Seltzer OTHER MD: Arloa Koh, Altamese Cabal, Scott Minor DDS, Tyler Pita, Franchot Gallo, Consuella Lose  CHIEF COMPLAINT: stage IV breast cancer, estrogen receptor positive  TREATMENT: [capecitabine], aranesp, denosumab  BREAST CANCER HISTORY: From the original intake note 11/04/2013:  Zaylin noted a change in her left breast approximately mid 2014. This was a flattening of the lateral aspect of the left breast. Eventually she noted an indentation and eventually a "sore" in that area. She had not shared this information with her husband, and she did not have a primary care physician. More recently, as she started to have symptoms related to this, she mentioned it to her husband [name] and she establish yourself with Dr. Stephanie Acre, who set her up for bilateral diagnostic mammography and ultrasonography at the [facility] 10/23/2013.. This showed a breast density to be category C. The right breast was negative.  The left breast contained an ulcerated mass laterally measuring at least 4 cm mammography. On physical exam there was an ulcerated mass in the 2:00 location of the left breast, associated with erythematous skin nodules in the upper inner quadrant. The left breast was smaller than the right, and firm. There were palpable left axillary lymph nodes. In addition, on the right upper chest wall, infraclavicular like, there was a firm nodule separate from the bony structures. There was also a right preauricular node and a firm left submandibular node.  Ultrasound showed multiple nodules throughout the lower portion of the left breast. There was a large mass in the upper outer quadrant of the left breast contiguous with the skin ulceration, measuring greater than 5 cm. The left axilla  showed at least 3 suspicious masses, the largest measuring 1.9 cm. Ultrasound of the palpable abnormality of the right chest wall showed a superficial irregular mass, subdermal, measuring 0.8 cm.  Biopsies of the left breast mass, a left axillary lymph node, and the right infra-clavicular mass on 10/23/2013, all showed ([id number]) and invasive ductal carcinoma, grade 1 or 2, estrogen receptor 100% positive, progesterone receptor 23% positive, both with strong staining intensity, with an MIB-1 of 61%, and no HER-2 amplification, the signals ratio being 0.90, and the number per cell 1.80.  On 11/02/2013 the patient underwent bilateral breast MRI and this showed, in the left breast, a 5.8 cm mass eroding through the overlying skin, and also invading the underlying pectoralis muscle and chest wall. There were multiple enhancing nodules throughout the left breast and an adjacent 4.8 cm irregular enhancing mass inferiorly. There are multiple large irregular left axillary lymph nodes, the largest measuring 2.4 cm. There was a 0.9 cm left subpectoral lymph node as well as prevascular and precarinal lymph nodes.  In the right breast far upper inner quadrant there was a 0.8 cm irregular enhancing mass abutting the overlying skin. This is the mass that was biopsied and shown to be positive. In addition there was patchy heterogeneous enhancement of the sternum.  The patient's subsequent history is as detailed below  INTERVAL HISTORY: Arva returns today for follow-up of her metastatic breast cancer, accompanied by her husband, [name]. She has continued to hold the xeloda since she was instructed at her last visit due to a plantar palmar rash. This is improving some but she still has the dry cracked fissures and peeling skin. She  has aquaphor but has not had much energy to keep up with with applying it. She complains of excessive fatigue. She is sure she has reaccumulated a large portion of her ascites. She is very  uncomfortable and feels bloated. Her appetite is down and she feels full quickly. She continues to wear compression hose on her legs, but the swelling to her ankles is better in the mornings after being elevated on pillows. She is short of breath with minimal exertion, even when talking. She is seated in a wheelchair today. She denies pain. She is moving her bowels well.  REVIEW OF SYSTEMS: A detailed review of systems is otherwise stable, except where noted above.  PAST MEDICAL HISTORY: Past Medical History  Diagnosis Date  . Urinary tract infection   . Anemia   . Breast cancer     T3N2M1 stage IV invasive ductal carcinoma   . Brain metastases   . Hydronephrosis, left   . Lung metastases   . Bone metastases     PAST SURGICAL HISTORY: Past Surgical History  Procedure Laterality Date  . Cataract extraction  2013  . Mouth surgery  1981    FAMILY HISTORY: The patient's father died at the age of 61, from pneumonia. The patient's mother lived to be 53. The patient had no brothers, one sister. There is no history of breast or ovarian cancer in the family  GYNECOLOGIC HISTORY:  Menarche age 38. The patient is GX P0. She stopped having periods approximately 2003. She did not take hormone replacement. She used birth control pills for approximately 8 years remotely, with no complications   SOCIAL HISTORY:  Averi retired about a year ago. 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 Her husband [name] is retired from working in Charity fundraiser. They live alone, with no pets.    ADVANCED DIRECTIVES: Not in place   HEALTH MAINTENANCE: Social History  Substance Use Topics  . Smoking status: Never Smoker   . Smokeless tobacco: Never Used  . Alcohol Use: Yes     Comment: 10 12 glasses/week     Colonoscopy: Never  PAP: 1990  Bone density: Never  Lipid panel:  No Known Allergies  Current Outpatient Prescriptions  Medication Sig Dispense Refill  . calcium  carbonate (TUMS - DOSED IN MG ELEMENTAL CALCIUM) 500 MG chewable tablet Chew 1 tablet by mouth daily.    . darbepoetin (ARANESP) 100 MCG/0.5ML SOLN injection Inject 500 mcg into the skin.     Marland Kitchen denosumab (XGEVA) 120 MG/1.7ML SOLN injection Inject 120 mg into the skin every 28 (twenty-eight) days.    . furosemide (LASIX) 20 MG tablet Take 1 tablet (20 mg total) by mouth daily. 30 tablet 12  . ALPRAZolam (XANAX) 0.5 MG tablet Take 1 tablet (0.5 mg total) by mouth at bedtime as needed for anxiety. (Patient not taking: Reported on 08/09/2014) 10 tablet 0  . capecitabine (XELODA) 500 MG tablet Take 3 tablets (1,500 mg total) by mouth 2 (two) times daily after a meal. (Patient not taking: Reported on 03/16/2015) 84 tablet 6  . spironolactone (ALDACTONE) 50 MG tablet Take 1 tablet (50 mg total) by mouth daily. 30 tablet 5   No current facility-administered medications for this visit.    OBJECTIVE: Middle-aged white woman  Filed Vitals:   03/16/15 1351  BP: 101/62  Pulse: 99  Temp: 97.5 F (36.4 C)  Resp: 18     Body mass index is 22.64 kg/(m^2).    ECOG FS:2 -  Symptomatic, <50% confined to bed  Skin: palms hyperpigmented, excessively dry and peeling.  HEENT: sclerae anicteric, conjunctivae pink, oropharynx clear. No thrush or mucositis.  Lymph Nodes: No cervical or supraclavicular lymphadenopathy  Lungs: clear to auscultation bilaterally, no rales, wheezes, or rhonci  Heart: regular rate and rhythm  Abdomen: grossly distended, soft, non tender, positive bowel sounds  Musculoskeletal: No focal spinal tenderness, grade 2 bilateral lower extremity edema. Compression stockings on Neuro: non focal, well oriented, appropriate affect  Breasts: deferred   LAB RESULTS:  CMP     Component Value Date/Time   NA 132* 03/16/2015 1322   NA 134* 09/15/2014 1449   K 5.3* 03/16/2015 1322   K 4.0 09/15/2014 1449   CL 101 09/15/2014 1449   CO2 16* 03/16/2015 1322   CO2 22 09/15/2014 1449   GLUCOSE 96  03/16/2015 1322   GLUCOSE 130* 09/15/2014 1449   BUN 36.1* 03/16/2015 1322   BUN 17 09/15/2014 1449   CREATININE 1.1 03/16/2015 1322   CREATININE 0.93 09/15/2014 1449   CALCIUM 7.3* 03/16/2015 1322   CALCIUM 8.9 09/15/2014 1449   PROT 5.8* 03/16/2015 1322   PROT 6.6 09/15/2014 1449   ALBUMIN 1.9* 03/16/2015 1322   ALBUMIN 3.4* 09/15/2014 1449   AST 190* 03/16/2015 1322   AST 83* 09/15/2014 1449   ALT 56* 03/16/2015 1322   ALT 41* 09/15/2014 1449   ALKPHOS 640* 03/16/2015 1322   ALKPHOS 238* 09/15/2014 1449   BILITOT 6.02* 03/16/2015 1322   BILITOT 0.6 09/15/2014 1449    I No results found for: SPEP  Lab Results  Component Value Date   WBC 5.4 03/16/2015   NEUTROABS 3.9 03/16/2015   HGB 11.7 03/16/2015   HCT 36.5 03/16/2015   MCV 117.0* 03/16/2015   PLT 117* 03/16/2015      Chemistry      Component Value Date/Time   NA 132* 03/16/2015 1322   NA 134* 09/15/2014 1449   K 5.3* 03/16/2015 1322   K 4.0 09/15/2014 1449   CL 101 09/15/2014 1449   CO2 16* 03/16/2015 1322   CO2 22 09/15/2014 1449   BUN 36.1* 03/16/2015 1322   BUN 17 09/15/2014 1449   CREATININE 1.1 03/16/2015 1322   CREATININE 0.93 09/15/2014 1449      Component Value Date/Time   CALCIUM 7.3* 03/16/2015 1322   CALCIUM 8.9 09/15/2014 1449   ALKPHOS 640* 03/16/2015 1322   ALKPHOS 238* 09/15/2014 1449   AST 190* 03/16/2015 1322   AST 83* 09/15/2014 1449   ALT 56* 03/16/2015 1322   ALT 41* 09/15/2014 1449   BILITOT 6.02* 03/16/2015 1322   BILITOT 0.6 09/15/2014 1449    Results for JAYLINN, HELLENBRAND (MRN [id number]) as of 03/16/2015 16:52  Ref. Range 01/05/2015 13:39 01/19/2015 12:35 02/02/2015 13:10 02/16/2015 14:08 03/02/2015 11:26 03/16/2015 13:22  Alkaline Phosphatase Latest Ref Range: 40-150 U/L 513 (H) 475 (H) 499 (H) 462 (H) 450 (H) 640 (H)  Albumin Latest Ref Range: 3.5-5.0 g/dL 2.8 (L) 2.8 (L) 2.7 (L) 2.4 (L) 2.2 (L) 1.9 (L)  AST Latest Ref Range: 5-34 U/L 134 (H) 133 (H) 151 (H) 151 (H) 171 (HH)  190 (HH)  ALT Latest Ref Range: 0-55 U/L 51 50 47 44 45 56 (H)  Total Protein Latest Ref Range: 6.4-8.3 g/dL 6.4 6.1 (L) 6.2 (L) 5.9 (L) 5.6 (L) 5.8 (L)  Total Bilirubin Latest Ref Range: 0.20-1.20 mg/dL 2.08 (H) 2.11 (H) 2.36 (H) 2.86 (H) 4.07 (HH) 6.02 (HH)  No results found for: LABCA2  No components found for: [id number]  No results for input(s): INR in the last 168 hours.  Urinalysis No results found for: COLORURINE  STUDIES: US Abdomen Complete  03/04/2015  CLINICAL DATA:  Elevated bili Rubin, evaluate for biliary obstruction, ascites, history of breast malignancy. EXAM: ULTRASOUND ABDOMEN COMPLETE COMPARISON:  MRI of the liver of January 10, 2015 FINDINGS: Gallbladder: The gallbladder is mildly distended and contains echogenic material in a fashion that consists that suggests sludge or inspissated bile. Discrete stones are not observed. The gallbladder wall is thickened to at least 3.8 mm. There is adjacent ascitic fluid. There is no positive sonographic Murphy's sign. Common bile duct: Diameter: Not visualized Liver: The hepatic echotexture is markedly heterogeneous. Numerous hypoechoic masses are observed. No intrahepatic ductal dilation is demonstrated. IVC: No abnormality visualized. Pancreas: Visualized portion unremarkable. Spleen: Size and appearance within normal limits. Right Kidney: Length: 7.8 cm. The kidney is atrophic with marked cortical thinning and severe hydronephrosis. Left Kidney: Length: 10.4 cm. The renal cortical echotexture is mildly increased. There is no hydronephrosis. Abdominal aorta: No aneurysm visualized. Other findings: There is moderate ascites. There is a left pleural effusion. IMPRESSION: 1. Abnormal appearance of the gallbladder with echogenic bile versus sludge and mild wall thickening. There is no sonographic Murphy sign. 2. Nonvisualization of the common bile duct. There is no intrahepatic ductal dilation. The echotexture of the liver is markedly abnormal  with multiple hypoechoic masses present consistent with metastatic disease. 3. Atrophic right kidney with marked hydronephrosis. 4. Moderate ascites, left pleural effusion. Electronically Signed   By: David  Martinique M.D.   On: 03/04/2015 13:59   Mr Liver W Wo Contrast  03/11/2015  CLINICAL DATA:  69 year old female with metastatic breast cancer. EXAM: MRI ABDOMEN WITHOUT AND WITH CONTRAST TECHNIQUE: Multiplanar multisequence MR imaging of the abdomen was performed both before and after the administration of intravenous contrast. CONTRAST:  13 cc MultiHance. COMPARISON:  01/10/2015.  11/04/2014. FINDINGS: Lower chest: Bilateral pleural effusions noted, right greater than left. Previous described left lower lobe pulmonary nodule not evident today . Hepatobiliary: The liver has decreased in size in the interval. It is become markedly heterogeneous and nodular in configuration. Direct comparison of lesions on today's study to the previous exams is difficult. 2.5 cm right hepatic lesion seen on image 36 series 1502 probably corresponds to the 3.1 cm lesion seen on the most recent comparison study. 2.1 cm lesion in the posterior aspect of the subcapsular right liver (image 25 series 1502) is likely the 2.7 cm lesion seen previously. 2.4 cm lesion in the dome (image 16) was 2.4 cm previously. Gallbladder not clearly visualized. No evidence for intra or extrahepatic biliary duct dilatation. Pancreas: No enhancing pancreatic lesion. No dilatation of the main duct. Spleen: No splenomegaly. No focal mass lesion. Adrenals/Urinary Tract: No adrenal nodule or mass. Left kidney is unremarkable. Chronic right hydronephrosis with overlying cortical thinning again noted. Stomach/Bowel: No evidence for bowel obstruction. Vascular/Lymphatic: No abdominal aortic aneurysm. No abdominal lymphadenopathy. Other: Large volume ascites on the current study has progressed in the interval. Musculoskeletal: No abnormal marrow enhancement within  the visualized bony anatomy. IMPRESSION: The liver has become much smaller in size in the interval and is markedly irregular in contour on today's study. Those lesions which can be identified as discrete on the current study measure slightly smaller than on the most recent comparison study. No evidence for intra or extrahepatic biliary duct dilatation. Interval progression of ascites, now moderate  to large in character. Chronic right hydronephrosis. Electronically Signed   By: Misty Stanley M.D.   On: 03/11/2015 15:48   US Paracentesis  03/02/2015  INDICATION: Abdominal distention. Ascites. Request for diagnostic and therapeutic paracentesis. EXAM: ULTRASOUND-GUIDED PARACENTESIS COMPARISON:  None. MEDICATIONS: None. COMPLICATIONS: None immediate TECHNIQUE: Informed written consent was obtained from the patient after a discussion of the risks, benefits and alternatives to treatment. A timeout was performed prior to the initiation of the procedure. Initial ultrasound scanning demonstrates a large amount of ascites within the right lower abdominal quadrant. The right lower abdomen was prepped and draped in the usual sterile fashion. 1% lidocaine with epinephrine was used for local anesthesia. Under direct ultrasound guidance, a 19 gauge, 7-cm, Yueh catheter was introduced. An ultrasound image was saved for documentation purposed. The paracentesis was performed. The catheter was removed and a dressing was applied. The patient tolerated the procedure well without immediate post procedural complication. FINDINGS: A total of approximately 4.8 liters of serous fluid was removed. Samples were sent to the laboratory as requested by the clinical team. IMPRESSION: Successful ultrasound-guided paracentesis yielding 4.8 liters of peritoneal fluid. Read By: Tsosie Billing PA-C Electronically Signed   By: Markus Daft M.D.   On: 03/02/2015 15:20    ASSESSMENT: 69 y.o. [place] woman status post left breast and left axillary  lymph node biopsy as well as right infraclavicular chest wall biopsy 10/23/2013 for a clinical T3, N2, M1, stage IV invasive ductal carcinoma, grade 2, estrogen receptor 100% positive, progesterone receptor 23% positive, with an MIB-1 of 61%, and no HER-2 amplification.  (1) staging studies 11/05/2013 showed metastatic involvement of bone, liver, lungs, and brain  (2) antiestrogen therapy with letrozole and fulvestrant as well as targeted therapy with palbociclib started 11/11/2013, discontinued January 2015 with progression  (3) denosumab monthly started 11/11/2013  (4) s/p stereotactic radiosurgery to brain metastases 03/19/2014 - Left lateral cerebellar 4 mm to 20 Gy.   - Left medial cerebellar 3 mm to 20 Gy  (5) Right hydronephrosis w/o obstruction-- followed by urology ([facility]t)--resolved as of abd MRI 06/02/2014  (6) exemestane and everolimus started 06/07/2014, discontinued 11/10/2014 with progression  (7) s/p further stereotactic radiosurgery 07/05/2014  R parietal 3 mm target: 20 Gy  R parietal 2 mm target: 20 Gy  (8) anemia of CKD;  started darbepoietin 08/04/2014; dose increased 09/15/2014 and again 11/10/2014; ferritin checked regularly  (9) capecitabine 1.5 g BID 7 days on/ 7days off started 11/13/2014  PLAN: Simrah is feeling poorly today, and her ascites has clearly reaccumulated. Dr. Jana Hakim was present for the visit, and we will initiate a paracentesis today. She may require these weekly or bimonthly moving forward. If so, we will move for a more permanent draining system so she does not have to be stuck so frequently. The labs were reviewed in detail and her bilirubin is up to 6.02 today. Her alk phos, AST and ALT are trending upwards. We are going to continue to hold the capecitabine until her palmar peeling has improved some more.   We are going to start diuretic therapy as well. She is already on 55m lasix daily. We are adding 580mspironolactone daily, and  she will stop her oral potassium supplements. Her potassium is already 5.3 today and spironolactone is potassium sparing.   Her hgb is normal today, so we will hold the aranesp. She will next be due for xgeva in 2 weeks.  I have ordered a brain MRI to be performed this week. CaHoyle Sauer  will return next Monday for a follow up visit with Dr. Jana Hakim.   Laurie Panda, NP

## 2015-03-16 NOTE — Telephone Encounter (Signed)
Appointments made and avs pritned for patient °

## 2015-03-20 ENCOUNTER — Emergency Department (HOSPITAL_COMMUNITY): Payer: Medicare Other

## 2015-03-20 ENCOUNTER — Encounter (HOSPITAL_COMMUNITY): Payer: Self-pay | Admitting: *Deleted

## 2015-03-20 ENCOUNTER — Inpatient Hospital Stay (HOSPITAL_COMMUNITY)
Admission: EM | Admit: 2015-03-20 | Discharge: 2015-04-05 | DRG: 640 | Disposition: E | Payer: Medicare Other | Attending: Pulmonary Disease | Admitting: Pulmonary Disease

## 2015-03-20 ENCOUNTER — Inpatient Hospital Stay (HOSPITAL_COMMUNITY): Payer: Medicare Other

## 2015-03-20 DIAGNOSIS — Z515 Encounter for palliative care: Secondary | ICD-10-CM | POA: Diagnosis not present

## 2015-03-20 DIAGNOSIS — K7460 Unspecified cirrhosis of liver: Secondary | ICD-10-CM | POA: Diagnosis present

## 2015-03-20 DIAGNOSIS — B9620 Unspecified Escherichia coli [E. coli] as the cause of diseases classified elsewhere: Secondary | ICD-10-CM | POA: Diagnosis present

## 2015-03-20 DIAGNOSIS — C7931 Secondary malignant neoplasm of brain: Secondary | ICD-10-CM | POA: Diagnosis present

## 2015-03-20 DIAGNOSIS — E162 Hypoglycemia, unspecified: Secondary | ICD-10-CM | POA: Diagnosis present

## 2015-03-20 DIAGNOSIS — D649 Anemia, unspecified: Secondary | ICD-10-CM | POA: Diagnosis present

## 2015-03-20 DIAGNOSIS — E872 Acidosis: Secondary | ICD-10-CM

## 2015-03-20 DIAGNOSIS — R4182 Altered mental status, unspecified: Secondary | ICD-10-CM

## 2015-03-20 DIAGNOSIS — N179 Acute kidney failure, unspecified: Secondary | ICD-10-CM | POA: Diagnosis not present

## 2015-03-20 DIAGNOSIS — A4151 Sepsis due to Escherichia coli [E. coli]: Secondary | ICD-10-CM | POA: Diagnosis present

## 2015-03-20 DIAGNOSIS — R64 Cachexia: Secondary | ICD-10-CM | POA: Diagnosis present

## 2015-03-20 DIAGNOSIS — Z79899 Other long term (current) drug therapy: Secondary | ICD-10-CM | POA: Diagnosis not present

## 2015-03-20 DIAGNOSIS — N390 Urinary tract infection, site not specified: Secondary | ICD-10-CM | POA: Diagnosis present

## 2015-03-20 DIAGNOSIS — Z66 Do not resuscitate: Secondary | ICD-10-CM | POA: Diagnosis not present

## 2015-03-20 DIAGNOSIS — J690 Pneumonitis due to inhalation of food and vomit: Secondary | ICD-10-CM | POA: Diagnosis present

## 2015-03-20 DIAGNOSIS — J9600 Acute respiratory failure, unspecified whether with hypoxia or hypercapnia: Secondary | ICD-10-CM | POA: Diagnosis present

## 2015-03-20 DIAGNOSIS — D689 Coagulation defect, unspecified: Secondary | ICD-10-CM | POA: Diagnosis present

## 2015-03-20 DIAGNOSIS — R188 Other ascites: Secondary | ICD-10-CM | POA: Diagnosis present

## 2015-03-20 DIAGNOSIS — R739 Hyperglycemia, unspecified: Secondary | ICD-10-CM | POA: Diagnosis not present

## 2015-03-20 DIAGNOSIS — C7951 Secondary malignant neoplasm of bone: Secondary | ICD-10-CM | POA: Diagnosis present

## 2015-03-20 DIAGNOSIS — R6521 Severe sepsis with septic shock: Secondary | ICD-10-CM | POA: Diagnosis present

## 2015-03-20 DIAGNOSIS — I469 Cardiac arrest, cause unspecified: Secondary | ICD-10-CM | POA: Diagnosis not present

## 2015-03-20 DIAGNOSIS — I468 Cardiac arrest due to other underlying condition: Secondary | ICD-10-CM | POA: Diagnosis present

## 2015-03-20 DIAGNOSIS — Z809 Family history of malignant neoplasm, unspecified: Secondary | ICD-10-CM

## 2015-03-20 DIAGNOSIS — Z853 Personal history of malignant neoplasm of breast: Secondary | ICD-10-CM

## 2015-03-20 DIAGNOSIS — C7802 Secondary malignant neoplasm of left lung: Secondary | ICD-10-CM | POA: Diagnosis present

## 2015-03-20 DIAGNOSIS — R001 Bradycardia, unspecified: Secondary | ICD-10-CM | POA: Diagnosis present

## 2015-03-20 DIAGNOSIS — E875 Hyperkalemia: Secondary | ICD-10-CM | POA: Diagnosis present

## 2015-03-20 LAB — I-STAT ARTERIAL BLOOD GAS, ED
ACID-BASE DEFICIT: 22 mmol/L — AB (ref 0.0–2.0)
Acid-base deficit: 22 mmol/L — ABNORMAL HIGH (ref 0.0–2.0)
BICARBONATE: 9.5 meq/L — AB (ref 20.0–24.0)
Bicarbonate: 9.6 mEq/L — ABNORMAL LOW (ref 20.0–24.0)
O2 Saturation: 98 %
O2 Saturation: 85 %
PCO2 ART: 42.8 mmHg (ref 35.0–45.0)
PO2 ART: 162 mmHg — AB (ref 80.0–100.0)
PO2 ART: 82 mmHg (ref 80.0–100.0)
Patient temperature: 98.6
Patient temperature: 98.6
TCO2: 11 mmol/L (ref 0–100)
TCO2: 11 mmol/L (ref 0–100)
pCO2 arterial: 48.2 mmHg — ABNORMAL HIGH (ref 35.0–45.0)
pH, Arterial: 6.955 — CL (ref 7.350–7.450)
pH, Arterial: 6.907 — CL (ref 7.350–7.450)

## 2015-03-20 LAB — URINALYSIS, ROUTINE W REFLEX MICROSCOPIC
GLUCOSE, UA: NEGATIVE mg/dL
Hgb urine dipstick: NEGATIVE
Ketones, ur: 15 mg/dL — AB
Nitrite: NEGATIVE
PROTEIN: 30 mg/dL — AB
Specific Gravity, Urine: 1.012 (ref 1.005–1.030)
Urobilinogen, UA: 1 mg/dL (ref 0.0–1.0)
pH: 5 (ref 5.0–8.0)

## 2015-03-20 LAB — BASIC METABOLIC PANEL
ANION GAP: 13 (ref 5–15)
BUN: 67 mg/dL — ABNORMAL HIGH (ref 6–20)
CHLORIDE: 101 mmol/L (ref 101–111)
CO2: 15 mmol/L — AB (ref 22–32)
Calcium: 6.4 mg/dL — CL (ref 8.9–10.3)
Creatinine, Ser: 2.72 mg/dL — ABNORMAL HIGH (ref 0.44–1.00)
GFR calc non Af Amer: 17 mL/min — ABNORMAL LOW (ref >60)
GFR, EST AFRICAN AMERICAN: 19 mL/min — AB (ref >60)
Glucose, Bld: 358 mg/dL — ABNORMAL HIGH (ref 65–99)
POTASSIUM: 6.1 mmol/L — AB (ref 3.5–5.1)
SODIUM: 129 mmol/L — AB (ref 135–145)

## 2015-03-20 LAB — PROTIME-INR
INR: 3.98 — AB (ref 0.00–1.49)
INR: 4.74 — AB (ref 0.00–1.49)
PROTHROMBIN TIME: 43.2 s — AB (ref 11.6–15.2)
Prothrombin Time: 37.9 seconds — ABNORMAL HIGH (ref 11.6–15.2)

## 2015-03-20 LAB — CBC WITH DIFFERENTIAL/PLATELET
BASOS ABS: 0.1 10*3/uL (ref 0.0–0.1)
BASOS PCT: 1 %
Eosinophils Absolute: 0 10*3/uL (ref 0.0–0.7)
Eosinophils Relative: 0 %
HEMATOCRIT: 30.6 % — AB (ref 36.0–46.0)
HEMOGLOBIN: 9.2 g/dL — AB (ref 12.0–15.0)
LYMPHS ABS: 1.6 10*3/uL (ref 0.7–4.0)
Lymphocytes Relative: 26 %
MCH: 36.4 pg — AB (ref 26.0–34.0)
MCHC: 30.1 g/dL (ref 30.0–36.0)
MCV: 120.9 fL — AB (ref 78.0–100.0)
MONOS PCT: 5 %
Monocytes Absolute: 0.3 10*3/uL (ref 0.1–1.0)
NEUTROS ABS: 4.2 10*3/uL (ref 1.7–7.7)
Neutrophils Relative %: 68 %
Platelets: 78 10*3/uL — ABNORMAL LOW (ref 150–400)
RBC: 2.53 MIL/uL — ABNORMAL LOW (ref 3.87–5.11)
RDW: 23.7 % — ABNORMAL HIGH (ref 11.5–15.5)
WBC: 6.2 10*3/uL (ref 4.0–10.5)

## 2015-03-20 LAB — CBG MONITORING, ED
GLUCOSE-CAPILLARY: 190 mg/dL — AB (ref 65–99)
GLUCOSE-CAPILLARY: 57 mg/dL — AB (ref 65–99)
GLUCOSE-CAPILLARY: 219 mg/dL — AB (ref 65–99)
GLUCOSE-CAPILLARY: 227 mg/dL — AB (ref 65–99)
Glucose-Capillary: 186 mg/dL — ABNORMAL HIGH (ref 65–99)

## 2015-03-20 LAB — I-STAT CHEM 8, ED
BUN: 79 mg/dL — AB (ref 6–20)
BUN: 86 mg/dL — ABNORMAL HIGH (ref 6–20)
CALCIUM ION: 0.8 mmol/L — AB (ref 1.13–1.30)
CHLORIDE: 111 mmol/L (ref 101–111)
CREATININE: 2.9 mg/dL — AB (ref 0.44–1.00)
Calcium, Ion: 1.83 mmol/L (ref 1.13–1.30)
Chloride: 110 mmol/L (ref 101–111)
Creatinine, Ser: 3 mg/dL — ABNORMAL HIGH (ref 0.44–1.00)
GLUCOSE: 198 mg/dL — AB (ref 65–99)
Glucose, Bld: 488 mg/dL — ABNORMAL HIGH (ref 65–99)
HCT: 38 % (ref 36.0–46.0)
HEMATOCRIT: 32 % — AB (ref 36.0–46.0)
HEMOGLOBIN: 12.9 g/dL (ref 12.0–15.0)
Hemoglobin: 10.9 g/dL — ABNORMAL LOW (ref 12.0–15.0)
POTASSIUM: 7.5 mmol/L — AB (ref 3.5–5.1)
Potassium: 7.6 mmol/L (ref 3.5–5.1)
SODIUM: 131 mmol/L — AB (ref 135–145)
Sodium: 128 mmol/L — ABNORMAL LOW (ref 135–145)
TCO2: 11 mmol/L (ref 0–100)
TCO2: 10 mmol/L (ref 0–100)

## 2015-03-20 LAB — COMPREHENSIVE METABOLIC PANEL
ALT: 170 U/L — AB (ref 14–54)
AST: 1264 U/L — AB (ref 15–41)
Albumin: 1 g/dL — ABNORMAL LOW (ref 3.5–5.0)
Alkaline Phosphatase: 410 U/L — ABNORMAL HIGH (ref 38–126)
Anion gap: 8 (ref 5–15)
BUN: 77 mg/dL — AB (ref 6–20)
CHLORIDE: 109 mmol/L (ref 101–111)
CO2: 11 mmol/L — AB (ref 22–32)
CREATININE: 2.98 mg/dL — AB (ref 0.44–1.00)
Calcium: 12 mg/dL — ABNORMAL HIGH (ref 8.9–10.3)
GFR, EST AFRICAN AMERICAN: 17 mL/min — AB (ref >60)
GFR, EST NON AFRICAN AMERICAN: 15 mL/min — AB (ref >60)
Glucose, Bld: 500 mg/dL — ABNORMAL HIGH (ref 65–99)
SODIUM: 128 mmol/L — AB (ref 135–145)
Total Bilirubin: 4.7 mg/dL — ABNORMAL HIGH (ref 0.3–1.2)
Total Protein: 3.6 g/dL — ABNORMAL LOW (ref 6.5–8.1)

## 2015-03-20 LAB — I-STAT TROPONIN, ED: Troponin i, poc: 0.08 ng/mL (ref 0.00–0.08)

## 2015-03-20 LAB — MRSA PCR SCREENING: MRSA BY PCR: NEGATIVE

## 2015-03-20 LAB — I-STAT CG4 LACTIC ACID, ED
LACTIC ACID, VENOUS: 8.52 mmol/L — AB (ref 0.5–2.0)
Lactic Acid, Venous: 8.61 mmol/L (ref 0.5–2.0)

## 2015-03-20 LAB — APTT: APTT: 66 s — AB (ref 24–37)

## 2015-03-20 LAB — LACTIC ACID, PLASMA: LACTIC ACID, VENOUS: 9.9 mmol/L — AB (ref 0.5–2.0)

## 2015-03-20 LAB — URINE MICROSCOPIC-ADD ON

## 2015-03-20 LAB — AMMONIA: AMMONIA: 194 umol/L — AB (ref 9–35)

## 2015-03-20 MED ORDER — ANTISEPTIC ORAL RINSE SOLUTION (CORINZ): 7.0000 mL | Freq: Four times a day (QID) | OROMUCOSAL | Status: DC

## 2015-03-20 MED ORDER — SODIUM CHLORIDE 0.9 % IV SOLN: 250.0000 mL | INTRAVENOUS | Status: DC | PRN

## 2015-03-20 MED ORDER — LIDOCAINE-EPINEPHRINE (PF) 2 %-1:200000 IJ SOLN
20.0000 mL | Freq: Once | INTRAMUSCULAR | Status: AC
  Administered 2015-03-20: 20 mL
  Filled 2015-03-20: qty 20

## 2015-03-20 MED ORDER — VITAMIN K1 10 MG/ML IJ SOLN
10.0000 mg | Freq: Once | INTRAVENOUS | Status: AC
  Administered 2015-03-20: 10 mg via INTRAVENOUS
  Filled 2015-03-20: qty 1

## 2015-03-20 MED ORDER — DEXTROSE 5 % IV SOLN
INTRAVENOUS | Status: DC
Start: 2015-03-20 — End: 2015-03-21
  Administered 2015-03-20: 17:00:00 via INTRAVENOUS
  Filled 2015-03-20 (×3): qty 150

## 2015-03-20 MED ORDER — NOREPINEPHRINE BITARTRATE 1 MG/ML IV SOLN
0.0000 ug/min | Freq: Once | INTRAVENOUS | Status: AC
  Administered 2015-03-20: 2 ug/min via INTRAVENOUS

## 2015-03-20 MED ORDER — VANCOMYCIN HCL 10 G IV SOLR
1250.0000 mg | Freq: Once | INTRAVENOUS | Status: AC
  Administered 2015-03-20: 1250 mg via INTRAVENOUS
  Filled 2015-03-20: qty 1250

## 2015-03-20 MED ORDER — PANTOPRAZOLE SODIUM 40 MG IV SOLR: 40.0000 mg | Freq: Every day | INTRAVENOUS | Status: DC

## 2015-03-20 MED ORDER — SODIUM CHLORIDE 0.9 % IV SOLN
INTRAVENOUS | Status: AC | PRN
  Administered 2015-03-20: 1000 mL via INTRAVENOUS

## 2015-03-20 MED ORDER — SODIUM CHLORIDE 0.9 % IV SOLN
INTRAVENOUS | Status: DC
  Administered 2015-03-20: 17:00:00 via INTRAVENOUS

## 2015-03-20 MED ORDER — INSULIN ASPART 100 UNIT/ML IV SOLN
10.0000 [IU] | Freq: Once | INTRAVENOUS | Status: AC
  Administered 2015-03-20: 10 [IU] via INTRAVENOUS
  Filled 2015-03-20: qty 1

## 2015-03-20 MED ORDER — CALCIUM CHLORIDE 10 % IV SOLN
INTRAVENOUS | Status: AC | PRN
  Administered 2015-03-20: 1 g via INTRAVENOUS

## 2015-03-20 MED ORDER — DEXTROSE 10 % IV SOLN
Freq: Once | INTRAVENOUS | Status: AC
  Administered 2015-03-20: 15:00:00 via INTRAVENOUS

## 2015-03-20 MED ORDER — DEXTROSE 5 % IV SOLN
5.0000 ug/min | INTRAVENOUS | Status: DC
  Administered 2015-03-20: 20 ug/min via INTRAVENOUS
  Administered 2015-03-20: 25 ug/min via INTRAVENOUS
  Filled 2015-03-20 (×2): qty 4

## 2015-03-20 MED ORDER — SODIUM CHLORIDE 0.9 % IV BOLUS (SEPSIS): 500.0000 mL | Freq: Once | INTRAVENOUS | Status: AC

## 2015-03-20 MED ORDER — FENTANYL CITRATE (PF) 100 MCG/2ML IJ SOLN
INTRAMUSCULAR | Status: AC
  Filled 2015-03-20: qty 2

## 2015-03-20 MED ORDER — MIDAZOLAM HCL 2 MG/2ML IJ SOLN
INTRAMUSCULAR | Status: AC
  Filled 2015-03-20: qty 2

## 2015-03-20 MED ORDER — PIPERACILLIN-TAZOBACTAM 3.375 G IVPB
3.3750 g | Freq: Once | INTRAVENOUS | Status: AC
  Administered 2015-03-20: 3.375 g via INTRAVENOUS
  Filled 2015-03-20: qty 50

## 2015-03-20 MED ORDER — DEXTROSE 50 % IV SOLN
INTRAVENOUS | Status: AC | PRN
  Administered 2015-03-20: 50 mL via INTRAVENOUS

## 2015-03-20 MED ORDER — PROPOFOL 1000 MG/100ML IV EMUL
INTRAVENOUS | Status: AC
  Filled 2015-03-20: qty 100

## 2015-03-20 MED ORDER — MORPHINE SULFATE 25 MG/ML IV SOLN
10.0000 mg/h | INTRAVENOUS | Status: DC
  Administered 2015-03-20: 10 mg/h via INTRAVENOUS
  Filled 2015-03-20: qty 10

## 2015-03-20 MED ORDER — FENTANYL CITRATE (PF) 100 MCG/2ML IJ SOLN
INTRAMUSCULAR | Status: AC | PRN
  Administered 2015-03-20: 25 ug via INTRAVENOUS

## 2015-03-20 MED ORDER — DEXTROSE 50 % IV SOLN
1.0000 | Freq: Once | INTRAVENOUS | Status: DC
  Filled 2015-03-20: qty 50

## 2015-03-20 MED ORDER — VANCOMYCIN HCL IN DEXTROSE 750-5 MG/150ML-% IV SOLN: 750.0000 mg | INTRAVENOUS | Status: DC

## 2015-03-20 MED ORDER — SODIUM POLYSTYRENE SULFONATE 15 GM/60ML PO SUSP: 30.0000 g | Freq: Once | ORAL | Status: DC

## 2015-03-20 MED ORDER — DEXTROSE 50 % IV SOLN
INTRAVENOUS | Status: AC
  Filled 2015-03-20: qty 50

## 2015-03-20 MED ORDER — MORPHINE BOLUS VIA INFUSION
5.0000 mg | INTRAVENOUS | Status: DC | PRN
  Filled 2015-03-20: qty 20

## 2015-03-20 MED ORDER — VANCOMYCIN HCL IN DEXTROSE 1-5 GM/200ML-% IV SOLN: 1000.0000 mg | Freq: Once | INTRAVENOUS | Status: DC

## 2015-03-20 MED ORDER — MIDAZOLAM HCL 5 MG/5ML IJ SOLN
INTRAMUSCULAR | Status: AC | PRN
  Administered 2015-03-20: 2 mg via INTRAVENOUS

## 2015-03-20 MED ORDER — CHLORHEXIDINE GLUCONATE 0.12% ORAL RINSE (MEDLINE KIT)
15.0000 mL | Freq: Two times a day (BID) | OROMUCOSAL | Status: DC
  Administered 2015-03-20: 15 mL via OROMUCOSAL

## 2015-03-20 MED ORDER — SODIUM BICARBONATE 8.4 % IV SOLN
Freq: Once | INTRAVENOUS | Status: AC
  Administered 2015-03-20: 15:00:00 via INTRAVENOUS
  Filled 2015-03-20: qty 100

## 2015-03-20 MED ORDER — SODIUM CHLORIDE 0.9 % IV BOLUS (SEPSIS)
1000.0000 mL | INTRAVENOUS | Status: AC
Start: 2015-03-20 — End: 2015-03-20
  Administered 2015-03-20: 1000 mL via INTRAVENOUS

## 2015-03-20 MED ORDER — ALBUTEROL SULFATE (2.5 MG/3ML) 0.083% IN NEBU
10.0000 mg | INHALATION_SOLUTION | Freq: Once | RESPIRATORY_TRACT | Status: AC
  Administered 2015-03-20: 10 mg via RESPIRATORY_TRACT
  Filled 2015-03-20: qty 12

## 2015-03-21 ENCOUNTER — Other Ambulatory Visit: Payer: Self-pay | Admitting: Oncology

## 2015-03-21 ENCOUNTER — Other Ambulatory Visit: Payer: Medicare Other

## 2015-03-21 ENCOUNTER — Other Ambulatory Visit: Payer: Self-pay | Admitting: Nurse Practitioner

## 2015-03-21 ENCOUNTER — Ambulatory Visit: Payer: Medicare Other | Admitting: Oncology

## 2015-03-22 ENCOUNTER — Ambulatory Visit (HOSPITAL_COMMUNITY): Payer: Medicare Other

## 2015-03-22 ENCOUNTER — Telehealth: Payer: Self-pay

## 2015-03-22 LAB — URINE CULTURE: Culture: >=100000

## 2015-03-22 NOTE — Telephone Encounter (Signed)
On 03/22/2015 I received a death certificate from [place]. The death certificate is for cremation. The patient is a patient of Doctor Elsworth Soho. The death certificate will be taken to the pulmonary unit at [facility] for signature this pm. On 04-05-2015 I received the death certificate back from [place] [facility]. I got the death certificate ready for pickup and called the funeral home to let them know the death certificate is ready for pickup.

## 2015-03-23 LAB — CULTURE, BLOOD (ROUTINE X 2)

## 2015-03-23 MED FILL — Medication: Qty: 1 | Status: AC

## 2015-03-26 LAB — CULTURE, BLOOD (ROUTINE X 2): CULTURE: NO GROWTH

## 2015-03-30 ENCOUNTER — Other Ambulatory Visit: Payer: Medicare Other

## 2015-03-30 ENCOUNTER — Ambulatory Visit: Payer: Medicare Other | Admitting: Nurse Practitioner

## 2015-03-30 ENCOUNTER — Ambulatory Visit: Payer: Medicare Other

## 2015-04-05 NOTE — ED Provider Notes (Signed)
CSN: [id number]     Arrival date & time 15-Apr-2015  1345 History   First MD Initiated Contact with Patient Apr 15, 2015 1404     Chief Complaint  Patient presents with  . Cardiac Arrest    Level V caveat (Consider location/radiation/quality/duration/timing/severity/associated sxs/prior Treatment) HPI 69 year old-year-old female with history of breast cancer with known metastases who presents today after prehospital arrest. EMS reports they were called out as to altered level of consciousness. On their arrival she was awake and alert but seemed confused. She then became bradycardic. There were unable to obtain access almost through IO. Her CBG was 53 and she received D50 2 prehospital. They noted wide QRS formation patient became apneic and was intubated in the field Review of records reveal that she was diagnosed with metastatic breast cancer in 2014. She has been on Xeloda but has been holding this. She was most recently seen in clinic on 1012 and at that time had essentially normal labs although her potassium was slightly elevated at 5.7. Past Medical History  Diagnosis Date  . Urinary tract infection   . Anemia   . Breast cancer ([place])     T3N2M1 stage IV invasive ductal carcinoma   . Brain metastases ([place])   . Hydronephrosis, left   . Lung metastases ([place])   . Bone metastases [facility])    Past Surgical History  Procedure Laterality Date  . Cataract extraction  2013  . Mouth surgery  1981   Family History  Problem Relation Age of Onset  . Cancer Paternal Uncle     unknown/possible larynx   Social History  Substance Use Topics  . Smoking status: Never Smoker   . Smokeless tobacco: Never Used  . Alcohol Use: Yes     Comment: 10 12 glasses/week   OB History    No data available     Review of Systems  Unable to perform ROS: Acuity of condition  All other systems reviewed and are negative.     Allergies  Review of patient's allergies indicates no known allergies.  Home  Medications   Prior to Admission medications   Medication Sig Start Date End Date Taking? Authorizing Provider  ALPRAZolam Duanne Moron) 0.5 MG tablet Take 1 tablet (0.5 mg total) by mouth at bedtime as needed for anxiety. Patient not taking: Reported on 08/09/2014 02/03/14   Chauncey Cruel, MD  calcium carbonate (TUMS - DOSED IN MG ELEMENTAL CALCIUM) 500 MG chewable tablet Chew 1 tablet by mouth daily.    Historical Provider, MD  capecitabine (XELODA) 500 MG tablet Take 3 tablets (1,500 mg total) by mouth 2 (two) times daily after a meal. Patient not taking: Reported on 03/16/2015 11/10/14   Chauncey Cruel, MD  darbepoetin (ARANESP) 100 MCG/0.5ML SOLN injection Inject 500 mcg into the skin.     Historical Provider, MD  denosumab (XGEVA) 120 MG/1.7ML SOLN injection Inject 120 mg into the skin every 28 (twenty-eight) days.    Historical Provider, MD  furosemide (LASIX) 20 MG tablet Take 1 tablet (20 mg total) by mouth daily. 03/02/15   Chauncey Cruel, MD  spironolactone (ALDACTONE) 50 MG tablet Take 1 tablet (50 mg total) by mouth daily. 03/16/15   Laurie Panda, NP   BP 113/69 mmHg  Resp 16 Physical Exam  Constitutional: She is oriented to person, place, and time. She appears well-developed.  Cachectic  HENT:  Head: Normocephalic and atraumatic.  Neck: Normal range of motion.  Cardiovascular:  Tachycardic with palpable femoral pulses pulses  Pulmonary/Chest:  Fungating mass left chest wall with bilateral breath sounds Et tube with chocolate colored secretions  Abdominal: Soft. She exhibits distension. There is no tenderness.  Musculoskeletal: Normal range of motion.  Neurological: She is alert and oriented to person, place, and time.  Skin: Skin is warm and dry.  Nursing note and vitals reviewed.   ED Course  Procedures (including critical care time) Labs Review Labs Reviewed  CBG MONITORING, ED - Abnormal; Notable for the following:    Glucose-Capillary 57 (*)    All other  components within normal limits  I-STAT ARTERIAL BLOOD GAS, ED - Abnormal; Notable for the following:    pH, Arterial 6.955 (*)    pO2, Arterial 162.0 (*)    Bicarbonate 9.5 (*)    Acid-base deficit 22.0 (*)    All other components within normal limits  CBC WITH DIFFERENTIAL/PLATELET  AMMONIA  COMPREHENSIVE METABOLIC PANEL  PROTIME-INR  I-STAT CHEM 8, ED  I-STAT TROPOININ, ED  I-STAT CG4 LACTIC ACID, ED    Imaging Review Ct Head Wo Contrast  2015/03/27  CLINICAL DATA:  Altered mental status. Patient had to be intubated. History of metastatic breast carcinoma. EXAM: CT HEAD WITHOUT CONTRAST TECHNIQUE: Contiguous axial images were obtained from the base of the skull through the vertex without intravenous contrast. COMPARISON:  Brain MRI, 01/13/2015. FINDINGS: The ventricles are normal in size and configuration. There are no parenchymal masses or mass effect. There is no evidence of a cortical infarct. The tiny metastatic lesions noted on the prior MRI are not resolved on CT. There are no extra-axial masses or abnormal fluid collections. There is no intracranial hemorrhage. Dependent fluid lies in the right maxillary sinus and right sphenoid sinus. Remaining visualized sinuses are clear. Clear mastoid air cells. IMPRESSION: 1. No acute intracranial abnormalities. 2. No CT evidence of metastatic disease. No intracranial hemorrhage or evidence of an infarct. Electronically Signed   By: Lajean Manes M.D.   On: 2015/03/27 16:54   Dg Chest Portable 1 View  Mar 27, 2015  CLINICAL DATA:  Status post CPR. Endotracheal tube and orogastric tube placed. Patient had metastatic breast cancer. EXAM: PORTABLE CHEST 1 VIEW COMPARISON:  PET-CT, 02/02/2014. FINDINGS: Endotracheal tube tip projects 1.5 cm above the carina, well positioned. Orogastric tube passes below the diaphragm into the stomach. There is airspace consolidation bilaterally. Right hemidiaphragm is not well visualized likely due to a small right  pleural effusion. No gross pneumothorax noted on this supine study. Cardiac silhouette is normal in size. No mediastinal or hilar masses. IMPRESSION: 1. Endotracheal tube and nasogastric tube are well positioned. 2. Airspace lung consolidation. This may reflect edema or diffuse bilateral pneumonia. Electronically Signed   By: Lajean Manes M.D.   On: 03-27-15 14:31   I have personally reviewed and evaluated these images and lab results as part of my medical decision-making.   EKG Interpretation   Date/Time:  03-27-2015 13:53:27 EDT Ventricular Rate:  88 PR Interval:    QRS Duration: 204 QT Interval:  520 QTC Calculation: 629 R Axis:   -83 Text Interpretation:  Atrial fibrillation RBBB and LAFB Probable left  ventricular hypertrophy ED PHYSICIAN INTERPRETATION AVAILABLE IN [facility] Confirmed by TEST, Record ([id number]) on 03/21/2015 6:56:26 AM      MDM   Final diagnoses:  Altered mental status   1 post cardiac arrest patient received multiple epinephrines in the field and was intubated prehospital. She is hyperkalemic and is receiving the hypokalemia protocol. She was given  calcium here. Is currently receiving D50 and insulin. She is also having IV hydration. She has been moving since arrest. This appears to been hypokalemia/bradycardia not V. Fib. PH is 6.95 with a PCO2 of 42 and a PO2 of 162. 2 hyperkalemia 3-renal insufficiency  The patient arrives by herself. It is reported that her husband is in poor health and has not been able to come to hospital. She has no immediate family present  family is on the way from out of town. Discussed with Dr. Dagmar Hait and he is in to assume care.  Patient unlikely to survive but may improve if hyperkalemia and volume rapidly corrected.  I had extensive conversation with husband after his arrival and discussed low chance of survival and acuity of condition.    Pattricia Boss, MD 03/21/15 1318

## 2015-04-05 NOTE — Progress Notes (Signed)
Discussed prognosis with husband [name] & sister [name]. They agreed to limited code - ct current measures , no CPR, no cardioversion, will cap levo @ 25 mcg, will treat hyperkalemia but doubt reversible situation. No HD. ABG reviewed - RR increased to 24, bicarb gtt  Addn cc time x 30 m  Rigoberto Noel. MD

## 2015-04-05 NOTE — ED Notes (Signed)
Pt noted to move bilateral legs.

## 2015-04-05 NOTE — ED Notes (Addendum)
CBG 57 MD aware

## 2015-04-05 NOTE — Discharge Summary (Signed)
PULMONARY / CRITICAL CARE MEDICINE   Name: Vicki Marshall MRN: [id number] DOB: 13-Dec-1945    ADMISSION DATE:  2015-04-07 CONSULTATION DATE:  03/24/2015  REFERRING MD :  Vicki Sparrow, MD  CHIEF COMPLAINT:  Cardiac arrest   STUDIES:  01/2015 MRI brain >> stable metastases 03/11/15 MRI liver >> liver metastases, nodular consistent with cirrhosis  SIGNIFICANT EVENTS: 10/12 >> 7 L paracentesis   HISTORY OF PRESENT ILLNESS:   69 year old with breast cancer metastatic to lungs and brain and cirrhosis with ascites,BIBEMS after being found delirious, hypoglycemic, intubated in the field, cardiac arrest en Route. Found to have hyperkalemia in the ED. Aspiration noted. Previous notes from oncology reviewed. She underwent large volume paracenteses on 10/12 with removal of several liters of fluid. She has been maintained on Lasix and Aldactone for ascites. EMS was called out for altered consciousness. On their arrival are pearly she was awake but confused, she was given D50 for CBG of 53 via IO access Her husband probably has advanced Parkinson's and may be some degree of dementia.    PAST MEDICAL HISTORY :   has a past medical history of Urinary tract infection; Anemia; Breast cancer ([place]); Brain metastases ([place]); Hydronephrosis, left; Lung metastases ([place]); and Bone metastases ([place]).  has past surgical history that includes Cataract extraction (2013) and Mouth surgery (1981). Prior to Admission medications   Medication Sig Start Date End Date Taking? Authorizing Provider  ALPRAZolam Vicki Marshall) 0.5 MG tablet Take 1 tablet (0.5 mg total) by mouth at bedtime as needed for anxiety. Patient not taking: Reported on 08/09/2014 02/03/14   Vicki Cruel, MD  calcium carbonate (TUMS - DOSED IN MG ELEMENTAL CALCIUM) 500 MG chewable tablet Chew 1 tablet by mouth daily.    Historical Provider, MD  capecitabine (XELODA) 500 MG tablet Take 3 tablets (1,500 mg total) by mouth 2 (two) times daily after a  meal. Patient not taking: Reported on 03/16/2015 11/10/14   Vicki Cruel, MD  darbepoetin (ARANESP) 100 MCG/0.5ML SOLN injection Inject 500 mcg into the skin.     Historical Provider, MD  denosumab (XGEVA) 120 MG/1.7ML SOLN injection Inject 120 mg into the skin every 28 (twenty-eight) days.    Historical Provider, MD  furosemide (LASIX) 20 MG tablet Take 1 tablet (20 mg total) by mouth daily. 03/02/15   Vicki Cruel, MD  spironolactone (ALDACTONE) 50 MG tablet Take 1 tablet (50 mg total) by mouth daily. 03/16/15   Vicki Panda, NP    ASSESSMENT / PLAN:  PULMONARY OETT10/16 >> A: Acute respiratory failure related to cardiac arrest P:   Vent  bundle- increase RR to compensate for metab acidosis  CARDIOVASCULAR CVL Rt fem (ED) 10/16 >> A: Cardiac arrest due to hyperkalemia Cardiogenic shock  Lactic acidosis P:  Levophed gtt  RENAL A:  AKI -baseline cr nml Hyperkalemia related to aldactone/AKI  P:   Calcium/ D50- insulin / kayexalate Bicarb gtt   GASTROINTESTINAL A:  Cirrhosis Ascites s/para on 10/12 P:    HEMATOLOGIC A:  Coagulopathy P:  Vit K 10 x 1  INFECTIOUS A:  Aspiration pneumonia  P:   BCx2 10/16 >> e. Coli  -pan S UC 10/16 >> e coli resp 10/16 >> Abx: Zosyn , start date10/16 >> Vanc 10/16 >>  ENDOCRINE A:  Hypoglycemia   P:   D10 & other D5 contianing drips  NEUROLOGIC A:  At risk anoxic encephalopathy P:    Head CT when stable   SUMMARY: Cardiac arrest related to  hyperkalemia in the setting of metastatic cancer and cirrhosis with ascites. The hope here is that if AKI is related to Aldactone, then this may be reversible-but I highly doubt this.  Discussed prognosis with husband Vicki Marshall & sister Vicki Marshall. They agreed to limited code. She passed aways oon after.  Cause of death - Septic shock, UTI, cirrhosis, ascites, AKI/ hyperkalemia, metastatic cancer  Vicki Noel MD Vicki Mead MD. FCCP. [facility] Pager  [phone] If no response call [phone]    03/24/2015, 2:09 PM

## 2015-04-05 NOTE — H&P (Signed)
PULMONARY / CRITICAL CARE MEDICINE   Name: Vicki Marshall MRN: [id number] DOB: 19-Dec-1945    ADMISSION DATE:  Apr 17, 2015 CONSULTATION DATE:  2015-04-17  REFERRING MD :  Jeanell Sparrow, MD  CHIEF COMPLAINT:  Cardiac arrest  INITIAL PRESENTATION: 69 year old with breast cancer metastatic to lungs and brain and cirrhosis with ascites,BIBEMS after being found delirious, hypoglycemic, intubated in the field, cardiac arrest en Route. Found to have hyperkalemia in the ED. Aspiration noted  STUDIES:  01/2015 MRI brain >> stable metastases 03/11/15 MRI liver >> liver metastases, nodular consistent with cirrhosis  SIGNIFICANT EVENTS: 10/12 >> 7 L paracentesis   HISTORY OF PRESENT ILLNESS:   69 year old with breast cancer metastatic to lungs and brain and cirrhosis with ascites,BIBEMS after being found delirious, hypoglycemic, intubated in the field, cardiac arrest en Route. Found to have hyperkalemia in the ED. Aspiration noted. Previous notes from oncology reviewed. She underwent large volume paracenteses on 10/12 with removal of several liters of fluid. She has been maintained on Lasix and Aldactone for ascites. EMS was called out for altered consciousness. On their arrival are pearly she was awake but confused, she was given D50 for CBG of 53 via IO access Her husband probably has advanced Parkinson's and may be some degree of dementia.    PAST MEDICAL HISTORY :   has a past medical history of Urinary tract infection; Anemia; Breast cancer ([place]); Brain metastases ([place]); Hydronephrosis, left; Lung metastases ([place]); and Bone metastases ([place]).  has past surgical history that includes Cataract extraction (2013) and Mouth surgery (1981). Prior to Admission medications   Medication Sig Start Date End Date Taking? Authorizing Provider  ALPRAZolam Duanne Moron) 0.5 MG tablet Take 1 tablet (0.5 mg total) by mouth at bedtime as needed for anxiety. Patient not taking: Reported on 08/09/2014 02/03/14   Chauncey Cruel, MD  calcium carbonate (TUMS - DOSED IN MG ELEMENTAL CALCIUM) 500 MG chewable tablet Chew 1 tablet by mouth daily.    Historical Provider, MD  capecitabine (XELODA) 500 MG tablet Take 3 tablets (1,500 mg total) by mouth 2 (two) times daily after a meal. Patient not taking: Reported on 03/16/2015 11/10/14   Chauncey Cruel, MD  darbepoetin (ARANESP) 100 MCG/0.5ML SOLN injection Inject 500 mcg into the skin.     Historical Provider, MD  denosumab (XGEVA) 120 MG/1.7ML SOLN injection Inject 120 mg into the skin every 28 (twenty-eight) days.    Historical Provider, MD  furosemide (LASIX) 20 MG tablet Take 1 tablet (20 mg total) by mouth daily. 03/02/15   Chauncey Cruel, MD  spironolactone (ALDACTONE) 50 MG tablet Take 1 tablet (50 mg total) by mouth daily. 03/16/15   Laurie Panda, NP   No Known Allergies  FAMILY HISTORY:  indicated that her mother is deceased. She indicated that her father is deceased.  SOCIAL HISTORY:  reports that she has never smoked. She has never used smokeless tobacco. She reports that she drinks alcohol. She reports that she does not use illicit drugs.  REVIEW OF SYSTEMS:  Unable to obtain  SUBJECTIVE:   VITAL SIGNS: Temp:  [95.5 F (35.3 C)] 95.5 F (35.3 C) (10/16 1417) Pulse Rate:  [57-104] 83 (10/16 1500) Resp:  [15-20] 15 (10/16 1500) BP: (84-113)/(51-72) 84/51 mmHg (10/16 1500) SpO2:  [80 %-100 %] 100 % (10/16 1500) FiO2 (%):  [100 %] 100 % (10/16 1400) HEMODYNAMICS:   VENTILATOR SETTINGS: Vent Mode:  [-] PRVC FiO2 (%):  [100 %] 100 % Set Rate:  [16 bmp] 16  bmp Vt Set:  [400 mL] 400 mL PEEP:  [5 cmH20] 5 cmH20 Plateau Pressure:  [18 cmH20] 18 cmH20 INTAKE / OUTPUT:  Intake/Output Summary (Last 24 hours) at April 10, 2015 1546 Last data filed at 2015-04-10 1433  Gross per 24 hour  Intake      0 ml  Output     60 ml  Net    -60 ml    PHYSICAL EXAMINATION: Gen. Cachectic chronically ill, in moderate distress ENT - no lesions, no post  nasal drip, orally intubated, scleral icterus Neck: No JVD, no thyromegaly, no carotid bruits Breast-fungating mass over her left breast, dilated veins Lungs: no use of accessory muscles, no dullness to percussion, clear without rales or rhonchi  Cardiovascular: Rhythm regular, heart sounds  normal, no murmurs, no peripheral edema Abdomen: soft and non-tender, mildly distended no hepatosplenomegaly, BS normal. Musculoskeletal: No deformities, no cyanosis or clubbing Neuro: Comatose, pupils 3 mm reactive to light Skin:  Cool, no lesions/ rash   LABS:  CBC  Recent Labs Lab 03/16/15 1321 10-Apr-2015 1414 2015/04/10 1416 04/10/15 1501  WBC 5.4 6.2  --   --   HGB 11.7 9.2* 10.9* 12.9  HCT 36.5 30.6* 32.0* 38.0  PLT 117* PENDING  --   --    Coag's No results for input(s): APTT, INR in the last 168 hours. BMET  Recent Labs Lab 03/16/15 1322 10-Apr-2015 1414 04/10/15 1416 April 10, 2015 1501  NA 132* 128* 128* 131*  K 5.3* >7.5* 7.5* 7.6*  CL  --  109 111 110  CO2 16* 11*  --   --   BUN 36.1* 77* 79* 86*  CREATININE 1.1 2.98* 2.90* 3.00*  GLUCOSE 96 500* 488* 198*   Electrolytes  Recent Labs Lab 03/16/15 1322 04-10-2015 1414  CALCIUM 7.3* 12.0*   Sepsis Markers  Recent Labs Lab 2015-04-10 1415 Apr 10, 2015 1501  LATICACIDVEN 8.52* 8.61*   ABG  Recent Labs Lab 2015/04/10 1411  PHART 6.955*  PCO2ART 42.8  PO2ART 162.0*   Liver Enzymes  Recent Labs Lab 03/16/15 1322 04/10/2015 1414  AST 190* 1264*  ALT 56* 170*  ALKPHOS 640* 410*  BILITOT 6.02* 4.7*  ALBUMIN 1.9* 1.0*   Cardiac Enzymes No results for input(s): TROPONINI, PROBNP in the last 168 hours. Glucose  Recent Labs Lab 04-10-2015 1351 2015/04/10 1417 04/10/15 1432 Apr 10, 2015 1443  GLUCAP 57* 190* 219* 227*    Imaging Dg Chest Portable 1 View  2015-04-10  CLINICAL DATA:  Status post CPR. Endotracheal tube and orogastric tube placed. Patient had metastatic breast cancer. EXAM: PORTABLE CHEST 1 VIEW  COMPARISON:  PET-CT, 02/02/2014. FINDINGS: Endotracheal tube tip projects 1.5 cm above the carina, well positioned. Orogastric tube passes below the diaphragm into the stomach. There is airspace consolidation bilaterally. Right hemidiaphragm is not well visualized likely due to a small right pleural effusion. No gross pneumothorax noted on this supine study. Cardiac silhouette is normal in size. No mediastinal or hilar masses. IMPRESSION: 1. Endotracheal tube and nasogastric tube are well positioned. 2. Airspace lung consolidation. This may reflect edema or diffuse bilateral pneumonia. Electronically Signed   By: Lajean Manes M.D.   On: 2015/04/10 14:31     ASSESSMENT / PLAN:  PULMONARY OETT10/16 >> A: Acute respiratory failure related to cardiac arrest P:   Vent  bundle- increase RR to compensate for metab acidosis  CARDIOVASCULAR CVL Rt fem (ED) 10/16 >> A: Cardiac arrest due to hyperkalemia Cardiogenic shock  Lactic acidosis P:  Levophed gtt  RENAL A:  AKI -baseline cr nml Hyperkalemia related to aldactone/AKI  P:   Calcium/ D50- insulin / kayexalate Bicarb gtt Rpt ABG / BMET in 3h  GASTROINTESTINAL A:  Cirrhosis Ascites s/para on 10/12 P:   npo  HEMATOLOGIC A:  Coagulopathy P:  Vit K 10 x 1  INFECTIOUS A:  Aspiration pneumonia  P:   BCx2 10/16 >> UC 10/16 >> resp 10/16 >> Abx: Zosyn , start date10/16 >> Vanc 10/16 >>  ENDOCRINE A:  Hypoglycemia   P:   D10 & other D5 contianing drips  NEUROLOGIC A:  At risk anoxic encephalopathy P:   RASS goal: 0 Head CT when stable   FAMILY  - Updates: Husband has parkinson's & unable to make decisions-await arrival of [place]ster  - Inter-disciplinary family meet or Palliative Care meeting due by:  day 7    TODAY'S SUMMARY: Cardiac arrest related to hyperkalemia in the setting of metastatic cancer and cirrhosis with ascites. The hope here is that if AKI is related to Aldactone, then this may be reversible-but I  highly doubt this. Suspect a very poor prognosis and would advocate for DO NOT RESUSCITATE status   The patient is critically ill with multiple organ systems failure and requires high complexity decision making for assessment and support, frequent evaluation and titration of therapies, application of advanced monitoring technologies and extensive interpretation of multiple databases. Critical Care Time devoted to patient care services described in this note independent of APP time is 45 minutes.   Rigoberto Noel MD Kara Mead MD. FCCP. [place] [facility] Pager [phone] If no response call [phone]    03-28-15, 3:46 PM

## 2015-04-05 NOTE — ED Notes (Signed)
Dr. Elsworth Soho at bedside.

## 2015-04-05 NOTE — Progress Notes (Addendum)
Sepsis - Repeat Assessment  Performed at:    1900  Vitals     Blood pressure 75/52, pulse 75, temperature 88.2 F (31.2 C), temperature source Core (Comment), resp. rate 24, SpO2 94 %.  Heart:     Regular rate and rhythm  Lungs:    CTA  Capillary Refill:   > 2 sec  Peripheral Pulse:   Radial pulse palpable  Skin:     jaundiced   Lactic acid rising 8.6 > 9.9  Plan: Continue current therapy as outlined in family conversation with Dr. Elsworth Soho earlier today. No CPR, no defib/cardioversion, Levo cap 95mcg.   Georgann Housekeeper, AGACNP-BC [facility] Pulmonology/Critical Care Pager [phone] or [phone]  04/03/2015 7:05 PM

## 2015-04-05 NOTE — Progress Notes (Signed)
Patient expired at 2218.  Morphine drip 240cc. wasted in sink witnessed by Anselm Pancoast, RN.  All patient belongings given to husband and taken home.

## 2015-04-05 NOTE — ED Provider Notes (Signed)
CENTRAL LINE Performed by: Pattricia Boss  MD / Lenn Sink PA-C Consent: The procedure was performed in an emergent situation. Required items: required blood products, implants, devices, and special equipment available Patient identity confirmed: arm band and provided demographic data Time out: Immediately prior to procedure a "time out" was called to verify the correct patient, procedure, equipment, support staff and site/side marked as required. Indications: vascular access Anesthesia: local infiltration Local anesthetic: lidocaine 2% with epinephrine Anesthetic total: 3 ml Patient sedated: no Preparation: skin prepped with 2% chlorhexidine Skin prep agent dried: skin prep agent completely dried prior to procedure Sterile barriers: all five maximum sterile barriers used - cap, mask, sterile gown, sterile gloves, and large sterile sheet Hand hygiene: hand hygiene performed prior to central venous catheter insertion  Location details: right femoral vein   Catheter type: triple lumen Catheter size: 8 Fr Pre-procedure: landmarks identified Ultrasound guidance: none Successful placement: yes Post-procedure: line sutured and dressing applied Assessment: blood return through all parts, free fluid flow, placement verified by x-ray and no pneumothorax on x-ray Patient tolerance: Patient tolerated the procedure well with no immediate complications.  Okey Regal, PA-C April 11, 2015 1643  Pattricia Boss, MD 03/21/15 [phone]

## 2015-04-05 NOTE — ED Notes (Addendum)
Per EMS- initial call was unresponsive. Pt was found conscious but altered. IO was started due to difficult IV. Pt BP 60 and bradycardic. Pt CBG 53 d50 x2 given. Pt then coded with EMS no meds or defibrillation during transport.

## 2015-04-05 NOTE — Procedures (Signed)
Extubation Procedure Note  Patient Details:   Name: Vicki Marshall DOB: 05-15-1946 MRN: [id number]   Airway Documentation:  Airway 23 mm (Active)  Secured at (cm) 23 cm 04-06-15  7:43 PM  Measured From Lips 06-Apr-2015  7:43 PM  Secured Location Right 06-Apr-2015  7:43 PM  Secured By [employer] 04/06/15  7:43 PM  Tube Holder Repositioned Yes 04-06-2015  7:43 PM  Site Condition Dry 04-06-2015  7:43 PM    Evaluation  O2 sats: stable throughout Complications: No apparent complications Patient did tolerate procedure well. Bilateral Breath Sounds: Rhonchi Suctioning: Airway No   Patient was terminally extubated.  [name] Apr 06, 2015, 10:04 PM

## 2015-04-05 NOTE — ED Notes (Signed)
MD at bedside. 

## 2015-04-05 NOTE — Progress Notes (Signed)
Patient came in intubated by EMS with a 7.0 ETT taped at 23 cm at lips, good BBS ausculted, SATS 100% placed on above settings. MD aware.

## 2015-04-05 NOTE — Progress Notes (Signed)
CRITICAL VALUE ALERT  Critical value received:  K 6.1 , Ca 6.4 , Glucose 358   Date of notification:  03-28-2015  Time of notification:  1811  Critical value read back:Yes.    Nurse who received alert:  Sanjuana Kava, RN  MD notified (1st page):  Dr. Emmit Alexanders  Time of first page:  1811  Responding MD:  Dr. Emmit Alexanders  Time MD responded:  [phone]

## 2015-04-05 NOTE — Progress Notes (Signed)
ANTIBIOTIC CONSULT NOTE - INITIAL  Pharmacy Consult for vancomycin  Indication: pneumonia  No Known Allergies  Patient Measurements:   Adjusted Body Weight:   Vital Signs: Temp: 95.5 F (35.3 C) (10/16 1417) Temp Source: Temporal (10/16 1417) BP: 113/69 mmHg (10/16 1400) Intake/Output from previous day:   Intake/Output from this shift: Total I/O In: -  Out: 60 [Urine:60]  Labs:  Recent Labs  03-26-15 1416  HGB 10.9*  CREATININE 2.90*   Estimated Creatinine Clearance: 17.1 mL/min (by C-G formula based on Cr of 2.9). No results for input(s): VANCOTROUGH, VANCOPEAK, VANCORANDOM, GENTTROUGH, GENTPEAK, GENTRANDOM, TOBRATROUGH, TOBRAPEAK, TOBRARND, AMIKACINPEAK, AMIKACINTROU, AMIKACIN in the last 72 hours.   Microbiology: Recent Results (from the past 720 hour(s))  TECHNOLOGIST REVIEW     Status: None   Collection Time: 03/02/15 11:26 AM  Result Value Ref Range Status   Technologist Review   Final    rare meta & myelo. 3% nrbc. sl poly. few teardrops, ovalos, shistocytes.    Medical History: Past Medical History  Diagnosis Date  . Urinary tract infection   . Anemia   . Breast cancer ([place])     T3N2M1 stage IV invasive ductal carcinoma   . Brain metastases ([place])   . Hydronephrosis, left   . Lung metastases ([place])   . Bone metastases ([facility])     Medications:   (Not in a hospital admission)   Assessment: 69 yo female with metastatic breast cancer, s/p cardiac arrest. Multiple electrolyte derangement K 7.5, iCa 1.8, Na 128, SCr 1.1 > 2.9, eCrCl 15-20 ml/min. Zosyn x1 in ED.  Goal of Therapy:  Vancomycin trough level 15-20 mcg/ml  Plan:  -Vancomycin 1250 mg IV x1 -Trend Scr -F/u cultures    Hughes Better, PharmD, BCPS Clinical Pharmacist Pager: [phone] 03-26-2015 2:40 PM

## 2015-04-05 NOTE — Progress Notes (Signed)
[facility] Progress Note Patient Name: Vicki Marshall DOB: 26-Jul-1945 MRN: [id number]   Date of Service  04-02-15  HPI/Events of Note  Spoke with the patient's husband, Vicki Marshall, who relates that given the patient's advanced cancer and current clinic state. Hypoxia and hypotension in spite of aggressive management, that the family wishes to withdraw aggressive support and make the patient comfort measures and allow her to pass with dignity and comfort. After review of the chart, I have no objection to this course for action.  eICU Interventions  Will make the patient DNR/Comfort Measures, withdraw mechanical ventilation, vasopressores and place the patient on a morphine IV infusion when the family is ready.     Intervention Category Intermediate Interventions: Communication with other healthcare providers and/or family  Lysle Dingwall 02-Apr-2015, 9:32 PM

## 2015-04-05 NOTE — Progress Notes (Signed)
[facility] Progress Note Patient Name: Vicki Marshall DOB: May 31, 1946 MRN: [id number]   Date of Service  25-Mar-2015  HPI/Events of Note  Multiple issues: 1. K+ = 7.6 >> 6.1. Patient ordered to receive Kayexalate. 2. Ca++ = 6.4 and albumin = 1.0. Ca++ corrects to 8.8 given albumin level = 1.0 (normal). 3. Glucose = 358. D10 IV infusion D/Ced.  eICU Interventions  Will order: 1. BMP at 12 midnight to follow resolution of hyperkalemia and hyperglycemia.      Intervention Category Major Interventions: Electrolyte abnormality - evaluation and management  Belvia Gotschall [place] March 25, 2015, 6:19 PM

## 2015-04-05 DEATH — deceased

## 2015-11-19 ENCOUNTER — Other Ambulatory Visit: Payer: Self-pay | Admitting: Nurse Practitioner

## 2016-04-01 IMAGING — MR MR HEAD WO/W CM
9 of 11 series · 28 of 48 positions shown · IV contrast (multihance)
Comparison: 06/28/2014

CLINICAL DATA: Followup metastatic breast cancer.

EXAM:
MRI HEAD WITHOUT AND WITH CONTRAST
TECHNIQUE: Multiplanar, multiecho pulse sequences of the brain and surrounding
structures were obtained without and with intravenous contrast.
CONTRAST:  1 MULTIHANCE GADOBENATE DIMEGLUMINE 529 MG/ML IV SOLN

[Series 3: FLAIR · sagittal · 3.0mm · 0.47mm/px · 2 of 33 slices shown (1 of 3)]
[im 1/33]
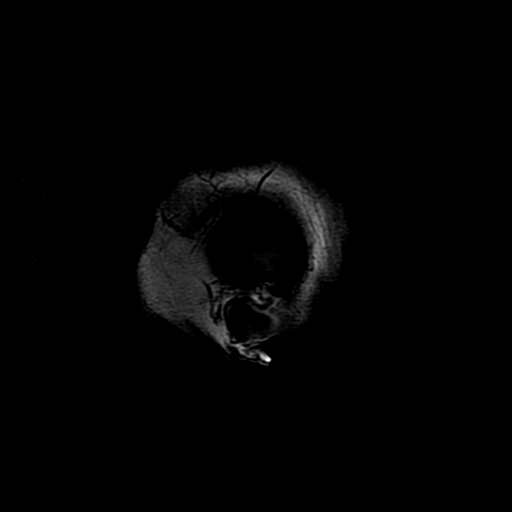
[im 33/33]
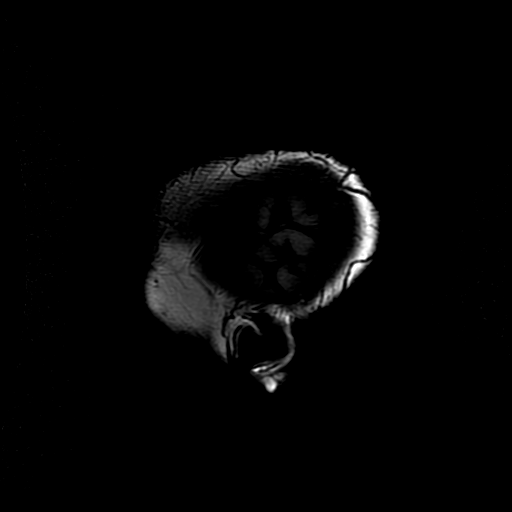

[Series 4: T2-star · axial · 5.0mm · 0.43mm/px · z∈[-90,+71]mm · 2 of 28 slices shown]
[im 1/28]
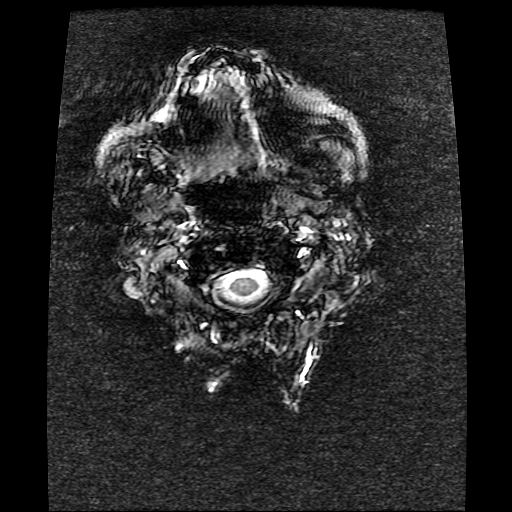
[im 28/28]
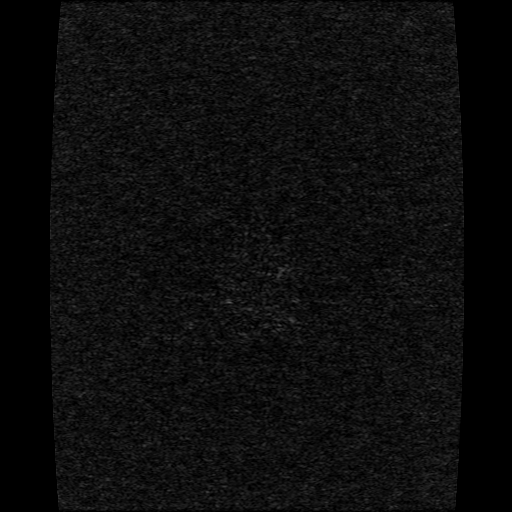

[Series 5: (id) · axial · 5.0mm · 0.43mm/px · z∈[-90,+71]mm · 2 of 28 slices shown]
[im 1/28]
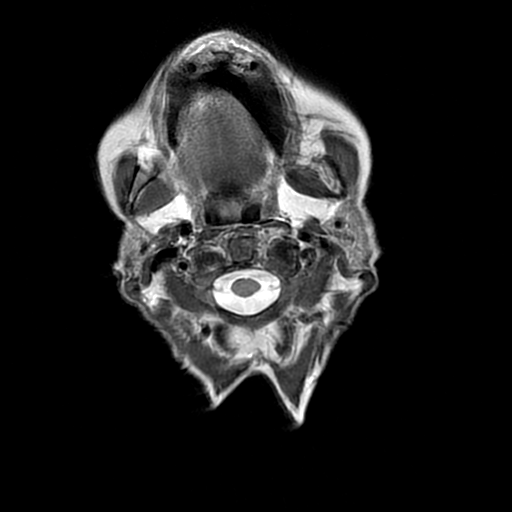
[im 28/28]
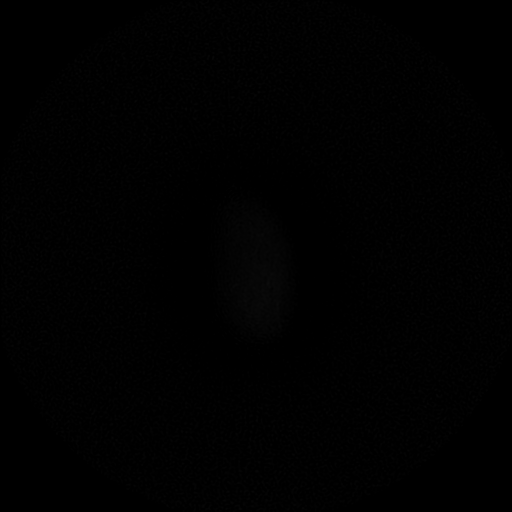

[Series 6: DWI · axial · 3.0mm · 1.09mm/px · z∈[-72,+66]mm · 6 of 94 slices shown (1 of 2)]
[im 1/94]
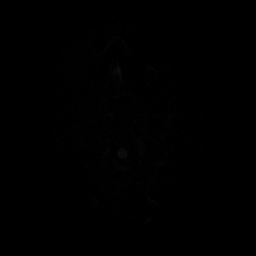
[im 19/94]
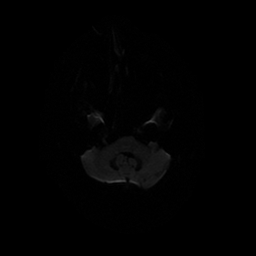
[im 38/94]
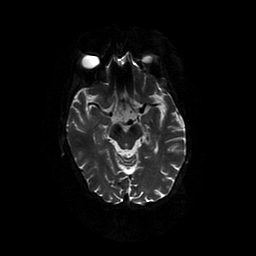
[im 56/94]
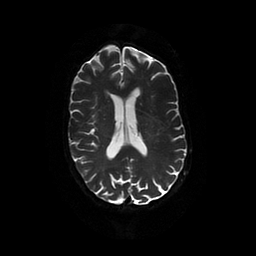
[im 75/94]
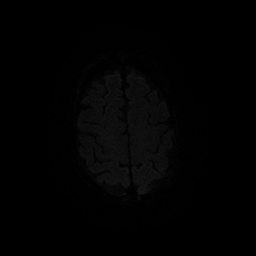
[im 94/94]
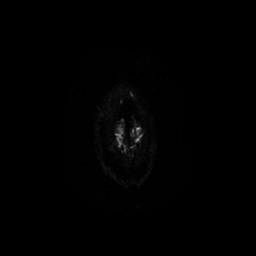

[Series 7: FLAIR · axial · 1.0mm · 0.86mm/px · z∈[-83,+65]mm · 5 of 75 slices shown (2 of 3)]
[im 1/75]
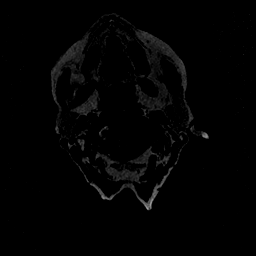
[im 19/75]
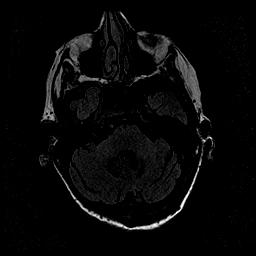
[im 38/75]
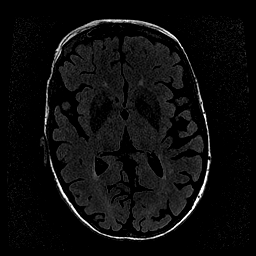
[im 56/75]
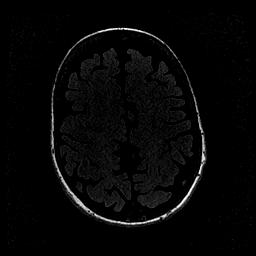
[im 75/75]
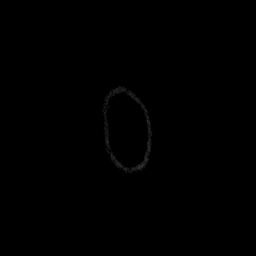

[Series 9: T2 post-contrast · coronal · 3.0mm · 0.43mm/px · 3 of 43 slices shown]
[im 1/43]
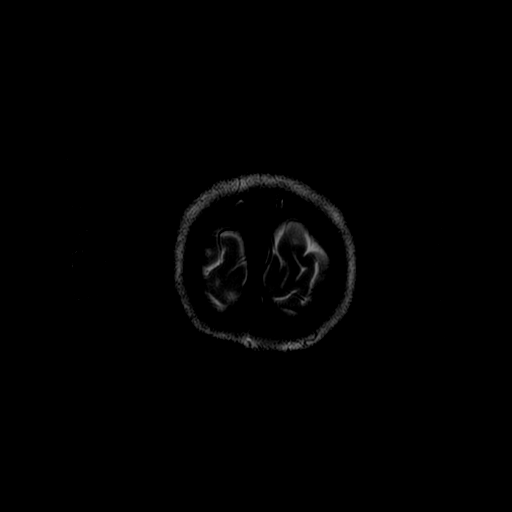
[im 22/43]
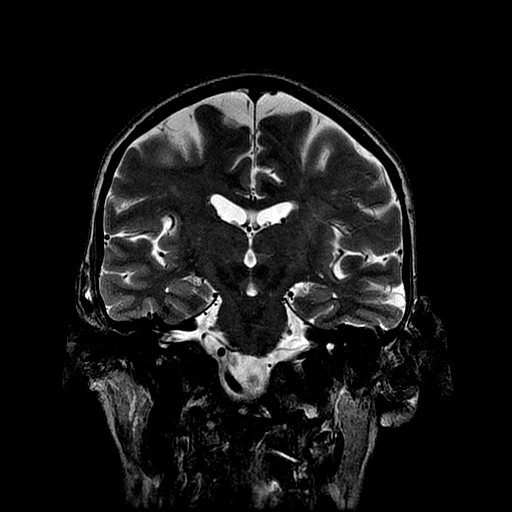
[im 43/43]
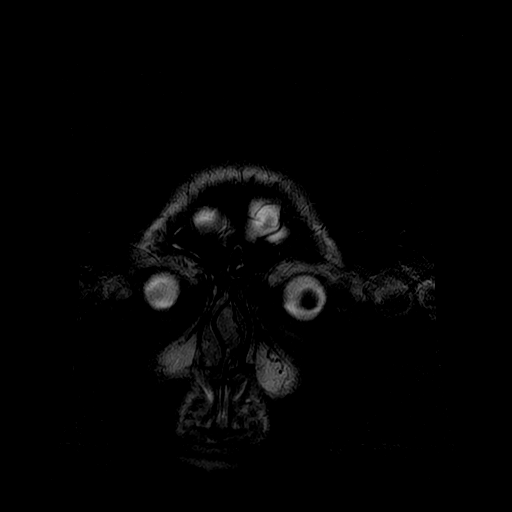

[Series 11: T1 · coronal · 3.0mm · 0.43mm/px · 3 of 43 slices shown]
[im 1/43]
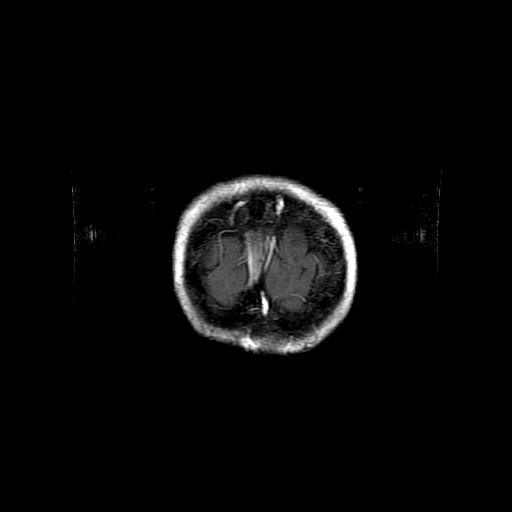
[im 22/43]
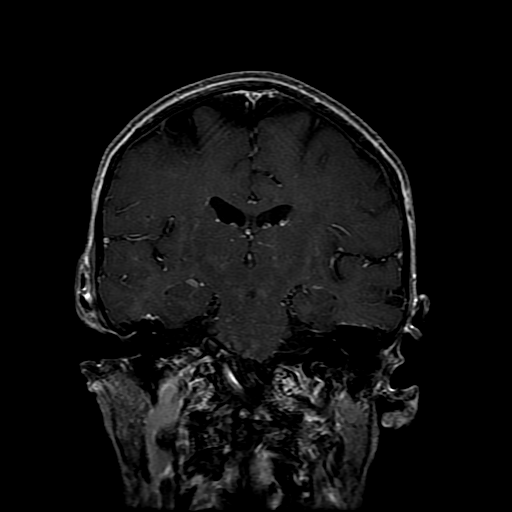
[im 43/43]
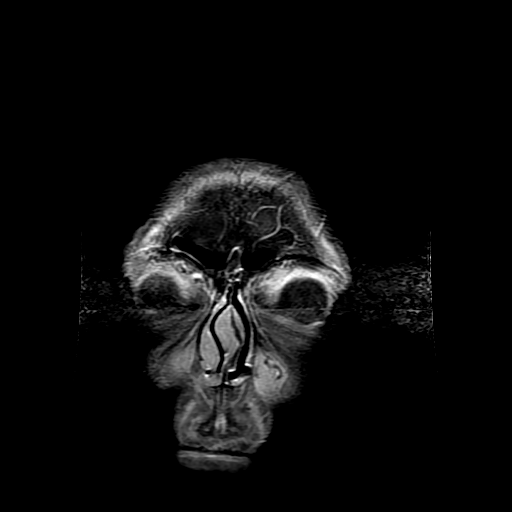

[Series 12: FLAIR · sagittal · 3.0mm · 0.47mm/px · 2 of 33 slices shown (3 of 3)]
[im 1/33]
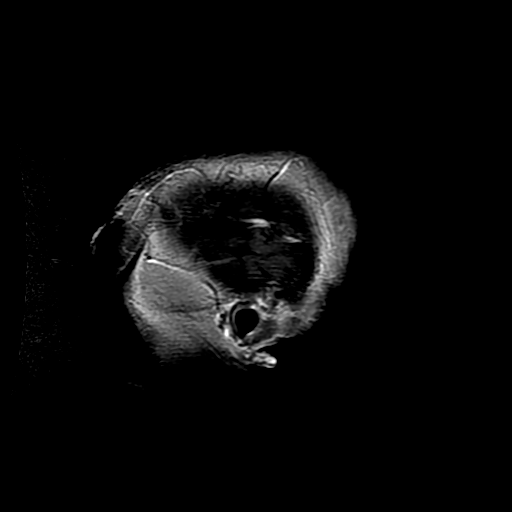
[im 33/33]
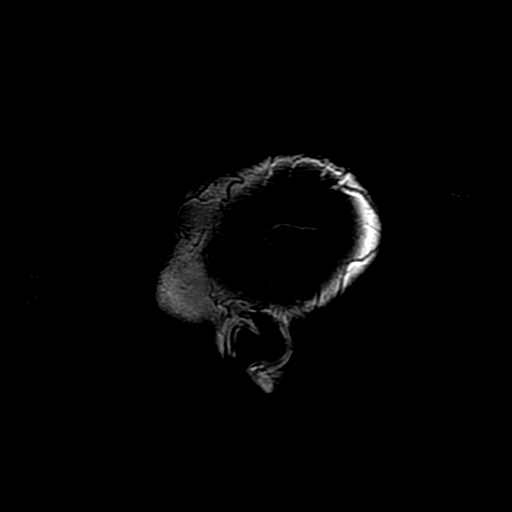

[Series 600: DWI · axial · 3.0mm · 1.09mm/px · z∈[-72,+66]mm · 3 of 47 slices shown (2 of 2)]
[im 1/47]
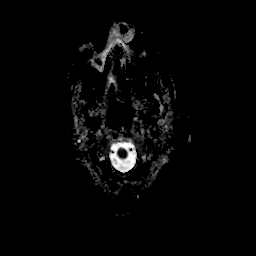
[im 24/47]
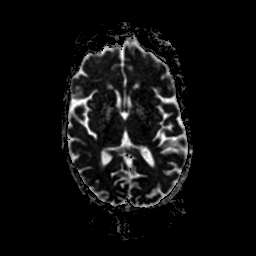
[im 47/47]
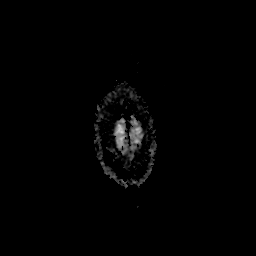

[28 of 48 positions shown; findings below may reference images not displayed]

FINDINGS: Previously seen enhancing focus in the left cerebellum has decreased
in size further, from 4 mm to 3 mm. No new cerebellar lesion.

Previously seen right parietal metastasis, image 98, is decreased in
size from 4 mm to 2 mm. Previously seen 2 mm metastasis in the right
parietal region, image 101, is barely discernible.

No new or progressive lesions anywhere else.

Right frontal venous angioma again incidentally noted.

No acute or subacute infarction. Mild chronic white matter changes.
No hemorrhage, hydrocephalus or extra-axial collection. No pituitary
mass. No skull or skullbase lesion. No inflammatory sinus disease.
IMPRESSION: Three identifiable punctate foci of enhancement, 1 in the left
cerebellum into in the right parietal region, all getting smaller
compared to the previous study. No new or progressive lesions.

## 2016-09-11 IMAGING — CT CT HEAD W/O CM
2 series · 16 of 30 positions shown, 20 images · non-contrast
Comparison: Brain MRI, 01/13/2015.

CLINICAL DATA: Altered mental status. Patient had to be intubated.
History of metastatic breast carcinoma.

EXAM:
CT HEAD WITHOUT CONTRAST
TECHNIQUE: Contiguous axial images were obtained from the base of the skull
through the vertex without intravenous contrast.

[Series 201: head w/o, idose (1) · axial · non-contrast · 0.42mm/px · z∈[+53,+183]mm · 13 of 32 slices shown, 17 images]
[im 3/32  brain]
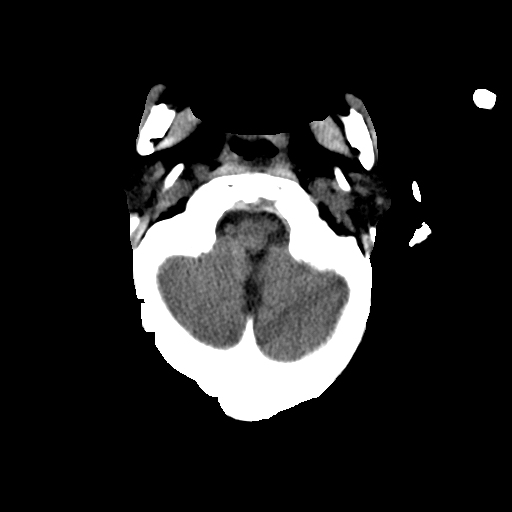
[im 3/32  bone]
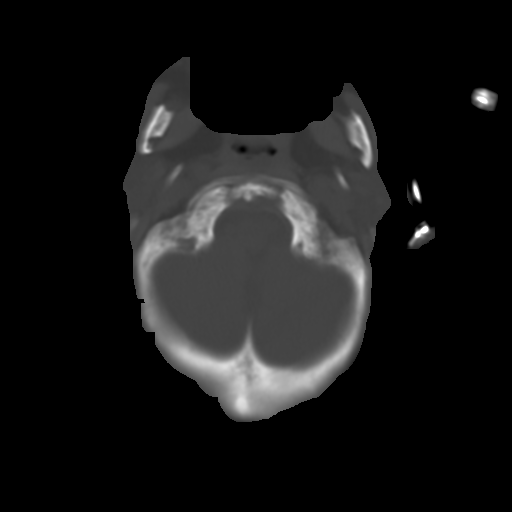
[im 5/32  brain]
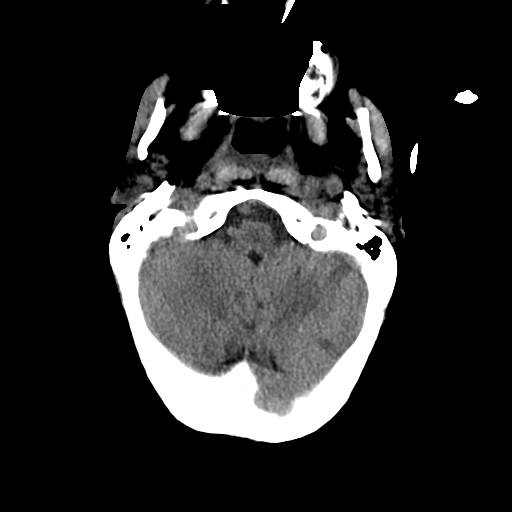
[im 7/32  brain]
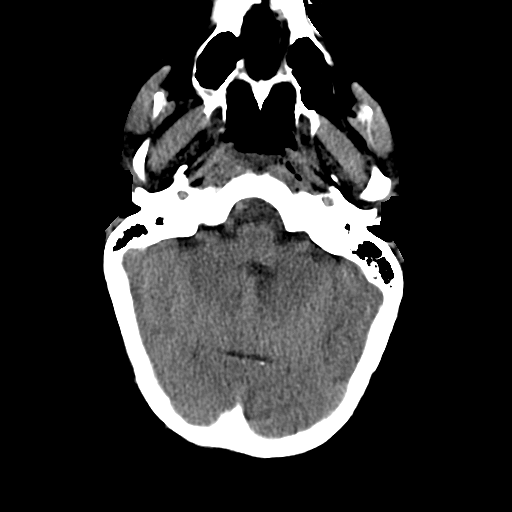
[im 9/32  brain]
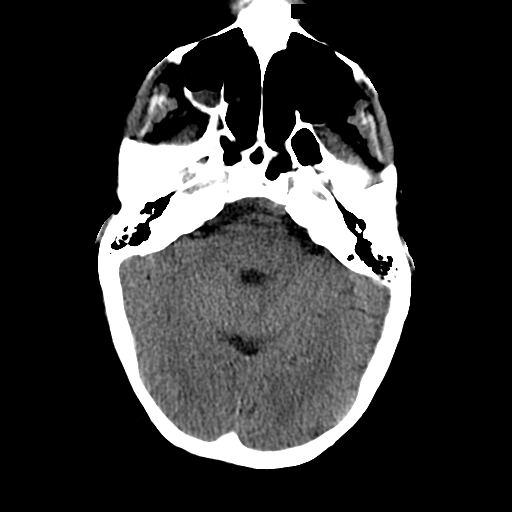
[im 12/32  brain]
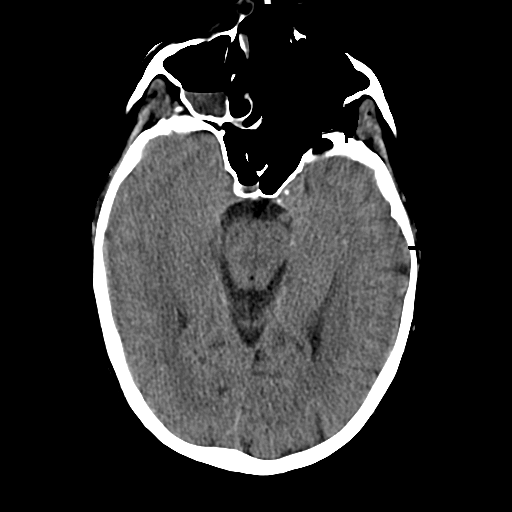
[im 12/32  bone]
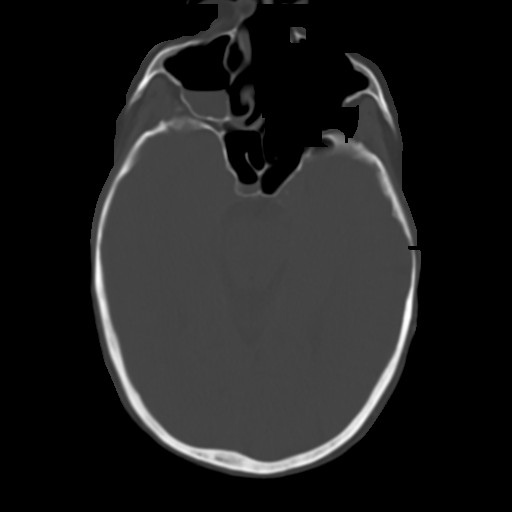
[im 14/32  brain]
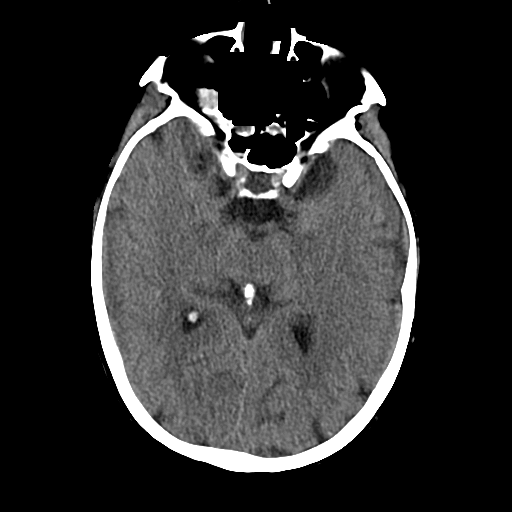
[im 16/32  brain]
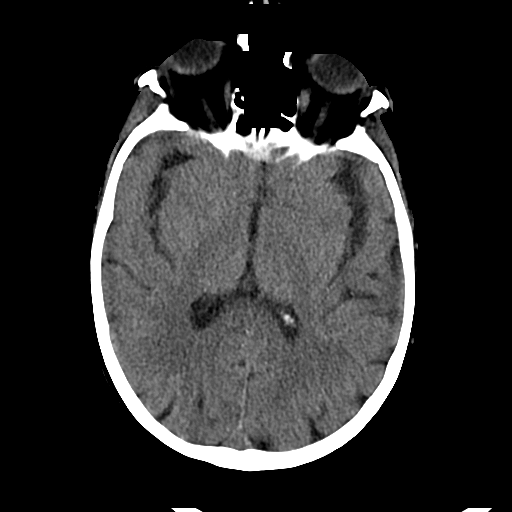
[im 18/32  brain]
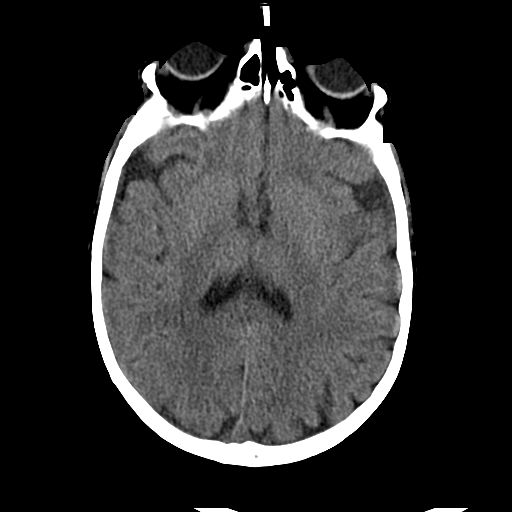
[im 20/32  brain]
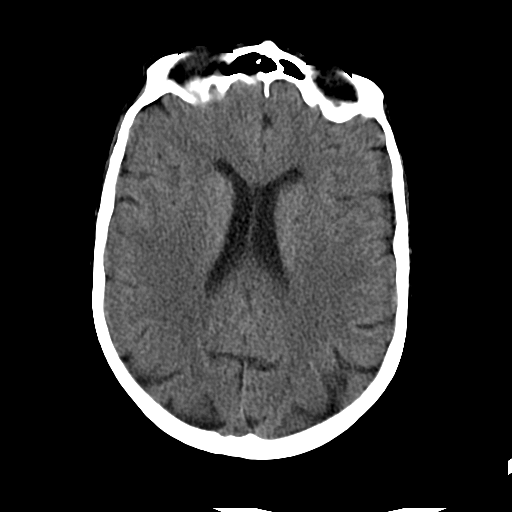
[im 20/32  bone]
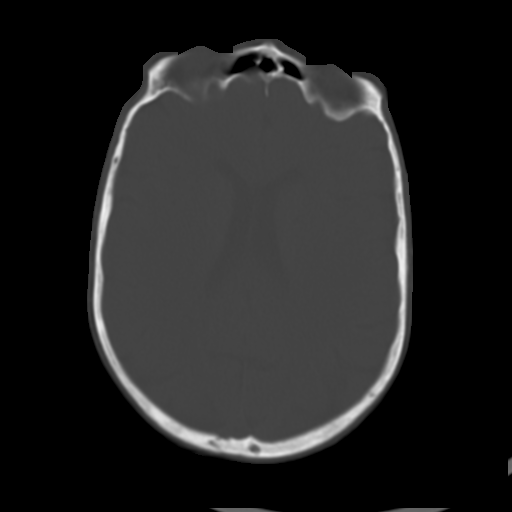
[im 23/32  brain]
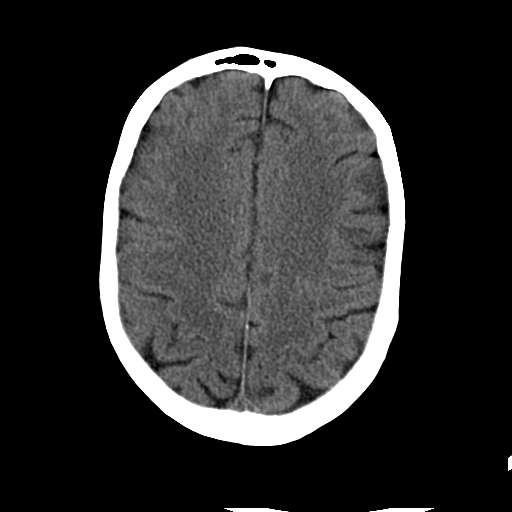
[im 25/32  brain]
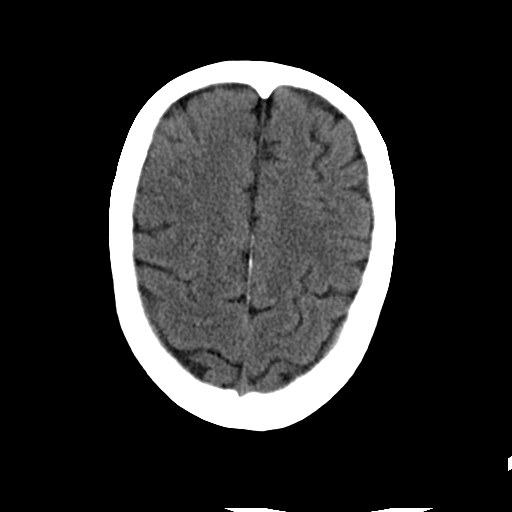
[im 27/32  brain]
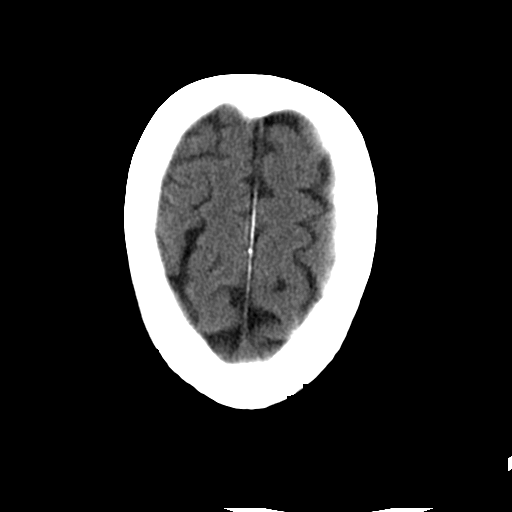
[im 29/32  brain]
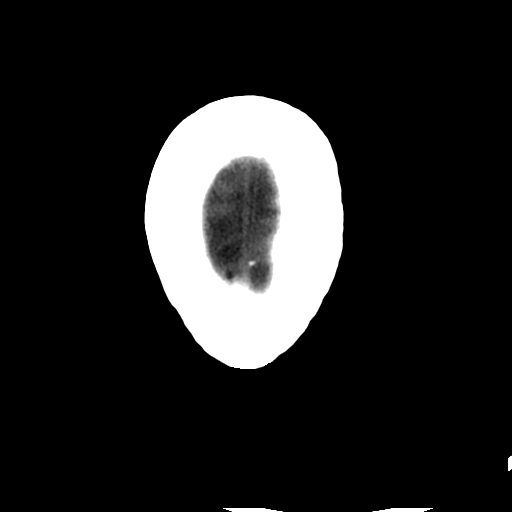
[im 29/32  bone]
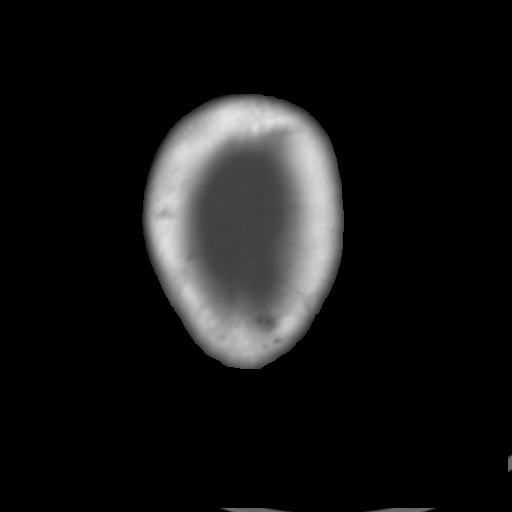

[Series 202: head w/o bone, idose (1) · axial · non-contrast · 0.42mm/px · z∈[+53,+98]mm · 3 of 32 slices shown]
[im 3/32  bone]
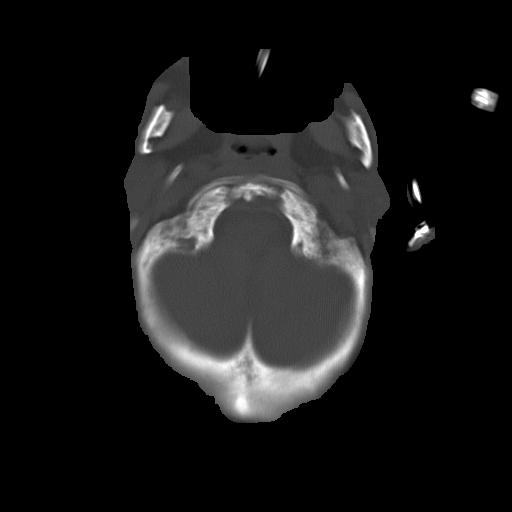
[im 7/32  bone]
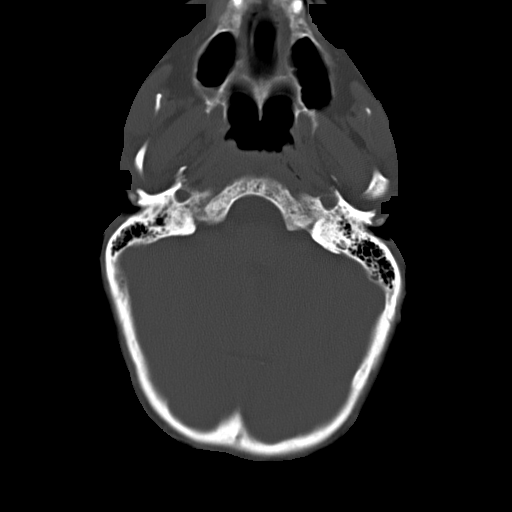
[im 12/32  bone]
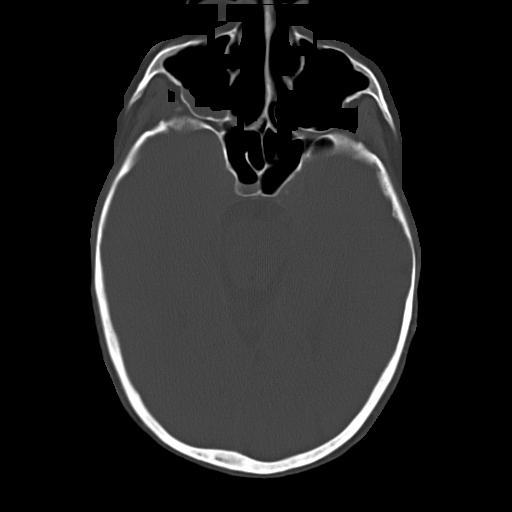

[16 of 30 positions shown; findings below may reference images not displayed]

FINDINGS: The ventricles are normal in size and configuration. There are no
parenchymal masses or mass effect. There is no evidence of a
cortical infarct. The tiny metastatic lesions noted on the prior MRI
are not resolved on CT.

There are no extra-axial masses or abnormal fluid collections.

There is no intracranial hemorrhage.

Dependent fluid lies in the right maxillary sinus and right sphenoid
sinus. Remaining visualized sinuses are clear. Clear mastoid air
cells.
IMPRESSION: 1. No acute intracranial abnormalities.
2. No CT evidence of metastatic disease. No intracranial hemorrhage
or evidence of an infarct.
